# Patient Record
Sex: Male | Born: 1960 | Race: White | Smoking: Former smoker
Health system: Southern US, Community
[De-identification: ages and names within clinical notes are randomized; demographics above are authoritative.]

## PROBLEM LIST (undated history)

## (undated) DIAGNOSIS — I10 Essential (primary) hypertension: Secondary | ICD-10-CM

## (undated) DIAGNOSIS — H269 Unspecified cataract: Secondary | ICD-10-CM

## (undated) DIAGNOSIS — E785 Hyperlipidemia, unspecified: Secondary | ICD-10-CM

## (undated) DIAGNOSIS — E119 Type 2 diabetes mellitus without complications: Secondary | ICD-10-CM

## (undated) DIAGNOSIS — F329 Major depressive disorder, single episode, unspecified: Secondary | ICD-10-CM

## (undated) DIAGNOSIS — K219 Gastro-esophageal reflux disease without esophagitis: Secondary | ICD-10-CM

## (undated) DIAGNOSIS — F419 Anxiety disorder, unspecified: Secondary | ICD-10-CM

## (undated) HISTORY — DX: Anxiety disorder, unspecified: F41.9

## (undated) HISTORY — DX: Essential (primary) hypertension: I10

## (undated) HISTORY — DX: Gastro-esophageal reflux disease without esophagitis: K21.9

## (undated) HISTORY — DX: Type 2 diabetes mellitus without complications: E11.9

## (undated) HISTORY — DX: Hyperlipidemia, unspecified: E78.5

## (undated) HISTORY — DX: Major depressive disorder, single episode, unspecified: F32.9

## (undated) HISTORY — DX: Unspecified cataract: H26.9

---

## 1998-01-27 DIAGNOSIS — F32A Depression, unspecified: Secondary | ICD-10-CM

## 1998-01-27 DIAGNOSIS — F419 Anxiety disorder, unspecified: Secondary | ICD-10-CM

## 1998-01-27 HISTORY — DX: Anxiety disorder, unspecified: F41.9

## 1998-01-27 HISTORY — DX: Depression, unspecified: F32.A

## 1999-12-29 DIAGNOSIS — E119 Type 2 diabetes mellitus without complications: Secondary | ICD-10-CM

## 1999-12-29 HISTORY — DX: Type 2 diabetes mellitus without complications: E11.9

## 2004-02-28 DIAGNOSIS — H269 Unspecified cataract: Secondary | ICD-10-CM

## 2004-02-28 HISTORY — DX: Unspecified cataract: H26.9

## 2006-11-28 DIAGNOSIS — E785 Hyperlipidemia, unspecified: Secondary | ICD-10-CM

## 2006-11-28 HISTORY — DX: Hyperlipidemia, unspecified: E78.5

## 2015-04-14 ENCOUNTER — Ambulatory Visit (INDEPENDENT_AMBULATORY_CARE_PROVIDER_SITE_OTHER): Admitting: Family Medicine

## 2015-04-14 ENCOUNTER — Encounter: Payer: Self-pay | Admitting: Family Medicine

## 2015-04-14 VITALS — BP 118/83 | HR 65 | Temp 99.1°F | Ht 72.0 in | Wt 259.6 lb

## 2015-04-14 DIAGNOSIS — F329 Major depressive disorder, single episode, unspecified: Secondary | ICD-10-CM | POA: Insufficient documentation

## 2015-04-14 DIAGNOSIS — M5136 Other intervertebral disc degeneration, lumbar region: Secondary | ICD-10-CM | POA: Diagnosis not present

## 2015-04-14 DIAGNOSIS — E119 Type 2 diabetes mellitus without complications: Secondary | ICD-10-CM

## 2015-04-14 DIAGNOSIS — F419 Anxiety disorder, unspecified: Secondary | ICD-10-CM

## 2015-04-14 DIAGNOSIS — I10 Essential (primary) hypertension: Secondary | ICD-10-CM

## 2015-04-14 DIAGNOSIS — Z1211 Encounter for screening for malignant neoplasm of colon: Secondary | ICD-10-CM

## 2015-04-14 DIAGNOSIS — E1159 Type 2 diabetes mellitus with other circulatory complications: Secondary | ICD-10-CM | POA: Insufficient documentation

## 2015-04-14 DIAGNOSIS — R300 Dysuria: Secondary | ICD-10-CM | POA: Diagnosis not present

## 2015-04-14 DIAGNOSIS — K219 Gastro-esophageal reflux disease without esophagitis: Secondary | ICD-10-CM | POA: Diagnosis not present

## 2015-04-14 DIAGNOSIS — F3162 Bipolar disorder, current episode mixed, moderate: Secondary | ICD-10-CM | POA: Insufficient documentation

## 2015-04-14 DIAGNOSIS — J302 Other seasonal allergic rhinitis: Secondary | ICD-10-CM | POA: Insufficient documentation

## 2015-04-14 LAB — POCT UA - MICROALBUMIN: MICROALBUMIN (UR) POC: NEGATIVE mg/L

## 2015-04-14 LAB — POCT UA - MICROSCOPIC ONLY
BACTERIA, U MICROSCOPIC: NEGATIVE
Casts, Ur, LPF, POC: NEGATIVE
Crystals, Ur, HPF, POC: NEGATIVE
Mucus, UA: NEGATIVE
RBC, URINE, MICROSCOPIC: NEGATIVE
Yeast, UA: NEGATIVE

## 2015-04-14 LAB — POCT URINALYSIS DIPSTICK
Bilirubin, UA: NEGATIVE
Blood, UA: NEGATIVE
GLUCOSE UA: NEGATIVE
Ketones, UA: NEGATIVE
Leukocytes, UA: NEGATIVE
NITRITE UA: NEGATIVE
Protein, UA: NEGATIVE
Spec Grav, UA: 1.015
UROBILINOGEN UA: NEGATIVE
pH, UA: 5

## 2015-04-14 LAB — POCT GLYCOSYLATED HEMOGLOBIN (HGB A1C): HEMOGLOBIN A1C: 5.4

## 2015-04-14 MED ORDER — METFORMIN HCL 500 MG PO TABS: 500.0000 mg | ORAL_TABLET | Freq: Two times a day (BID) | ORAL | Status: DC

## 2015-04-14 MED ORDER — CYCLOBENZAPRINE HCL 10 MG PO TABS: 10.0000 mg | ORAL_TABLET | Freq: Three times a day (TID) | ORAL | Status: DC | PRN

## 2015-04-14 MED ORDER — LISINOPRIL 10 MG PO TABS: 10.0000 mg | ORAL_TABLET | Freq: Every day | ORAL | Status: DC

## 2015-04-14 MED ORDER — MELOXICAM 15 MG PO TABS: 15.0000 mg | ORAL_TABLET | Freq: Every day | ORAL | Status: DC

## 2015-04-14 MED ORDER — OMEPRAZOLE 20 MG PO CPDR: 20.0000 mg | DELAYED_RELEASE_CAPSULE | Freq: Every day | ORAL | Status: DC

## 2015-04-14 MED ORDER — CETIRIZINE HCL 10 MG PO TABS: 10.0000 mg | ORAL_TABLET | Freq: Every day | ORAL | Status: DC

## 2015-04-14 NOTE — Progress Notes (Signed)
BP 118/83 mmHg  Pulse 65  Temp(Src) 99.1 F (37.3 C) (Oral)  Ht 6' (1.829 m)  Wt 259 lb 9.6 oz (117.754 kg)  BMI 35.20 kg/m2   Subjective:    Patient ID: Adam Oneal. Floren, male    DOB: 1961/05/14, 54 y.o.   MRN: [id number]  HPI: [name] is a 54 y.o. male presenting on 04/14/2015 for Establish Care   HPI Hypertension Patient presents as a new patient to [place] today. Patient has been on lisinopril for his blood pressure and renal protectiveness for his diabetes. His blood pressure is controlled today. Patient denies headaches, blurred vision, chest pains, shortness of breath, or weakness. Denies any side effects from medication and is content with current medication.  Type 2 diabetes Patient has had type 2 diabetes for some time and is currently on metformin 500 mg twice daily. He feels like he has been controlled on that for some time. He denies any neuropathy or visual damage from diabetes. It has been at least a couple years since he has had his eyes checked by his ophthalmologist. He has not been checking his blood sugars regularly so does not know how they run currently. His A1c in office today was 5.4.  GERD Patient has been on omeprazole off and on over the past year to and feels like it has been working for him well when he is on it. He uses it for a couple months at a time when he does needed. He denies any history of ulcers or bleeding or blood in his stool. He also denies any lightheadedness or dizziness.  Back pain Patient has had known back pain and degenerative disc disease with no radiculopathy for quite a few years. He usually takes Mobic and Percocet for this. He also occasionally uses Flexeril for this as well. He denies any weakness or numbness in his feet and has put in a request for imaging for his previous provider to be sent to [place].  Bipolar Patient has had bipolar/mood disorders since he was very young. He has been on multiple different medications for this  and per his wife who is in the room he has had multiple manic episodes throughout his life that he has been up for 3 or 4 days of the time doing 1 million things and sometimes even get himself in trouble. He currently is taking lithium for this and has been controlled on his lithium. Patient does not like his lithium though and would like to discuss changes, he would like a referral to a psychiatrist that can be beneficial for him.  Dysuria Patient has had burning during urination and urinary frequency for the past 3 or 4 days. He denies any fevers or chills or flank pain. He denies any urinary incontinence. The pain only occurs with urination. He denies any testicular pain or any abdominal pain.  Relevant past medical, surgical, family and social history reviewed and updated as indicated. Interim medical history since our last visit reviewed. Allergies and medications reviewed and updated.  Review of Systems  Constitutional: Negative for fever and chills.  HENT: Negative for congestion, ear discharge and ear pain.   Eyes: Negative for pain, discharge and visual disturbance.  Respiratory: Negative for cough, chest tightness, shortness of breath and wheezing.   Cardiovascular: Negative for chest pain and leg swelling.  Gastrointestinal: Negative for nausea, vomiting, abdominal pain (controlled on omeprazole), diarrhea, constipation, blood in stool and abdominal distention.  Genitourinary: Negative for difficulty urinating.  Musculoskeletal: Positive for back pain. Negative for myalgias and gait problem.  Skin: Negative for rash.  Neurological: Negative for dizziness, tremors, syncope, weakness, light-headedness, numbness and headaches.  Psychiatric/Behavioral: Positive for behavioral problems and sleep disturbance. Negative for suicidal ideas, dysphoric mood and agitation.  All other systems reviewed and are negative.   Per HPI unless specifically indicated above  Social History   Social  History  . Marital Status: Married    Spouse Name: N/A  . Number of Children: N/A  . Years of Education: N/A   Occupational History  . Not on file.   Social History Main Topics  . Smoking status: Former Smoker -- 1.00 packs/day for 12 years    Types: Cigarettes    Start date: 01/05/1978    Quit date: 12/28/1989  . Smokeless tobacco: Not on file  . Alcohol Use: No  . Drug Use: No  . Sexual Activity: No   Other Topics Concern  . Not on file   Social History Narrative  . No narrative on file    History reviewed. No pertinent past surgical history.  Family History  Problem Relation Age of Onset  . Cancer Mother 44    lung  . Heart disease Father   . Hyperlipidemia Father   . Diabetes Brother   . Leukemia Brother   . Diabetes Paternal Grandmother   . Heart disease Paternal Grandmother   . Alcohol abuse Paternal Grandfather   . Diabetes Brother       Medication List       This list is accurate as of: 04/14/15  1:59 PM.  Always use your most recent med list.               cetirizine 10 MG tablet  Commonly known as:  ZYRTEC  Take 10 mg by mouth daily.     cyclobenzaprine 10 MG tablet  Commonly known as:  FLEXERIL  Take 10 mg by mouth 3 (three) times daily as needed for muscle spasms.     lisinopril 10 MG tablet  Commonly known as:  PRINIVIL,ZESTRIL  Take 10 mg by mouth daily.     lithium carbonate 450 MG CR tablet  Commonly known as:  ESKALITH  Take by mouth 2 (two) times daily.     meloxicam 15 MG tablet  Commonly known as:  MOBIC  Take 15 mg by mouth daily.     metFORMIN 500 MG tablet  Commonly known as:  GLUCOPHAGE  Take by mouth 2 (two) times daily with a meal.     mometasone 50 MCG/ACT nasal spray  Commonly known as:  NASONEX  Place 2 sprays into the nose daily.     omeprazole 20 MG capsule  Commonly known as:  PRILOSEC  Take 20 mg by mouth daily.     oxyCODONE-acetaminophen 5-325 MG per tablet  Commonly known as:  PERCOCET/ROXICET    Take by mouth every 8 (eight) hours as needed for severe pain.     simvastatin 20 MG tablet  Commonly known as:  ZOCOR  Take 20 mg by mouth at bedtime.           Objective:    BP 118/83 mmHg  Pulse 65  Temp(Src) 99.1 F (37.3 C) (Oral)  Ht 6' (1.829 m)  Wt 259 lb 9.6 oz (117.754 kg)  BMI 35.20 kg/m2  Wt Readings from Last 3 Encounters:  04/14/15 259 lb 9.6 oz (117.754 kg)    Physical Exam  Constitutional: He is oriented  to person, place, and time. He appears well-developed and well-nourished. No distress.  HENT:  Right Ear: External ear normal.  Left Ear: External ear normal.  Nose: Nose normal.  Mouth/Throat: Oropharynx is clear and moist.  Eyes: Conjunctivae and EOM are normal. Pupils are equal, round, and reactive to light. Right eye exhibits no discharge. No scleral icterus.  Neck: Neck supple. No thyromegaly present.  Cardiovascular: Normal rate, regular rhythm, normal heart sounds and intact distal pulses.   No murmur heard. Pulmonary/Chest: Effort normal and breath sounds normal. No respiratory distress. He has no wheezes.  Abdominal: Soft. Bowel sounds are normal. He exhibits no distension. There is no tenderness. There is no rebound and no guarding.  Musculoskeletal: Normal range of motion. He exhibits no edema.       Lumbar back: He exhibits tenderness (bilateral paraspinal, Negative straight leg raise). He exhibits normal range of motion, no swelling, no edema and normal pulse.  Lymphadenopathy:    He has no cervical adenopathy.  Neurological: He is alert and oriented to person, place, and time. Coordination normal.  Skin: Skin is warm and dry. No rash noted. He is not diaphoretic.  Psychiatric: He has a normal mood and affect. His behavior is normal.  Vitals reviewed.   No results found for this or any previous visit.    Assessment & Plan:       Problem List Items Addressed This Visit      Cardiovascular and Mediastinum   Essential hypertension,  benign - Primary   Relevant Medications   simvastatin (ZOCOR) 20 MG tablet   lisinopril (PRINIVIL,ZESTRIL) 10 MG tablet   Other Relevant Orders   Lipid panel     Respiratory   Seasonal allergies     Digestive   GERD (gastroesophageal reflux disease)   Relevant Medications   omeprazole (PRILOSEC) 20 MG capsule     Endocrine   Diabetes type 2, controlled   Relevant Medications   metFORMIN (GLUCOPHAGE) 500 MG tablet   simvastatin (ZOCOR) 20 MG tablet   lisinopril (PRINIVIL,ZESTRIL) 10 MG tablet   Other Relevant Orders   POCT glycosylated hemoglobin (Hb A1C)   POCT UA - Microalbumin   CMP14+EGFR   Ambulatory referral to Ophthalmology     Musculoskeletal and Integument   Degenerative disc disease, lumbar   Relevant Medications   meloxicam (MOBIC) 15 MG tablet   oxyCODONE-acetaminophen (PERCOCET/ROXICET) 5-325 MG per tablet   cyclobenzaprine (FLEXERIL) 10 MG tablet     Other   Bipolar 1 disorder, mixed, moderate   Relevant Orders   Thyroid Panel With TSH    Other Visit Diagnoses    Dysuria        Relevant Orders    POCT UA - Microscopic Only    Screening for colon cancer        Relevant Orders    Ambulatory referral to Gastroenterology        Follow up plan: Return in about 4 weeks (around 05/12/2015), or if symptoms worsen or fail to improve.  Caryl Pina, MD [facility] 04/14/2015, 1:59 PM

## 2015-04-15 LAB — LIPID PANEL
CHOL/HDL RATIO: 2.4 ratio (ref 0.0–5.0)
CHOLESTEROL TOTAL: 151 mg/dL (ref 100–199)
HDL: 62 mg/dL (ref >39)
LDL CALC: 60 mg/dL (ref 0–99)
TRIGLYCERIDES: 144 mg/dL (ref 0–149)
VLDL CHOLESTEROL CAL: 29 mg/dL (ref 5–40)

## 2015-04-15 LAB — CMP14+EGFR
A/G RATIO: 1.7 (ref 1.1–2.5)
ALT: 43 IU/L (ref 0–44)
AST: 23 IU/L (ref 0–40)
Albumin: 4.7 g/dL (ref 3.5–5.5)
Alkaline Phosphatase: 89 IU/L (ref 39–117)
BILIRUBIN TOTAL: 0.3 mg/dL (ref 0.0–1.2)
BUN/Creatinine Ratio: 18 (ref 9–20)
BUN: 15 mg/dL (ref 6–24)
CHLORIDE: 101 mmol/L (ref 97–108)
CO2: 21 mmol/L (ref 18–29)
Calcium: 10 mg/dL (ref 8.7–10.2)
Creatinine, Ser: 0.84 mg/dL (ref 0.76–1.27)
GFR calc Af Amer: 114 mL/min/{1.73_m2} (ref >59)
GFR calc non Af Amer: 99 mL/min/{1.73_m2} (ref >59)
GLOBULIN, TOTAL: 2.7 g/dL (ref 1.5–4.5)
Glucose: 100 mg/dL — ABNORMAL HIGH (ref 65–99)
POTASSIUM: 5.3 mmol/L — AB (ref 3.5–5.2)
SODIUM: 139 mmol/L (ref 134–144)
Total Protein: 7.4 g/dL (ref 6.0–8.5)

## 2015-04-15 LAB — THYROID PANEL WITH TSH
Free Thyroxine Index: 2.7 (ref 1.2–4.9)
T3 UPTAKE RATIO: 26 % (ref 24–39)
T4 TOTAL: 10.3 ug/dL (ref 4.5–12.0)
TSH: 2.36 u[IU]/mL (ref 0.450–4.500)

## 2015-04-18 NOTE — Patient Instructions (Signed)
Potassium Content of Foods  Potassium is a mineral found in many foods and drinks. It helps keep fluids and minerals balanced in your body and affects how steadily your heart beats. Potassium also helps control your blood pressure and keep your muscles and nervous system healthy.  Certain health conditions and medicines may change the balance of potassium in your body. When this happens, you can help balance your level of potassium through the foods that you do or do not eat. Your health care Adam Oneal or dietitian may recommend an amount of potassium that you should have each day. The following lists of foods provide the amount of potassium (in parentheses) per serving in each item.  HIGH IN POTASSIUM   The following foods and beverages have 200 mg or more of potassium per serving:  · Apricots, 2 raw or 5 dry (200 mg).  · Artichoke, 1 medium (345 mg).  · Avocado, raw,  ¼ each (245 mg).  · Banana, 1 medium (425 mg).  · Beans, lima, or baked beans, canned, ½ cup (280 mg).  · Beans, white, canned, ½ cup (595 mg).  · Beef roast, 3 oz (320 mg).  · Beef, ground, 3 oz (270 mg).  · Beets, raw or cooked, ½ cup (260 mg).  · Bran muffin, 2 oz (300 mg).  · Broccoli, ½ cup (230 mg).  · Brussels sprouts, ½ cup (250 mg).  · Cantaloupe, ½ cup (215 mg).  · Cereal, 100% bran, ½ cup (200-400 mg).  · Cheeseburger, single, fast food, 1 each (225-400 mg).  · Chicken, 3 oz (220 mg).  · Clams, canned, 3 oz (535 mg).  · Crab, 3 oz (225 mg).  · Dates, 5 each (270 mg).  · Dried beans and peas, ½ cup (300-475 mg).  · Figs, dried, 2 each (260 mg).  · Fish: halibut, tuna, cod, snapper, 3 oz (480 mg).  · Fish: salmon, haddock, swordfish, perch, 3 oz (300 mg).  · Fish, tuna, canned 3 oz (200 mg).  · French fries, fast food, 3 oz (470 mg).  · Granola with fruit and nuts, ½ cup (200 mg).  · Grapefruit juice, ½ cup (200 mg).  · Greens, beet, ½ cup (655 mg).  · Honeydew melon, ½ cup (200 mg).  · Kale, raw, 1 cup (300 mg).  · Kiwi, 1 medium (240  mg).  · Kohlrabi, rutabaga, parsnips, ½ cup (280 mg).  · Lentils, ½ cup (365 mg).  · Mango, 1 each (325 mg).  · Milk, chocolate, 1 cup (420 mg).  · Milk: nonfat, low-fat, whole, buttermilk, 1 cup (350-380 mg).  · Molasses, 1 Tbsp (295 mg).  · Mushrooms, ½ cup (280) mg.  · Nectarine, 1 each (275 mg).  · Nuts: almonds, peanuts, hazelnuts, [place], cashew, mixed, 1 oz (200 mg).  · Nuts, pistachios, 1 oz (295 mg).  · Orange, 1 each (240 mg).  · Orange juice, ½ cup (235 mg).  · Papaya, medium, ½ fruit (390 mg).  · Peanut butter, chunky, 2 Tbsp (240 mg).  · Peanut butter, smooth, 2 Tbsp (210 mg).  · Pear, 1 medium (200 mg).  · Pomegranate, 1 whole (400 mg).  · Pomegranate juice, ½ cup (215 mg).  · Pork, 3 oz (350 mg).  · Potato chips, salted, 1 oz (465 mg).  · Potato, baked with skin, 1 medium (925 mg).  · Potatoes, boiled, ½ cup (255 mg).  · Potatoes, mashed, ½ cup (330 mg).  · Prune juice, ½ cup (  370 mg).  · Prunes, 5 each (305 mg).  · Pudding, chocolate, ½ cup (230 mg).  · Pumpkin, canned, ½ cup (250 mg).  · Raisins, seedless, ¼ cup (270 mg).  · Seeds, sunflower or pumpkin, 1 oz (240 mg).  · Soy milk, 1 cup (300 mg).  · Spinach, ½ cup (420 mg).  · Spinach, canned, ½ cup (370 mg).  · Sweet potato, baked with skin, 1 medium (450 mg).  · Swiss chard, ½ cup (480 mg).  · Tomato or vegetable juice, ½ cup (275 mg).  · Tomato sauce or puree, ½ cup (400-550 mg).  · Tomato, raw, 1 medium (290 mg).  · Tomatoes, canned, ½ cup (200-300 mg).  · Turkey, 3 oz (250 mg).  · Wheat germ, 1 oz (250 mg).  · Winter squash, ½ cup (250 mg).  · Yogurt, plain or fruited, 6 oz (260-435 mg).  · Zucchini, ½ cup (220 mg).  MODERATE IN POTASSIUM  The following foods and beverages have 50-200 mg of potassium per serving:  · Apple, 1 each (150 mg).  · Apple juice, ½ cup (150 mg).  · Applesauce, ½ cup (90 mg).  · Apricot nectar, ½ cup (140 mg).  · Asparagus, small spears, ½ cup or 6 spears (155 mg).  · Bagel, cinnamon raisin, 1 each (130 mg).  · Bagel,  egg or plain, 4 in., 1 each (70 mg).  · Beans, green, ½ cup (90 mg).  · Beans, yellow, ½ cup (190 mg).  · Beer, regular, 12 oz (100 mg).  · Beets, canned, ½ cup (125 mg).  · Blackberries, ½ cup (115 mg).  · Blueberries, ½ cup (60 mg).  · Bread, whole wheat, 1 slice (70 mg).  · Broccoli, raw, ½ cup (145 mg).  · Cabbage, ½ cup (150 mg).  · Carrots, cooked or raw, ½ cup (180 mg).  · Cauliflower, raw, ½ cup (150 mg).  · Celery, raw, ½ cup (155 mg).  · Cereal, bran flakes, ½cup (120-150 mg).  · Cheese, cottage, ½ cup (110 mg).  · Cherries, 10 each (150 mg).  · Chocolate, 1½ oz bar (165 mg).  · Coffee, brewed 6 oz (90 mg).  · Corn, ½ cup or 1 ear (195 mg).  · Cucumbers, ½ cup (80 mg).  · Egg, large, 1 each (60 mg).  · Eggplant, ½ cup (60 mg).  · Endive, raw, ½cup (80 mg).  · English muffin, 1 each (65 mg).  · Fish, orange roughy, 3 oz (150 mg).  · Frankfurter, beef or pork, 1 each (75 mg).  · Fruit cocktail, ½ cup (115 mg).  · Grape juice, ½ cup (170 mg).  · Grapefruit, ½ fruit (175 mg).  · Grapes, ½ cup (155 mg).  · Greens: kale, turnip, collard, ½ cup (110-150 mg).  · Ice cream or frozen yogurt, chocolate, ½ cup (175 mg).  · Ice cream or frozen yogurt, vanilla, ½ cup (120-150 mg).  · Lemons, limes, 1 each (80 mg).  · Lettuce, all types, 1 cup (100 mg).  · Mixed vegetables, ½ cup (150 mg).  · Mushrooms, raw, ½ cup (110 mg).  · Nuts: walnuts, pecans, or macadamia, 1 oz (125 mg).  · Oatmeal, ½ cup (80 mg).  · Okra, ½ cup (110 mg).  · Onions, raw, ½ cup (120 mg).  · Peach, 1 each (185 mg).  · Peaches, canned, ½ cup (120 mg).  · Pears, canned, ½ cup (120 mg).  · Peas, green,   frozen, ½ cup (90 mg).  · Peppers, green, ½ cup (130 mg).  · Peppers, red, ½ cup (160 mg).  · Pineapple juice, ½ cup (165 mg).  · Pineapple, fresh or canned, ½ cup (100 mg).  · Plums, 1 each (105 mg).  · Pudding, vanilla, ½ cup (150 mg).  · Raspberries, ½ cup (90 mg).  · Rhubarb, ½ cup (115 mg).  · Rice, wild, ½ cup (80 mg).  · Shrimp, 3 oz (155  mg).  · Spinach, raw, 1 cup (170 mg).  · Strawberries, ½ cup (125 mg).  · Summer squash ½ cup (175-200 mg).  · Swiss chard, raw, 1 cup (135 mg).  · Tangerines, 1 each (140 mg).  · Tea, brewed, 6 oz (65 mg).  · Turnips, ½ cup (140 mg).  · Watermelon, ½ cup (85 mg).  · Wine, red, table, 5 oz (180 mg).  · Wine, white, table, 5 oz (100 mg).  LOW IN POTASSIUM  The following foods and beverages have less than 50 mg of potassium per serving.  · Bread, white, 1 slice (30 mg).  · Carbonated beverages, 12 oz (less than 5 mg).  · Cheese, 1 oz (20-30 mg).  · Cranberries, ½ cup (45 mg).  · Cranberry juice cocktail, ½ cup (20 mg).  · Fats and oils, 1 Tbsp (less than 5 mg).  · Hummus, 1 Tbsp (32 mg).  · Nectar: papaya, mango, or pear, ½ cup (35 mg).  · Rice, white or brown, ½ cup (50 mg).  · Spaghetti or macaroni, ½ cup cooked (30 mg).  · Tortilla, flour or corn, 1 each (50 mg).  · Waffle, 4 in., 1 each (50 mg).  · Water chestnuts, ½ cup (40 mg).  Document Released: 02/27/2005 Document Revised: 07/21/2013 Document Reviewed: 06/12/2013  [employer]® Patient Information ©2015 [employer]. This information is not intended to replace advice given to you by your health care Adam Oneal. Make sure you discuss any questions you have with your health care Adam Oneal.

## 2015-05-17 ENCOUNTER — Ambulatory Visit: Admitting: Family Medicine

## 2015-06-01 ENCOUNTER — Ambulatory Visit: Admitting: Family Medicine

## 2015-09-06 ENCOUNTER — Encounter: Payer: Self-pay | Admitting: Family Medicine

## 2015-09-06 ENCOUNTER — Ambulatory Visit (INDEPENDENT_AMBULATORY_CARE_PROVIDER_SITE_OTHER): Admitting: Family Medicine

## 2015-09-06 VITALS — BP 132/78 | HR 80 | Temp 99.9°F | Ht 72.0 in | Wt 262.2 lb

## 2015-09-06 DIAGNOSIS — E119 Type 2 diabetes mellitus without complications: Secondary | ICD-10-CM

## 2015-09-06 DIAGNOSIS — F3162 Bipolar disorder, current episode mixed, moderate: Secondary | ICD-10-CM | POA: Diagnosis not present

## 2015-09-06 DIAGNOSIS — M25551 Pain in right hip: Secondary | ICD-10-CM | POA: Diagnosis not present

## 2015-09-06 DIAGNOSIS — Z1211 Encounter for screening for malignant neoplasm of colon: Secondary | ICD-10-CM | POA: Diagnosis not present

## 2015-09-06 DIAGNOSIS — E785 Hyperlipidemia, unspecified: Secondary | ICD-10-CM | POA: Diagnosis not present

## 2015-09-06 DIAGNOSIS — I10 Essential (primary) hypertension: Secondary | ICD-10-CM

## 2015-09-06 DIAGNOSIS — G8929 Other chronic pain: Secondary | ICD-10-CM | POA: Diagnosis not present

## 2015-09-06 DIAGNOSIS — E1169 Type 2 diabetes mellitus with other specified complication: Secondary | ICD-10-CM | POA: Insufficient documentation

## 2015-09-06 LAB — POCT GLYCOSYLATED HEMOGLOBIN (HGB A1C): Hemoglobin A1C: 6.1

## 2015-09-06 MED ORDER — OXYCODONE-ACETAMINOPHEN 5-325 MG PO TABS: 1.0000 | ORAL_TABLET | Freq: Every day | ORAL | Status: DC | PRN

## 2015-09-06 MED ORDER — ATORVASTATIN CALCIUM 20 MG PO TABS: 20.0000 mg | ORAL_TABLET | Freq: Every day | ORAL | Status: DC

## 2015-09-06 MED ORDER — LISINOPRIL 10 MG PO TABS: 10.0000 mg | ORAL_TABLET | Freq: Every day | ORAL | Status: DC

## 2015-09-06 MED ORDER — METFORMIN HCL 500 MG PO TABS: 500.0000 mg | ORAL_TABLET | Freq: Two times a day (BID) | ORAL | Status: DC

## 2015-09-06 NOTE — Progress Notes (Signed)
BP 132/78 mmHg  Pulse 80  Temp(Src) 99.9 F (37.7 C) (Oral)  Ht 6' (1.829 m)  Wt 262 lb 3.2 oz (118.933 kg)  BMI 35.55 kg/m2   Subjective:    Patient ID: Adam Oneal. Ahr, male    DOB: 06-15-1961, 55 y.o.   MRN: [id number]  HPI: Adam Oneal. [name] is a 55 y.o. male presenting on 09/06/2015 for Diabetes; Hyperlipidemia; and Hypertension   HPI Type 2 diabetes Patient is coming in today for a check on type 2 diabetes. He is currently using metformin 500 twice a day which was just started 3 months ago. He denies any issues with the medication and has been making dietary and lifestyle changes to help improve it. Patient denies headaches, blurred vision, chest pains, shortness of breath, or weakness. Denies any side effects from medication and is content with current medication.   Hypertension Patient is coming in today for a hypertension recheck as well. His blood pressure today is 132/78 and he is currently on lisinopril 10 mg. He denies any side effects from medication and is doing well on it.  Hyperlipidemia Patient is on atorvastatin 20 mg and is not quite due for recheck of his cholesterol just yet. He denies any myalgias or liver issues that he knows of. The medication has been working well for him.  Chronic hip pain and right Patient is coming in today for chronic right hip pain that he has had injections and x-rays and CT scans for previously. He would just like to have something that could help him through with the pain. Is also having difficulty bearing weight on that side. He is already seen a specialist for this issue and would like something to go on top of the oxycodone that they have given him to maybe help a little bit more.  Bipolar Patient has known bipolar and needs an FMLA signed so that when he gets his flareups of manic episodes or hypomanic spells that are very frequent he can be off work during those periods where he doesn't get himself in trouble or hurt somebody else.  Again these happen maybe a once or twice a year. He has not had any manic episodes the seventh had them previously since being on this medication.  Relevant past medical, surgical, family and social history reviewed and updated as indicated. Interim medical history since our last visit reviewed. Allergies and medications reviewed and updated.  Review of Systems  Constitutional: Negative for fever and chills.  HENT: Negative for ear discharge and ear pain.   Eyes: Negative for discharge and visual disturbance.  Respiratory: Negative for shortness of breath and wheezing.   Cardiovascular: Negative for chest pain and leg swelling.  Gastrointestinal: Negative for abdominal pain, diarrhea and constipation.  Genitourinary: Negative for difficulty urinating.  Musculoskeletal: Positive for myalgias, arthralgias and gait problem. Negative for back pain and joint swelling.  Skin: Negative for rash.  Neurological: Negative for dizziness, syncope, light-headedness and headaches.  All other systems reviewed and are negative.   Per HPI unless specifically indicated above     Medication List       This list is accurate as of: 09/06/15 10:57 AM.  Always use your most recent med list.               atorvastatin 20 MG tablet  Commonly known as:  LIPITOR  Take 1 tablet (20 mg total) by mouth daily.     cetirizine 10 MG tablet  Commonly known as:  ZYRTEC  Take 1 tablet (10 mg total) by mouth daily.     cyclobenzaprine 10 MG tablet  Commonly known as:  FLEXERIL  Take 1 tablet (10 mg total) by mouth 3 (three) times daily as needed for muscle spasms.     lisinopril 10 MG tablet  Commonly known as:  PRINIVIL,ZESTRIL  Take 1 tablet (10 mg total) by mouth daily.     lithium carbonate 450 MG CR tablet  Commonly known as:  ESKALITH  Take by mouth 2 (two) times daily.     meloxicam 15 MG tablet  Commonly known as:  MOBIC  Take 1 tablet (15 mg total) by mouth daily.     metFORMIN 500 MG  tablet  Commonly known as:  GLUCOPHAGE  Take 1 tablet (500 mg total) by mouth 2 (two) times daily with a meal.     mometasone 50 MCG/ACT nasal spray  Commonly known as:  NASONEX  Place 2 sprays into the nose daily.     omeprazole 20 MG capsule  Commonly known as:  PRILOSEC  Take 1 capsule (20 mg total) by mouth daily.     oxyCODONE-acetaminophen 5-325 MG tablet  Commonly known as:  PERCOCET/ROXICET  Take 1 tablet by mouth daily as needed for severe pain.           Objective:    BP 132/78 mmHg  Pulse 80  Temp(Src) 99.9 F (37.7 C) (Oral)  Ht 6' (1.829 m)  Wt 262 lb 3.2 oz (118.933 kg)  BMI 35.55 kg/m2  Wt Readings from Last 3 Encounters:  09/06/15 262 lb 3.2 oz (118.933 kg)  04/14/15 259 lb 9.6 oz (117.754 kg)    Physical Exam  Constitutional: He is oriented to person, place, and time. He appears well-developed and well-nourished. No distress.  Eyes: Conjunctivae and EOM are normal. Pupils are equal, round, and reactive to light. Right eye exhibits no discharge. No scleral icterus.  Neck: Neck supple. No thyromegaly present.  Cardiovascular: Normal rate, regular rhythm, normal heart sounds and intact distal pulses.   No murmur heard. Pulmonary/Chest: Effort normal and breath sounds normal. No respiratory distress. He has no wheezes.  Musculoskeletal: Normal range of motion. He exhibits no edema.       Right hip: He exhibits tenderness (mild tenderness to palpation over the IT band. Patient says is not as flared up as it usually is today.). He exhibits normal range of motion, normal strength, no bony tenderness, no swelling and no crepitus.       Lumbar back: He exhibits tenderness (Mild tenderness in right lower lumbar and sacral region over the musculature. No spinal tenderness). He exhibits normal range of motion and no bony tenderness.  Lymphadenopathy:    He has no cervical adenopathy.  Neurological: He is alert and oriented to person, place, and time. Coordination  normal.  Skin: Skin is warm and dry. No rash noted. He is not diaphoretic.  Psychiatric: He has a normal mood and affect. His behavior is normal.  Vitals reviewed.  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      Results for orders placed or performed in visit on 09/06/15  POCT glycosylated hemoglobin (Hb A1C)  Result Value Ref Range   Hemoglobin A1C 6.1       Assessment & Plan:   Problem List Items Addressed This Visit      Cardiovascular and Mediastinum   Essential hypertension, benign   Relevant Medications   atorvastatin (LIPITOR) 20 MG tablet   lisinopril (PRINIVIL,ZESTRIL) 10 MG tablet     Endocrine   Type 2 diabetes mellitus without complication, without long-term current use of insulin (HCC) - Primary    A1c went up from 5.4 to 6.1, will monitor no change in medications for now      Relevant Medications   atorvastatin (LIPITOR) 20 MG tablet   lisinopril (PRINIVIL,ZESTRIL) 10 MG tablet   metFORMIN (GLUCOPHAGE) 500 MG tablet   Other Relevant Orders   POCT glycosylated hemoglobin (Hb A1C) (Completed)   Ambulatory referral to Ophthalmology     Other   Bipolar 1 disorder, mixed, moderate (HCC)    We'll sign FMLA for bipolar because patient occasionally needs days off when he has hypomanic spells, mostly controlled but will sign FMLA paperwork      Hyperlipidemia LDL goal <100   Relevant Medications   atorvastatin (LIPITOR) 20 MG tablet   lisinopril  (PRINIVIL,ZESTRIL) 10 MG tablet    Other Visit Diagnoses    Screen for colon cancer        Relevant Orders    Ambulatory referral to Gastroenterology    Chronic hip pain, right        Likely chronic bursitis versus ITB syndrome    Relevant Medications    oxyCODONE-acetaminophen (PERCOCET/ROXICET) 5-325 MG tablet        Follow up plan: Return in about 3 months (around 12/04/2015), or if symptoms worsen or fail to improve, for Recheck diabetes.  Counseling provided for all of the vaccine components Orders Placed This Encounter  Procedures  . Ambulatory referral to Gastroenterology  . Ambulatory referral to Ophthalmology  . POCT glycosylated hemoglobin (Hb A1C)    Arville Care, MD [facility] 09/06/2015, 10:57 AM

## 2015-09-06 NOTE — Assessment & Plan Note (Signed)
We'll sign FMLA for bipolar because patient occasionally needs days off when he has hypomanic spells, mostly controlled but will sign FMLA paperwork

## 2015-09-06 NOTE — Assessment & Plan Note (Signed)
A1c went up from 5.4 to 6.1, will monitor no change in medications for now

## 2015-09-26 DIAGNOSIS — Z0289 Encounter for other administrative examinations: Secondary | ICD-10-CM

## 2015-09-27 LAB — HM DIABETES EYE EXAM

## 2015-10-28 ENCOUNTER — Telehealth: Payer: Self-pay | Admitting: Family Medicine

## 2015-10-28 NOTE — Telephone Encounter (Signed)
Pt was advised of MD feedback and voiced understanding. Pt has an appt with Dr.Dettinger 4/4 and will discuss everything with him at that time.

## 2015-10-28 NOTE — Telephone Encounter (Signed)
I do not recommend the Percocet long-term, if he still having a lot of pain he come in and see me and we can discuss other options. The lithium is also not something I will prescribe long-term he is supposed to get into psychiatry. If he needs a refill for one more month until he can get into psychiatry than I am okay with that long-term I will not prescribe the lithium. Arville CareJoshua Dettinger, MD [facility] 10/28/2015, 4:33 PM

## 2015-11-01 ENCOUNTER — Ambulatory Visit: Admitting: Family Medicine

## 2015-11-02 ENCOUNTER — Encounter: Payer: Self-pay | Admitting: Family Medicine

## 2015-11-02 ENCOUNTER — Ambulatory Visit (INDEPENDENT_AMBULATORY_CARE_PROVIDER_SITE_OTHER): Admitting: Family Medicine

## 2015-11-02 VITALS — BP 136/87 | HR 69 | Temp 98.8°F | Ht 72.0 in | Wt 255.2 lb

## 2015-11-02 DIAGNOSIS — IMO0002 Reserved for concepts with insufficient information to code with codable children: Secondary | ICD-10-CM

## 2015-11-02 DIAGNOSIS — L03221 Cellulitis of neck: Secondary | ICD-10-CM | POA: Diagnosis not present

## 2015-11-02 DIAGNOSIS — L0211 Cutaneous abscess of neck: Secondary | ICD-10-CM

## 2015-11-02 MED ORDER — SULFAMETHOXAZOLE-TRIMETHOPRIM 800-160 MG PO TABS: 1.0000 | ORAL_TABLET | Freq: Two times a day (BID) | ORAL | Status: DC

## 2015-11-02 NOTE — Progress Notes (Signed)
BP 136/87 mmHg  Pulse 69  Temp(Src) 98.8 F (37.1 C) (Oral)  Ht 6' (1.829 m)  Wt 255 lb 3.2 oz (115.758 kg)  BMI 34.60 kg/m2   Subjective:    Patient ID: Adam Oneal, male    DOB: [birth year], 55 y.o.   MRN: [id number]  HPI: Adam PouRobert S. Doroteo Oneal is a 55 y.o. male presenting on 11/02/2015 for Abcess on neck   HPI Abscess and skin infection on neck Patient has had an abscess developing over the past 2 weeks on his right neck. The abscess has slowly grown in size and has become more tender and painful. He tried using some topical antibiotic on it and needs tried to squeeze it and use warm compresses but has been unable to get it to drain. He denies any fevers or chills. He has had a few small ones before but has always been able to treat them on his own and this is the first time it is gotten as big as it is currently.  Relevant past medical, surgical, family and social history reviewed and updated as indicated. Interim medical history since our last visit reviewed. Allergies and medications reviewed and updated.  Review of Systems  Constitutional: Negative for fever.  HENT: Negative for ear discharge and ear pain.   Eyes: Negative for discharge and visual disturbance.  Respiratory: Negative for shortness of breath and wheezing.   Cardiovascular: Negative for chest pain and leg swelling.  Gastrointestinal: Negative for abdominal pain, diarrhea and constipation.  Genitourinary: Negative for difficulty urinating.  Musculoskeletal: Negative for back pain and gait problem.  Skin: Positive for color change. Negative for rash.  Neurological: Negative for syncope, light-headedness and headaches.  All other systems reviewed and are negative.   Per HPI unless specifically indicated above     Medication List       This list is accurate as of: 11/02/15 10:51 AM.  Always use your most recent med list.               atorvastatin 20 MG tablet  Commonly known as:  LIPITOR  Take 1  tablet (20 mg total) by mouth daily.     cetirizine 10 MG tablet  Commonly known as:  ZYRTEC  Take 1 tablet (10 mg total) by mouth daily.     cyclobenzaprine 10 MG tablet  Commonly known as:  FLEXERIL  Take 1 tablet (10 mg total) by mouth 3 (three) times daily as needed for muscle spasms.     lisinopril 10 MG tablet  Commonly known as:  PRINIVIL,ZESTRIL  Take 1 tablet (10 mg total) by mouth daily.     lithium carbonate 450 MG CR tablet  Commonly known as:  ESKALITH  Take by mouth 2 (two) times daily.     meloxicam 15 MG tablet  Commonly known as:  MOBIC  Take 1 tablet (15 mg total) by mouth daily.     metFORMIN 500 MG tablet  Commonly known as:  GLUCOPHAGE  Take 1 tablet (500 mg total) by mouth 2 (two) times daily with a meal.     mometasone 50 MCG/ACT nasal spray  Commonly known as:  NASONEX  Place 2 sprays into the nose daily.     omeprazole 20 MG capsule  Commonly known as:  PRILOSEC  Take 1 capsule (20 mg total) by mouth daily.     sulfamethoxazole-trimethoprim 800-160 MG tablet  Commonly known as:  BACTRIM DS,SEPTRA DS  Take 1 tablet by mouth 2 (two)  times daily.           Objective:    BP 136/87 mmHg  Pulse 69  Temp(Src) 98.8 F (37.1 C) (Oral)  Ht 6' (1.829 m)  Wt 255 lb 3.2 oz (115.758 kg)  BMI 34.60 kg/m2  Wt Readings from Last 3 Encounters:  11/02/15 255 lb 3.2 oz (115.758 kg)  09/06/15 262 lb 3.2 oz (118.933 kg)  04/14/15 259 lb 9.6 oz (117.754 kg)    Physical Exam  Constitutional: He is oriented to person, place, and time. He appears well-developed and well-nourished. No distress.  Eyes: Conjunctivae and EOM are normal. Pupils are equal, round, and reactive to light. Right eye exhibits no discharge. No scleral icterus.  Neck: Neck supple. No thyromegaly present.  Cardiovascular: Normal rate, regular rhythm, normal heart sounds and intact distal pulses.   No murmur heard. Pulmonary/Chest: Effort normal and breath sounds normal. No respiratory  distress. He has no wheezes.  Musculoskeletal: Normal range of motion. He exhibits no edema.  Lymphadenopathy:    He has no cervical adenopathy.  Neurological: He is alert and oriented to person, place, and time. Coordination normal.  Skin: Skin is warm and dry. Lesion (2 cm wide and 1 cm elevated area that is erythematous with fluctuation underneath. Very tender to palpation. No drainage currently) noted. No rash noted. He is not diaphoretic.  Psychiatric: He has a normal mood and affect. His behavior is normal.  Vitals reviewed.  I&D:  Incision was made on central aspect of the wound. Significant serosanguineous and purulent drainage was exuded. Culture was taken. Forceps was used to probe the area and break apart any loculations.  Simple dressing was placed over top. Bleeding was minimal and patient tolerated procedure well.    Assessment & Plan:   Problem List Items Addressed This Visit    None    Visit Diagnoses    Abscess or cellulitis, neck    -  Primary    Did I&D of abscess, send antibiotic    Relevant Medications    sulfamethoxazole-trimethoprim (BACTRIM DS,SEPTRA DS) 800-160 MG tablet    Other Relevant Orders    Anaerobic and Aerobic Culture       Follow up plan: Return if symptoms worsen or fail to improve.  Counseling provided for all of the vaccine components No orders of the defined types were placed in this encounter.    Arville Care, MD [facility] 11/02/2015, 10:51 AM

## 2015-11-06 LAB — ANAEROBIC AND AEROBIC CULTURE

## 2015-11-10 ENCOUNTER — Telehealth: Payer: Self-pay | Admitting: Family Medicine

## 2015-11-10 ENCOUNTER — Other Ambulatory Visit: Payer: Self-pay

## 2015-11-10 MED ORDER — OMEPRAZOLE 20 MG PO CPDR: 20.0000 mg | DELAYED_RELEASE_CAPSULE | Freq: Every day | ORAL | Status: DC

## 2015-12-12 ENCOUNTER — Ambulatory Visit (INDEPENDENT_AMBULATORY_CARE_PROVIDER_SITE_OTHER): Admitting: Family Medicine

## 2015-12-12 ENCOUNTER — Encounter: Payer: Self-pay | Admitting: Family Medicine

## 2015-12-12 VITALS — BP 136/86 | HR 75 | Temp 98.7°F | Ht 72.0 in | Wt 257.0 lb

## 2015-12-12 DIAGNOSIS — E785 Hyperlipidemia, unspecified: Secondary | ICD-10-CM

## 2015-12-12 DIAGNOSIS — M1990 Unspecified osteoarthritis, unspecified site: Secondary | ICD-10-CM

## 2015-12-12 DIAGNOSIS — F418 Other specified anxiety disorders: Secondary | ICD-10-CM | POA: Diagnosis not present

## 2015-12-12 DIAGNOSIS — F329 Major depressive disorder, single episode, unspecified: Secondary | ICD-10-CM

## 2015-12-12 DIAGNOSIS — E119 Type 2 diabetes mellitus without complications: Secondary | ICD-10-CM | POA: Diagnosis not present

## 2015-12-12 DIAGNOSIS — F32A Depression, unspecified: Secondary | ICD-10-CM

## 2015-12-12 DIAGNOSIS — I10 Essential (primary) hypertension: Secondary | ICD-10-CM

## 2015-12-12 DIAGNOSIS — F419 Anxiety disorder, unspecified: Secondary | ICD-10-CM

## 2015-12-12 DIAGNOSIS — M1611 Unilateral primary osteoarthritis, right hip: Secondary | ICD-10-CM

## 2015-12-12 NOTE — Progress Notes (Signed)
BP 136/86 mmHg  Pulse 75  Temp(Src) 98.7 F (37.1 C) (Oral)  Ht 6' (1.829 m)  Wt 257 lb (116.574 kg)  BMI 34.85 kg/m2   Subjective:    Patient ID: Adam Oneal. Mabry, male    DOB: September 17, 1960, 55 y.o.   MRN: [id number]  HPI: Adam Oneal. Ham is a 55 y.o. male presenting on 12/12/2015 for Diabetes; Hypertension; and Hyperlipidemia   HPI Hypertension Patient is coming in for hypertension recheck today. His blood pressure is 136/86. He is currently taking lisinopril 10 mg. He denies any issues with medication. Patient denies headaches, blurred vision, chest pains, shortness of breath, or weakness. Denies any side effects from medication and is content with current medication.   Type 2 diabetes Patient is coming in for recheck of his type 2 diabetes today. He is currently on metformin 500 twice a day. His hemoglobin A1c and February was 6.1. We will recheck again today. He had a diabetic eye exam on 07/26/2016 which showed no retinopathy. He denies any new issues with his feet. He is currently on an ACE inhibitor. Patient is also been prescribed a statin but he says he has not taken it. He does not say why he is not taking it but just that he is not. He did not have any issues or side effects from it.  Hyperlipidemia Patient is coming in for a cholesterol recheck today. He is not taking his Lipitor for at least the last couple months. He says he didn't have any issues like myalgias or aches or pains from that he just stopped taking it because he didn't know that it was necessarily helping him. We will do a recheck of her cholesterol today and see where his affect and go from there.  Anxiety depression Patient is currently on lithium for his anxiety and depression and bipolar and feels like it is doing okay for him but not as good as it was previously. He is going to see his psychiatrist soon and will bring that up with them.  Right hip pain Patient has been having right hip pain that is worse  with prolonged ambulation and going up and down stairs. He also wakes up with a stiff in the morning but gets better in 10-15 minutes as a ghost of the day. He denies any fevers or chills or numbness or weakness from the hip.  Relevant past medical, surgical, family and social history reviewed and updated as indicated. Interim medical history since our last visit reviewed. Allergies and medications reviewed and updated.  Review of Systems  Constitutional: Negative for fever and chills.  HENT: Negative for ear discharge and ear pain.   Eyes: Negative for discharge and visual disturbance.  Respiratory: Negative for shortness of breath and wheezing.   Cardiovascular: Negative for chest pain and leg swelling.  Gastrointestinal: Negative for abdominal pain, diarrhea and constipation.  Genitourinary: Negative for difficulty urinating.  Musculoskeletal: Positive for arthralgias. Negative for back pain, joint swelling and gait problem.  Skin: Negative for color change and rash.  Neurological: Negative for syncope, light-headedness and headaches.  Psychiatric/Behavioral: Positive for sleep disturbance, dysphoric mood and decreased concentration. Negative for suicidal ideas and self-injury. The patient is nervous/anxious.   All other systems reviewed and are negative.   Per HPI unless specifically indicated above     Medication List       This list is accurate as of: 12/12/15 11:26 AM.  Always use your most recent med list.  atorvastatin 20 MG tablet  Commonly known as:  LIPITOR  Take 1 tablet (20 mg total) by mouth daily.     cetirizine 10 MG tablet  Commonly known as:  ZYRTEC  Take 1 tablet (10 mg total) by mouth daily.     cyclobenzaprine 10 MG tablet  Commonly known as:  FLEXERIL  Take 1 tablet (10 mg total) by mouth 3 (three) times daily as needed for muscle spasms.     lisinopril 10 MG tablet  Commonly known as:  PRINIVIL,ZESTRIL  Take 1 tablet (10 mg total) by  mouth daily.     lithium carbonate 450 MG CR tablet  Commonly known as:  ESKALITH  Take by mouth 2 (two) times daily.     meloxicam 15 MG tablet  Commonly known as:  MOBIC  Take 1 tablet (15 mg total) by mouth daily.     metFORMIN 500 MG tablet  Commonly known as:  GLUCOPHAGE  Take 1 tablet (500 mg total) by mouth 2 (two) times daily with a meal.     mometasone 50 MCG/ACT nasal spray  Commonly known as:  NASONEX  Place 2 sprays into the nose daily.     omeprazole 20 MG capsule  Commonly known as:  PRILOSEC  Take 1 capsule (20 mg total) by mouth daily.           Objective:    BP 136/86 mmHg  Pulse 75  Temp(Src) 98.7 F (37.1 C) (Oral)  Ht 6' (1.829 m)  Wt 257 lb (116.574 kg)  BMI 34.85 kg/m2  Wt Readings from Last 3 Encounters:  12/12/15 257 lb (116.574 kg)  11/02/15 255 lb 3.2 oz (115.758 kg)  09/06/15 262 lb 3.2 oz (118.933 kg)    Physical Exam  Constitutional: He is oriented to person, place, and time. He appears well-developed and well-nourished. No distress.  Eyes: Conjunctivae and EOM are normal. Pupils are equal, round, and reactive to light. Right eye exhibits no discharge. No scleral icterus.  Neck: Neck supple. No thyromegaly present.  Cardiovascular: Normal rate, regular rhythm, normal heart sounds and intact distal pulses.   No murmur heard. Pulmonary/Chest: Effort normal and breath sounds normal. No respiratory distress. He has no wheezes.  Musculoskeletal: Normal range of motion. He exhibits tenderness. He exhibits no edema.       Left hip: He exhibits tenderness (Tenderness with external rotation felt the anterior aspect of the hip. Negative straight leg raise, no elicited back tenderness). He exhibits normal range of motion, normal strength, no bony tenderness, no swelling, no crepitus and no deformity.  Lymphadenopathy:    He has no cervical adenopathy.  Neurological: He is alert and oriented to person, place, and time. He displays normal reflexes.  He exhibits normal muscle tone. Coordination normal.  Skin: Skin is warm and dry. No rash noted. He is not diaphoretic.  Psychiatric: His behavior is normal. Judgment and thought content normal. His mood appears anxious. He exhibits a depressed mood.  Vitals reviewed.   Results for orders placed or performed in visit on 11/02/15  Anaerobic and Aerobic Culture  Result Value Ref Range   Anaerobic Culture Final report    Result 1 Comment    Aerobic Culture Final report    Result 1 Routine flora       Assessment & Plan:   Problem List Items Addressed This Visit      Cardiovascular and Mediastinum   Essential hypertension, benign - Primary   Relevant Orders   CMP14+EGFR  Microalbumin / creatinine urine ratio     Endocrine   Type 2 diabetes mellitus without complication, without long-term current use of insulin (HCC)   Relevant Orders   CMP14+EGFR   Lipid panel   Bayer DCA Hb A1c Waived   Microalbumin / creatinine urine ratio     Other   Anxiety and depression   Relevant Orders   Ambulatory referral to Psychiatry   Hyperlipidemia LDL goal <100   Relevant Orders   Lipid panel    Other Visit Diagnoses    Arthritis of right hip        Relevant Orders    Ambulatory referral to Orthopedic Surgery        Follow up plan: Return in about 3 months (around 03/13/2016), or if symptoms worsen or fail to improve, for Recheck diabetes and cholesterol and blood pressure.  Counseling provided for all of the vaccine components Orders Placed This Encounter  Procedures  . CMP14+EGFR  . Lipid panel  . Bayer DCA Hb A1c Waived  . Microalbumin / creatinine urine ratio  . Ambulatory referral to Orthopedic Surgery  . Ambulatory referral to Psychiatry    Caryl Pina, MD [facility] 12/12/2015, 11:26 AM

## 2015-12-27 ENCOUNTER — Telehealth: Payer: Self-pay | Admitting: Family Medicine

## 2015-12-27 DIAGNOSIS — F418 Other specified anxiety disorders: Secondary | ICD-10-CM

## 2015-12-29 NOTE — Telephone Encounter (Signed)
[facility] [place]

## 2016-01-06 ENCOUNTER — Telehealth (HOSPITAL_COMMUNITY): Payer: Self-pay | Admitting: *Deleted

## 2016-01-06 NOTE — Telephone Encounter (Signed)
phone call patient's wife answered, said he is not available.

## 2016-01-11 ENCOUNTER — Other Ambulatory Visit: Payer: Self-pay | Admitting: Family Medicine

## 2016-01-16 ENCOUNTER — Other Ambulatory Visit: Payer: Self-pay | Admitting: Family Medicine

## 2016-01-16 DIAGNOSIS — E785 Hyperlipidemia, unspecified: Secondary | ICD-10-CM

## 2016-01-16 DIAGNOSIS — I10 Essential (primary) hypertension: Secondary | ICD-10-CM

## 2016-01-16 DIAGNOSIS — F3162 Bipolar disorder, current episode mixed, moderate: Secondary | ICD-10-CM

## 2016-01-16 DIAGNOSIS — E119 Type 2 diabetes mellitus without complications: Secondary | ICD-10-CM

## 2016-01-16 NOTE — Telephone Encounter (Signed)
I can do the refill if he does his labs prior to getting the refill.    CMP14+EGFR  . Lipid panel  . Bayer DCA Hb A1c Waived  . Microalbumin / creatinine urine ratio  . Ambulatory referral to Orthopedic Surgery  . Ambulatory referral to Psychiatry         Also add on a thyroid check, diagnosis bipolar/because he is on lithium  , MD [facility] 01/16/2016, 10:52 AM    

## 2016-01-16 NOTE — Telephone Encounter (Signed)
Wife wants to know if we should also draw lithium level. Patient finally has appointment but it is not until August. Please advise

## 2016-01-16 NOTE — Addendum Note (Signed)
Addended by: Tamera PuntWRAY, Tyeson Tanimoto S on: 01/16/2016 01:20 PM   Modules accepted: Orders

## 2016-01-16 NOTE — Telephone Encounter (Signed)
Yes go ahead and draw lithium level as well

## 2016-01-16 NOTE — Telephone Encounter (Signed)
Orders placed.

## 2016-01-17 ENCOUNTER — Other Ambulatory Visit

## 2016-01-18 ENCOUNTER — Other Ambulatory Visit (INDEPENDENT_AMBULATORY_CARE_PROVIDER_SITE_OTHER)

## 2016-01-18 DIAGNOSIS — F3162 Bipolar disorder, current episode mixed, moderate: Secondary | ICD-10-CM

## 2016-01-18 DIAGNOSIS — E119 Type 2 diabetes mellitus without complications: Secondary | ICD-10-CM

## 2016-01-18 DIAGNOSIS — E785 Hyperlipidemia, unspecified: Secondary | ICD-10-CM

## 2016-01-18 LAB — BAYER DCA HB A1C WAIVED: HB A1C: 6.1 % (ref <7.0)

## 2016-01-19 ENCOUNTER — Telehealth: Payer: Self-pay | Admitting: Family Medicine

## 2016-01-19 LAB — SPECIMEN STATUS

## 2016-01-19 LAB — LIPID PANEL
CHOLESTEROL TOTAL: 146 mg/dL (ref 100–199)
Chol/HDL Ratio: 2.7 ratio units (ref 0.0–5.0)
HDL: 55 mg/dL (ref >39)
LDL CALC: 72 mg/dL (ref 0–99)
TRIGLYCERIDES: 96 mg/dL (ref 0–149)
VLDL Cholesterol Cal: 19 mg/dL (ref 5–40)

## 2016-01-19 LAB — THYROID PANEL WITH TSH
FREE THYROXINE INDEX: 2.3 (ref 1.2–4.9)
T3 Uptake Ratio: 28 % (ref 24–39)
T4, Total: 8.1 ug/dL (ref 4.5–12.0)
TSH: 1.11 u[IU]/mL (ref 0.450–4.500)

## 2016-01-19 LAB — CMP14+EGFR
ALK PHOS: 86 IU/L (ref 39–117)
ALT: 23 IU/L (ref 0–44)
AST: 28 IU/L (ref 0–40)
Albumin/Globulin Ratio: 1.7 (ref 1.2–2.2)
Albumin: 4 g/dL (ref 3.5–5.5)
BUN/Creatinine Ratio: 16 (ref 9–20)
BUN: 13 mg/dL (ref 6–24)
Bilirubin Total: 0.5 mg/dL (ref 0.0–1.2)
CO2: 22 mmol/L (ref 18–29)
CREATININE: 0.81 mg/dL (ref 0.76–1.27)
Calcium: 9.5 mg/dL (ref 8.7–10.2)
Chloride: 107 mmol/L — ABNORMAL HIGH (ref 96–106)
GFR calc Af Amer: 115 mL/min/{1.73_m2} (ref >59)
GFR calc non Af Amer: 99 mL/min/{1.73_m2} (ref >59)
GLOBULIN, TOTAL: 2.4 g/dL (ref 1.5–4.5)
Glucose: 107 mg/dL — ABNORMAL HIGH (ref 65–99)
POTASSIUM: 5 mmol/L (ref 3.5–5.2)
SODIUM: 142 mmol/L (ref 134–144)
Total Protein: 6.4 g/dL (ref 6.0–8.5)

## 2016-01-19 LAB — MICROALBUMIN / CREATININE URINE RATIO
CREATININE, UR: 136 mg/dL
MICROALB/CREAT RATIO: 11.5 mg/g creat (ref 0.0–30.0)
Microalbumin, Urine: 15.6 ug/mL

## 2016-01-19 LAB — LITHIUM LEVEL: LITHIUM LVL: 0.6 mmol/L (ref 0.6–1.2)

## 2016-01-19 MED ORDER — LITHIUM CARBONATE ER 450 MG PO TBCR: 450.0000 mg | EXTENDED_RELEASE_TABLET | Freq: Two times a day (BID) | ORAL | Status: DC

## 2016-01-19 NOTE — Telephone Encounter (Signed)
Great, thanks for the extra info.   Murtis SinkSam Bradshaw, MD [facility] 01/19/2016, 10:23 AM

## 2016-01-19 NOTE — Telephone Encounter (Signed)
Recommend calling his psychiatrist first for refill. This is not a med we routinely manage.   Murtis SinkSam Bradshaw, MD [facility] 01/19/2016, 9:26 AM

## 2016-01-19 NOTE — Telephone Encounter (Signed)
Patients wife aware

## 2016-01-19 NOTE — Addendum Note (Signed)
Addended by: Tamera PuntWRAY, Baldemar Dady S on: 01/19/2016 10:44 AM   Modules accepted: Orders

## 2016-01-19 NOTE — Telephone Encounter (Addendum)
Patients appointment is not until 6/28 with psychiatry and [facility] had told him that he would fill one time if he had labs drawn. Please advise

## 2016-01-19 NOTE — Addendum Note (Signed)
Addended by: Elenora GammaBRADSHAW, Lilyth Lawyer L on: 01/19/2016 10:24 AM   Modules accepted: Orders

## 2016-02-04 ENCOUNTER — Other Ambulatory Visit: Payer: Self-pay | Admitting: Family Medicine

## 2016-02-09 ENCOUNTER — Telehealth: Payer: Self-pay | Admitting: Family Medicine

## 2016-02-09 MED ORDER — LITHIUM CARBONATE ER 450 MG PO TBCR: 450.0000 mg | EXTENDED_RELEASE_TABLET | Freq: Two times a day (BID) | ORAL | Status: DC

## 2016-02-09 NOTE — Telephone Encounter (Signed)
Go ahead and make sure he has enough to get him through August 28

## 2016-02-09 NOTE — Telephone Encounter (Signed)
Wants sent to [facility], looks like [facility] did on 01/19/16. Route to pool A

## 2016-02-09 NOTE — Telephone Encounter (Signed)
[id number] his [facility]/28 visit

## 2016-03-15 ENCOUNTER — Ambulatory Visit (HOSPITAL_COMMUNITY): Admitting: Psychiatry

## 2016-03-20 ENCOUNTER — Ambulatory Visit (INDEPENDENT_AMBULATORY_CARE_PROVIDER_SITE_OTHER): Admitting: Family Medicine

## 2016-03-20 ENCOUNTER — Encounter: Payer: Self-pay | Admitting: Family Medicine

## 2016-03-20 ENCOUNTER — Ambulatory Visit: Admitting: Family Medicine

## 2016-03-20 VITALS — BP 130/88 | HR 66 | Temp 98.0°F | Ht 72.0 in | Wt 242.8 lb

## 2016-03-20 DIAGNOSIS — E785 Hyperlipidemia, unspecified: Secondary | ICD-10-CM

## 2016-03-20 DIAGNOSIS — I10 Essential (primary) hypertension: Secondary | ICD-10-CM

## 2016-03-20 DIAGNOSIS — E119 Type 2 diabetes mellitus without complications: Secondary | ICD-10-CM | POA: Diagnosis not present

## 2016-03-20 DIAGNOSIS — F319 Bipolar disorder, unspecified: Secondary | ICD-10-CM | POA: Diagnosis not present

## 2016-03-20 LAB — BAYER DCA HB A1C WAIVED: HB A1C: 6 % (ref <7.0)

## 2016-03-20 NOTE — Progress Notes (Signed)
BP 130/88 (BP Location: Left Arm, Patient Position: Sitting, Cuff Size: Large)   Pulse 66   Temp 98 F (36.7 C) (Oral)   Ht 6' (1.829 m)   Wt 242 lb 12.8 oz (110.1 kg)   BMI 32.93 kg/m    Subjective:    Patient ID: Adam Oneal, male    DOB: 30-Oct-1960, 55 y.o.   MRN: [id number]  HPI: Adam Oneal. Adam Oneal is a 55 y.o. male presenting on 03/20/2016 for Hyperlipidemia (patient is fasting; is not taking Lipitor because of side effects it caused); Hypertension; and Diabetes   HPI Hypertension Patient is coming in today for a hypertension recheck. His blood pressure is 130/88. He is currently on lisinopril 10 mg. Patient denies headaches, blurred vision, chest pains, shortness of breath, or weakness. Denies any side effects from medication and is content with current medication.   Type 2 diabetes recheck Patient is coming in for type 2 diabetes recheck. He is currently on metformin 500 twice a day. He denies any issues with the medication. He is currently on an ACE inhibitor. Patient had a foot exam on February 2017 and had an eye exam and also February 2017.  Hyperlipidemia Patient was on Lipitor 20 mg but said that he could not tolerate it and gave him achiness and so he stopped it. He does not want to try another statin at this point.  Bipolar 1 disorder Patient is seen a psychiatrist for his bipolar disorder but the psychiatrist wanted [place] to go ahead and check the lithium levels so that they can have it before his next visit.  Relevant past medical, surgical, family and social history reviewed and updated as indicated. Interim medical history since our last visit reviewed. Allergies and medications reviewed and updated.  Review of Systems  Constitutional: Negative for chills and fever.  HENT: Negative for ear discharge and ear pain.   Eyes: Negative for discharge and visual disturbance.  Respiratory: Negative for shortness of breath and wheezing.   Cardiovascular: Negative for  chest pain and leg swelling.  Gastrointestinal: Negative for abdominal pain, constipation and diarrhea.  Genitourinary: Negative for difficulty urinating.  Musculoskeletal: Negative for back pain and gait problem.  Skin: Negative for rash.  Neurological: Negative for dizziness, syncope, light-headedness and headaches.  All other systems reviewed and are negative.   Per HPI unless specifically indicated above     Medication List       Accurate as of 03/20/16  9:18 AM. Always use your most recent med list.          atorvastatin 20 MG tablet Commonly known as:  LIPITOR Take 1 tablet (20 mg total) by mouth daily.   cetirizine 10 MG tablet Commonly known as:  ZYRTEC TAKE 1 TABLET DAILY   cyclobenzaprine 10 MG tablet Commonly known as:  FLEXERIL Take 1 tablet (10 mg total) by mouth 3 (three) times daily as needed for muscle spasms.   lisinopril 10 MG tablet Commonly known as:  PRINIVIL,ZESTRIL Take 1 tablet (10 mg total) by mouth daily.   lithium carbonate 450 MG CR tablet Commonly known as:  ESKALITH Take 1 tablet (450 mg total) by mouth 2 (two) times daily.   meloxicam 15 MG tablet Commonly known as:  MOBIC TAKE 1 TABLET DAILY   metFORMIN 500 MG tablet Commonly known as:  GLUCOPHAGE Take 1 tablet (500 mg total) by mouth 2 (two) times daily with a meal.   mometasone 50 MCG/ACT nasal spray Commonly known as:  NASONEX Place 2 sprays into the nose daily.   omeprazole 20 MG capsule Commonly known as:  PRILOSEC Take 1 capsule (20 mg total) by mouth daily.          Objective:    BP 130/88 (BP Location: Left Arm, Patient Position: Sitting, Cuff Size: Large)   Pulse 66   Temp 98 F (36.7 C) (Oral)   Ht 6' (1.829 m)   Wt 242 lb 12.8 oz (110.1 kg)   BMI 32.93 kg/m   Wt Readings from Last 3 Encounters:  03/20/16 242 lb 12.8 oz (110.1 kg)  12/12/15 257 lb (116.6 kg)  11/02/15 255 lb 3.2 oz (115.8 kg)    Physical Exam  Constitutional: He is oriented to  person, place, and time. He appears well-developed and well-nourished. No distress.  Eyes: Conjunctivae and EOM are normal. Pupils are equal, round, and reactive to light. Right eye exhibits no discharge. No scleral icterus.  Neck: Neck supple. No thyromegaly present.  Cardiovascular: Normal rate, regular rhythm, normal heart sounds and intact distal pulses.   No murmur heard. Pulmonary/Chest: Effort normal and breath sounds normal. No respiratory distress. He has no wheezes.  Musculoskeletal: Normal range of motion. He exhibits no edema.  Lymphadenopathy:    He has no cervical adenopathy.  Neurological: He is alert and oriented to person, place, and time. Coordination normal.  Skin: Skin is warm and dry. No rash noted. He is not diaphoretic.  Psychiatric: He has a normal mood and affect. His behavior is normal. Judgment and thought content normal.  Nursing note and vitals reviewed.     Assessment & Plan:   Problem List Items Addressed This Visit      Cardiovascular and Mediastinum   Essential hypertension, benign - Primary     Endocrine   Type 2 diabetes mellitus without complication, without long-term current use of insulin (HCC)   Relevant Orders   Bayer DCA Hb A1c Waived   CMP14+EGFR     Other   Hyperlipidemia LDL goal <100    Other Visit Diagnoses    Bipolar I disorder ([place])       Relevant Orders   Lithium level   CMP14+EGFR       Follow up plan: Return in about 3 months (around 06/20/2016), or if symptoms worsen or fail to improve, for Recheck diabetes and hypertension and cholesterol.  Counseling provided for all of the vaccine components Orders Placed This Encounter  Procedures  . Bayer DCA Hb A1c Waived  . Lithium level  . [place], MD [facility] 03/20/2016, 9:18 AM

## 2016-03-21 LAB — CMP14+EGFR
A/G RATIO: 1.6 (ref 1.2–2.2)
ALK PHOS: 98 IU/L (ref 39–117)
ALT: 25 IU/L (ref 0–44)
AST: 20 IU/L (ref 0–40)
Albumin: 4.5 g/dL (ref 3.5–5.5)
BUN/Creatinine Ratio: 15 (ref 9–20)
BUN: 13 mg/dL (ref 6–24)
Bilirubin Total: 0.6 mg/dL (ref 0.0–1.2)
CALCIUM: 10.1 mg/dL (ref 8.7–10.2)
CO2: 24 mmol/L (ref 18–29)
CREATININE: 0.84 mg/dL (ref 0.76–1.27)
Chloride: 100 mmol/L (ref 96–106)
GFR calc Af Amer: 113 mL/min/{1.73_m2} (ref >59)
GFR, EST NON AFRICAN AMERICAN: 98 mL/min/{1.73_m2} (ref >59)
Globulin, Total: 2.8 g/dL (ref 1.5–4.5)
Glucose: 107 mg/dL — ABNORMAL HIGH (ref 65–99)
POTASSIUM: 5.1 mmol/L (ref 3.5–5.2)
Sodium: 141 mmol/L (ref 134–144)
Total Protein: 7.3 g/dL (ref 6.0–8.5)

## 2016-03-21 LAB — LITHIUM LEVEL: LITHIUM LVL: 0.7 mmol/L (ref 0.6–1.2)

## 2016-03-26 ENCOUNTER — Ambulatory Visit (INDEPENDENT_AMBULATORY_CARE_PROVIDER_SITE_OTHER): Admitting: Psychiatry

## 2016-03-26 ENCOUNTER — Encounter (HOSPITAL_COMMUNITY): Payer: Self-pay | Admitting: Psychiatry

## 2016-03-26 VITALS — BP 119/84 | HR 125 | Ht 72.0 in | Wt 252.0 lb

## 2016-03-26 DIAGNOSIS — F3162 Bipolar disorder, current episode mixed, moderate: Secondary | ICD-10-CM

## 2016-03-26 MED ORDER — DULOXETINE HCL 60 MG PO CPEP: 60.0000 mg | ORAL_CAPSULE | Freq: Every day | ORAL | 2 refills | Status: DC

## 2016-03-26 MED ORDER — LITHIUM CARBONATE ER 450 MG PO TBCR: 900.0000 mg | EXTENDED_RELEASE_TABLET | Freq: Every day | ORAL | 2 refills | Status: DC

## 2016-03-26 NOTE — Progress Notes (Signed)
Psychiatric Initial Adult Assessment   Patient Identification: Adam Oneal MRN:  [id number] Date of Evaluation:  03/26/2016 [facility] Source[facility] Chief Complaint:   Chief Complaint    Depression; Manic Behavior; Anxiety; Establish Care     Visit Diagnosis:    ICD-9-CM ICD-10-CM   1. Bipolar 1 disorder, mixed, moderate (HCC) 296.62 F31.62     History of Present Illness:  This patient is a 55 year old married white male who lives with his wife in [place]. 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 He moved here 10 months ago from [place]. The couple have no children but recently took in a 55 year old male who needed a place to live.  The patient was referred by his primary care provider from [place] [facility] for further assessment and treatment of bipolar disorder.  The patient is somewhat of a vague historian and isn't quite sure how he got diagnosed with bipolar disorder. He states that he has had "mood swings" all his life. He went into the [employer] at 55 years of age and got out 29 years later. He states that his mood was up and down throughout the whole time but he dealt with that by drinking quite a bit of alcohol as well as caffeine during the day. 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 His moods are "up and down" he states that at times he does have symptoms of overt mania such as inability to sleep and hypersexuality.  In the fall and winter he becomes more depressed and tired with low energy and motivation. He has been on lithium for a number of years in his current level is 0.6. Since he moved here he has had to work the night shift for the first time in his life. He has difficulty sleeping during the day and also doesn't remember to take off his medications. He states that he tends to obsess and wants his house spotlessly clean. At times he  rants and raves about situations at work but would never really hurt anybody or himself. He's never had psychiatric inpatient treatment. His wife describes him as "very anxious" and unhappy with his job. He calls in sick about once a month. He has no friends and doesn't really trust other people. 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  Consequences of Substance Abuse: NA  Past Medical History:  Past Medical History:  Diagnosis Date  . Anxiety 01/1998  . Cataract 02/2004  . Depression 01/1998  . Diabetes mellitus without complication ([facility]) 62/7035  . Diabetes mellitus, type II ([place])   . Hyperlipidemia 11/2006  . Hypertension   . Hypertension    History reviewed. No pertinent surgical history.  Family Psychiatric History: He  describes his father as "very OCD" he denies any other mental illness in the family  Family History:  Family History  Problem Relation Age of Onset  . Cancer Mother 74    lung  . Heart disease Father   . Hyperlipidemia Father   . Diabetes Brother   . Leukemia Brother   . Diabetes Paternal Grandmother   . Heart disease Paternal Grandmother   . Alcohol abuse Paternal Grandfather   . Diabetes Brother   . Depression Maternal Aunt     Social History:   Social  History   Social History  . Marital status: Married    Spouse name: N/A  . Number of children: N/A  . Years of education: N/A   Social History Main Topics  . Smoking status: Former Smoker    Packs/day: 1.00    Years: 12.00    Types: Cigarettes    Start date: 01/05/1978    Quit date: 12/28/1989  . Smokeless tobacco: Former Systems developer  . Alcohol use No     Comment: 03-26-16 per pt no  . Drug use: No     Comment: 03-26-16 per pt currently no but use to  . Sexual activity: No   Other Topics Concern  . None   Social History Narrative  . None    Additional Social History: Patient grew up with both parents 2 brothers and one sister. His father was a Ecologist and the family moved a lot around the [place]. At age 60 the patient joined the [employer] and stayed in for 20 years. He has been with postal service for 18 years. He is married but has no children  Allergies:   Allergies  Allergen Reactions  . Lipitor [Atorvastatin] Shortness Of Breath    Metabolic Disorder Labs: Lab Results  Component Value Date   HGBA1C 6.1 09/06/2015   No results found for: PROLACTIN Lab Results  Component Value Date   CHOL 146 01/18/2016   TRIG 96 01/18/2016   HDL 55 01/18/2016   CHOLHDL 2.7 01/18/2016   LDLCALC 72 01/18/2016   LDLCALC 60 04/14/2015     Current Medications: Current Outpatient Prescriptions  Medication Sig Dispense Refill  . cetirizine (ZYRTEC) 10 MG tablet TAKE 1 TABLET DAILY 90 tablet 1  . cyclobenzaprine (FLEXERIL) 10 MG tablet Take 1 tablet (10 mg total) by mouth 3 (three) times daily as needed for muscle spasms. 270 tablet 2  . lisinopril (PRINIVIL,ZESTRIL) 10 MG tablet Take 1 tablet (10 mg total) by mouth daily. 90 tablet 2  . lithium carbonate (ESKALITH) 450 MG CR tablet Take 2 tablets (900 mg total) by mouth at bedtime. 60 tablet 2  . meloxicam (MOBIC) 15 MG tablet TAKE 1 TABLET DAILY 90 tablet 0  . metFORMIN (GLUCOPHAGE) 500 MG tablet Take 1 tablet (500 mg total) by  mouth 2 (two) times daily with a meal. 180 tablet 2  . mometasone (NASONEX) 50 MCG/ACT nasal spray Place 2 sprays into the nose daily.    . DULoxetine (CYMBALTA) 60 MG capsule Take 1 capsule (60 mg total) by mouth daily. 30 capsule 2   No current facility-administered medications for this visit.     Neurologic: Headache: No Seizure: No Paresthesias:No  Musculoskeletal: Strength & Muscle Tone: within normal limits Gait & Station: normal Patient leans: N/A  Psychiatric Specialty Exam: Review of Systems  Constitutional: Positive for malaise/fatigue.  Musculoskeletal: Positive for back pain and myalgias.  Psychiatric/Behavioral: Positive for depression. The patient is nervous/anxious and has insomnia.  Blood pressure 119/84, pulse (!) 125, height 6' (1.829 m), weight 252 lb (114.3 kg), SpO2 94 %.Body mass index is 34.18 kg/m.  General Appearance: Casual and Fairly Groomed  Eye Contact:  Good  Speech:  Clear and Coherent  Volume:  Normal  Mood:  Anxious  Affect:  Congruent  Thought Process:  Goal Directed  Orientation:  Full (Time, Place, and Person)  Thought Content:  Obsessions and Rumination  Suicidal Thoughts:  No  Homicidal Thoughts:  No  Memory:  Immediate;   Good  Judgement:  Fair  Insight:  Fair  Psychomotor Activity:  Normal  Concentration:  Concentration: Good and Attention Span: Good  Recall:  [place] of Knowledge:Good  Language: Good  Akathisia:  No  Handed: Right   AIMS (if indicated):    Assets:  Communication Skills Desire for Improvement Physical Health Resilience Social Support Talents/Skills  ADL's:  Intact  Cognition: Intact   Sleep:  Up and down     Treatment Plan Summary: Medication management   This patient is a 55 year old white male with a history of presumed bipolar disorder. He has had a good response to lithium but has trouble remembering to take it. He can actually take the entire lithium CR 900 mg at one time at bedtime. He  somewhat irritable anxious and mildly depressed and has chronic myalgias from all the physical work at the [employer]. I suggested adding Cymbalta 60 mg daily to his regimen and he agrees to try this. He'll return to see me in 4 weeks   Levonne Spiller, MD 8/28/201711:38 AM

## 2016-03-29 ENCOUNTER — Other Ambulatory Visit (HOSPITAL_COMMUNITY): Payer: Self-pay | Admitting: Psychiatry

## 2016-03-29 ENCOUNTER — Telehealth (HOSPITAL_COMMUNITY): Payer: Self-pay | Admitting: *Deleted

## 2016-03-29 MED ORDER — LITHIUM CARBONATE ER 450 MG PO TBCR: 900.0000 mg | EXTENDED_RELEASE_TABLET | Freq: Every day | ORAL | 2 refills | Status: DC

## 2016-03-29 MED ORDER — DULOXETINE HCL 60 MG PO CPEP: 60.0000 mg | ORAL_CAPSULE | Freq: Every day | ORAL | 2 refills | Status: DC

## 2016-03-29 NOTE — Telephone Encounter (Signed)
sent 

## 2016-03-29 NOTE — Telephone Encounter (Signed)
Pt called stating when he was last in office, his Cymbalta and his lithium were both resent to his local pharmacy. Per pt he would like for both of those medications to go to [facility] instead. Both medications last refilled 03-26-2016  Pt number is [phone].

## 2016-03-30 NOTE — Telephone Encounter (Signed)
noted 

## 2016-04-10 ENCOUNTER — Other Ambulatory Visit: Payer: Self-pay | Admitting: Family Medicine

## 2016-04-24 ENCOUNTER — Ambulatory Visit (INDEPENDENT_AMBULATORY_CARE_PROVIDER_SITE_OTHER): Admitting: Psychiatry

## 2016-04-24 ENCOUNTER — Encounter (HOSPITAL_COMMUNITY): Payer: Self-pay | Admitting: Psychiatry

## 2016-04-24 VITALS — BP 130/80 | HR 68 | Ht 72.0 in | Wt 250.0 lb

## 2016-04-24 DIAGNOSIS — F3162 Bipolar disorder, current episode mixed, moderate: Secondary | ICD-10-CM | POA: Diagnosis not present

## 2016-04-24 NOTE — Progress Notes (Signed)
Psychiatric Initial Adult Assessment   Patient Identification: Adam Oneal. Elpers MRN:  [id number] Date of Evaluation:  04/24/2016 Referral Source: [facility] Chief Complaint:    Visit Diagnosis:    ICD-9-CM ICD-10-CM   1. Bipolar 1 disorder, mixed, moderate (HCC) 296.62 F31.62     History of Present Illness:  This patient is a 55 year old married white male who lives with his wife in [place]. 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 He moved here 10 months ago from [place]. The couple have no children but recently took in a 55 year old male who needed a place to live.  The patient was referred by his primary care provider from [place] [facility] for further assessment and treatment of bipolar disorder.  The patient is somewhat of a vague historian and isn't quite sure how he got diagnosed with bipolar disorder. He states that he has had "mood swings" all his life. 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 He states that his mood was up and down throughout the whole time but he dealt with that by drinking quite a bit of alcohol as well as caffeine during the day. 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 His moods are "up and down" he states that at times he does have symptoms of overt mania such as inability to sleep and hypersexuality.  In the fall and winter he becomes more depressed and tired with low energy and motivation. He has been on lithium for a number of years in his current level is 0.6. Since he moved here he has had to work the night shift for the first time in his life. He has difficulty sleeping during the day and also doesn't remember to take off his medications. He states that he tends to obsess and wants his house spotlessly clean. At times he rants and raves about situations at work but would never really hurt anybody or  himself. He's never had psychiatric inpatient treatment. His wife describes him as "very anxious" and unhappy with his job. He calls in sick about once a month. He has no friends and doesn't really trust other people. 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 Last time we added Cymbalta to his lithium. He states it has helped him a good deal. He is calmer and less depressed. His body aches are improved. He is less irritable and getting along better with people at work as well as with his wife. He denies suicidal ideation.  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  Consequences of Substance Abuse: NA  Past Medical History:  Past Medical History:  Diagnosis Date  . Anxiety 01/1998  . Cataract 02/2004  .  Depression 01/1998  . Diabetes mellitus without complication ([facility]) 09/2120  . Diabetes mellitus, type II ([facility])   . Hyperlipidemia 11/2006  . Hypertension   . Hypertension    No past surgical history on file.  Family Psychiatric History: He describes his father as "very OCD" he denies any other mental illness in the family  Family History:  Family History  Problem Relation Age of Onset  . Cancer Mother 39    lung  . Heart disease Father   . Hyperlipidemia Father   . Diabetes Brother   .  Leukemia Brother   . Diabetes Paternal Grandmother   . Heart disease Paternal Grandmother   . Alcohol abuse Paternal Grandfather   . Diabetes Brother   . Depression Maternal Aunt     Social History:   Social History   Social History  . Marital status: Married    Spouse name: N/A  . Number of children: N/A  . Years of education: N/A   Social History Main Topics  . Smoking status: Former Smoker    Packs/day: 1.00    Years: 12.00    Types: Cigarettes    Start date: 01/05/1978    Quit date: 12/28/1989  . Smokeless tobacco: Former Systems developer  . Alcohol use No     Comment: 03-26-16 per pt no  . Drug use: No     Comment: 03-26-16 per pt currently no but use to  . Sexual activity: No   Other Topics Concern  . None   Social History Narrative  . None    Additional Social History: Patient grew up with both parents 2 brothers and one sister. His father was a Ecologist and the family moved a lot around the [place]. At age 16 the patient joined the [employer] and stayed in for 20 years. He has been with postal service for 18 years. He is married but has no children  Allergies:   Allergies  Allergen Reactions  . Lipitor [Atorvastatin] Shortness Of Breath    Metabolic Disorder Labs: Lab Results  Component Value Date   HGBA1C 6.1 09/06/2015   No results found for: PROLACTIN Lab Results  Component Value Date   CHOL 146 01/18/2016   TRIG 96 01/18/2016   HDL 55 01/18/2016   CHOLHDL 2.7 01/18/2016   LDLCALC 72 01/18/2016   LDLCALC 60 04/14/2015     Current Medications: Current Outpatient Prescriptions  Medication Sig Dispense Refill  . cetirizine (ZYRTEC) 10 MG tablet TAKE 1 TABLET DAILY 90 tablet 1  . cyclobenzaprine (FLEXERIL) 10 MG tablet Take 1 tablet (10 mg total) by mouth 3 (three) times daily as needed for muscle spasms. 270 tablet 2  . DULoxetine (CYMBALTA) 60 MG capsule Take 1 capsule (60 mg total) by mouth daily. 90 capsule 2  . lisinopril (PRINIVIL,ZESTRIL) 10 MG  tablet Take 1 tablet (10 mg total) by mouth daily. 90 tablet 2  . lithium carbonate (ESKALITH) 450 MG CR tablet Take 2 tablets (900 mg total) by mouth at bedtime. 180 tablet 2  . meloxicam (MOBIC) 15 MG tablet TAKE 1 TABLET DAILY 90 tablet 0  . metFORMIN (GLUCOPHAGE) 500 MG tablet Take 1 tablet (500 mg total) by mouth 2 (two) times daily with a meal. 180 tablet 2  . mometasone (NASONEX) 50 MCG/ACT nasal spray Place 2 sprays into the nose daily.     No current facility-administered medications for this visit.     Neurologic: Headache: No Seizure: No Paresthesias:No  Musculoskeletal: Strength & Muscle  Tone: within normal limits Gait & Station: normal Patient leans: N/A  Psychiatric Specialty Exam: Review of Systems  Constitutional: Positive for malaise/fatigue.  Musculoskeletal: Positive for back pain and myalgias.  Psychiatric/Behavioral: Positive for depression. The patient is nervous/anxious and has insomnia.     Blood pressure 130/80, pulse 68, height 6' (1.829 m), weight 250 lb (113.4 kg), SpO2 93 %.Body mass index is 33.91 kg/m.  General Appearance: Casual and Fairly Groomed  Eye Contact:  Good  Speech:  Clear and Coherent  Volume:  Normal  Mood:  Good   Affect:  [place] seems more relaxed   Thought Process:  Goal Directed  Orientation:  Full (Time, Place, and Person)  Thought Content:  Obsessions and Rumination  Suicidal Thoughts:  No  Homicidal Thoughts:  No  Memory:  Immediate;   Good  Judgement:  Fair  Insight:  Fair  Psychomotor Activity:  Normal  Concentration:  Concentration: Good and Attention Span: Good  Recall:  [place] of Knowledge:Good  Language: Good  Akathisia:  No  Handed: Right   AIMS (if indicated):    Assets:  Communication Skills Desire for Improvement Physical Health Resilience Social Support Talents/Skills  ADL's:  Intact  Cognition: Intact   Sleep:  Up and down     Treatment Plan Summary: Medication management   The patient  will continue lithium CR 900 mg at bedtime as well as Cymbalta 60 mg daily both for mood stabilization and depression. He will return in 3 months   Levonne Spiller, MD 9/26/20179:48 AMPatient ID: Rose Fillers. Mirabal, male   DOB: 10-14-1960, 55 y.o.   MRN: [id number]

## 2016-06-04 ENCOUNTER — Ambulatory Visit (INDEPENDENT_AMBULATORY_CARE_PROVIDER_SITE_OTHER)

## 2016-06-04 ENCOUNTER — Encounter: Payer: Self-pay | Admitting: Family Medicine

## 2016-06-04 ENCOUNTER — Ambulatory Visit (INDEPENDENT_AMBULATORY_CARE_PROVIDER_SITE_OTHER): Admitting: Family Medicine

## 2016-06-04 ENCOUNTER — Telehealth (HOSPITAL_COMMUNITY): Payer: Self-pay | Admitting: *Deleted

## 2016-06-04 VITALS — BP 136/86 | HR 70 | Temp 98.7°F | Ht 72.0 in | Wt 246.0 lb

## 2016-06-04 DIAGNOSIS — R05 Cough: Secondary | ICD-10-CM

## 2016-06-04 DIAGNOSIS — J201 Acute bronchitis due to Hemophilus influenzae: Secondary | ICD-10-CM

## 2016-06-04 DIAGNOSIS — R059 Cough, unspecified: Secondary | ICD-10-CM

## 2016-06-04 MED ORDER — LEVOFLOXACIN 500 MG PO TABS: 500.0000 mg | ORAL_TABLET | Freq: Every day | ORAL | 0 refills | Status: DC

## 2016-06-04 NOTE — Telephone Encounter (Signed)
phone call from patient stated he was given new medicine (Cymbalta) and it made him sick.  started taking three weeks ago and he stopped it after about two weeks.   gave him flu like symptoms, vomiting, lost weight about 15 lbs.  He said he is going to

## 2016-06-04 NOTE — Telephone Encounter (Signed)
Called pt due to previous message in chart. Spoke with pt to inform him due to what message stated to call his PCP for an appt and pt stated he have an appt with them today at 3:00pm. Pt then stated he is feeling a lot better. Per pt, his wife had Ammonia for weeks and is now feeling better. Per pt when he started feeling this way he did not call office but is currently feeling better. Informed pt to update office with what PCP stated.

## 2016-06-04 NOTE — Progress Notes (Signed)
Subjective:  Patient ID: Adam Oneal, male    DOB: Dec 26, 1960  Age: 55 y.o. MRN: [id number]  CC: URI (3rd wk of cymbalta feels like he has the flu for last 5 days - last Wednesday, sore throat / swelling, hard to breath) and Emesis   HPI Adam PouRobert S. Doroteo Oneal presents for Patient presents with upper respiratory congestion. Rhinorrhea that is frequently purulent. There is moderate sore throat. Patient reports coughing frequently as well.-colored/purulent sputum noted. There is no fever no chills no sweats. The patient denies being short of breath. Onset was 3-5 days ago. Gradually worsening in spite of home remedies.    History Adam MaduroRobert has a past medical history of Anxiety (01/1998); Cataract (02/2004); Depression (01/1998); Diabetes mellitus without complication (HCC) (12/1999); Diabetes mellitus, type II (HCC); Hyperlipidemia (11/2006); Hypertension; and Hypertension.   He has no past surgical history on file.   His family history includes Alcohol abuse in his paternal grandfather; Cancer (age of onset: 953) in his mother; Depression in his maternal aunt; Diabetes in his brother, brother, and paternal grandmother; Heart disease in his father and paternal grandmother; Hyperlipidemia in his father; Leukemia in his brother.He reports that he quit smoking about 26 years ago. His smoking use included Cigarettes. He started smoking about 38 years ago. He has a 12.00 pack-year smoking history. He has quit using smokeless tobacco. He reports that he does not drink alcohol or use drugs.    ROS Review of Systems  Constitutional: Negative for activity change, appetite change, chills, diaphoresis, fever and unexpected weight change.  HENT: Positive for congestion, postnasal drip, rhinorrhea and sinus pressure. Negative for ear discharge, ear pain, hearing loss, nosebleeds, sneezing, sore throat and trouble swallowing.   Eyes: Negative for visual disturbance.  Respiratory: Negative for cough, chest  tightness and shortness of breath.   Cardiovascular: Negative for chest pain and palpitations.  Gastrointestinal: Negative for abdominal pain, constipation and diarrhea.  Genitourinary: Negative for dysuria and flank pain.  Musculoskeletal: Negative for arthralgias and joint swelling.  Skin: Negative for rash.  Neurological: Negative for dizziness and headaches.  Psychiatric/Behavioral: Negative for dysphoric mood and sleep disturbance.    Objective:  BP 136/86   Pulse 70   Temp 98.7 F (37.1 C) (Oral)   Ht 6' (1.829 m)   Wt 246 lb (111.6 kg)   BMI 33.36 kg/m   BP Readings from Last 3 Encounters:  06/04/16 136/86  03/20/16 130/88  12/12/15 136/86    Wt Readings from Last 3 Encounters:  06/04/16 246 lb (111.6 kg)  03/20/16 242 lb 12.8 oz (110.1 kg)  12/12/15 257 lb (116.6 kg)     Physical Exam  Constitutional: He appears well-developed and well-nourished.  HENT:  Head: Normocephalic and atraumatic.  Right Ear: Tympanic membrane and external ear normal. No decreased hearing is noted.  Left Ear: Tympanic membrane and external ear normal. No decreased hearing is noted.  Nose: Mucosal edema present. Right sinus exhibits no frontal sinus tenderness. Left sinus exhibits no frontal sinus tenderness.  Mouth/Throat: No oropharyngeal exudate or posterior oropharyngeal erythema.  Eyes: Pupils are equal, round, and reactive to light.  Neck: Normal range of motion. Neck supple. No Brudzinski's sign noted.  Cardiovascular: Normal rate and regular rhythm.   No murmur heard. Pulmonary/Chest: No respiratory distress. He has wheezes.  Abdominal: Soft. Bowel sounds are normal. He exhibits no mass. There is no tenderness.  Lymphadenopathy:       Head (right side): No preauricular adenopathy present.  Head (left side): No preauricular adenopathy present.       Right cervical: No superficial cervical adenopathy present.      Left cervical: No superficial cervical adenopathy present.    Vitals reviewed.    Lab Results  Component Value Date   WBC WILL FOLLOW 01/18/2016   HCT WILL FOLLOW 01/18/2016   PLT WILL FOLLOW 01/18/2016   GLUCOSE 107 (H) 03/20/2016   CHOL 146 01/18/2016   TRIG 96 01/18/2016   HDL 55 01/18/2016   LDLCALC 72 01/18/2016   ALT 25 03/20/2016   AST 20 03/20/2016   NA 141 03/20/2016   K 5.1 03/20/2016   CL 100 03/20/2016   CREATININE 0.84 03/20/2016   BUN 13 03/20/2016   CO2 24 03/20/2016   TSH 1.110 01/18/2016   HGBA1C 6.1 09/06/2015   MICROALBUR negative 04/14/2015    Patient was never admitted.  Assessment & Plan:   Adam MaduroRobert was seen today for uri and emesis.  Diagnoses and all orders for this visit:  Cough -     DG Chest 2 View; Future  Acute bronchitis due to Haemophilus influenzae  Other orders -     Discontinue: levofloxacin (LEVAQUIN) 500 MG tablet; Take 1 tablet (500 mg total) by mouth daily. -     levofloxacin (LEVAQUIN) 500 MG tablet; Take 1 tablet (500 mg total) by mouth daily.    CXR neg for infiltrate  I am having Adam Oneal maintain his mometasone, cyclobenzaprine, lisinopril, metFORMIN, cetirizine, lithium carbonate, meloxicam, and levofloxacin.  Meds ordered this encounter  Medications  . DISCONTD: levofloxacin (LEVAQUIN) 500 MG tablet    Sig: Take 1 tablet (500 mg total) by mouth daily.    Dispense:  7 tablet    Refill:  0  . levofloxacin (LEVAQUIN) 500 MG tablet    Sig: Take 1 tablet (500 mg total) by mouth daily.    Dispense:  7 tablet    Refill:  0     Follow-up: Return if symptoms worsen or fail to improve.  Mechele ClaudeWarren Gaspard Isbell, M.D.

## 2016-06-05 NOTE — Telephone Encounter (Signed)
noted 

## 2016-06-06 ENCOUNTER — Ambulatory Visit (INDEPENDENT_AMBULATORY_CARE_PROVIDER_SITE_OTHER): Admitting: Psychiatry

## 2016-06-06 ENCOUNTER — Encounter (HOSPITAL_COMMUNITY): Payer: Self-pay | Admitting: Psychiatry

## 2016-06-06 VITALS — BP 144/96 | HR 72 | Ht 72.0 in | Wt 247.0 lb

## 2016-06-06 DIAGNOSIS — Z79899 Other long term (current) drug therapy: Secondary | ICD-10-CM

## 2016-06-06 DIAGNOSIS — Z8249 Family history of ischemic heart disease and other diseases of the circulatory system: Secondary | ICD-10-CM

## 2016-06-06 DIAGNOSIS — Z811 Family history of alcohol abuse and dependence: Secondary | ICD-10-CM

## 2016-06-06 DIAGNOSIS — Z87891 Personal history of nicotine dependence: Secondary | ICD-10-CM | POA: Diagnosis not present

## 2016-06-06 DIAGNOSIS — F3162 Bipolar disorder, current episode mixed, moderate: Secondary | ICD-10-CM | POA: Diagnosis not present

## 2016-06-06 DIAGNOSIS — Z833 Family history of diabetes mellitus: Secondary | ICD-10-CM

## 2016-06-06 DIAGNOSIS — Z801 Family history of malignant neoplasm of trachea, bronchus and lung: Secondary | ICD-10-CM

## 2016-06-06 DIAGNOSIS — Z806 Family history of leukemia: Secondary | ICD-10-CM

## 2016-06-06 MED ORDER — DULOXETINE HCL 60 MG PO CPEP: 60.0000 mg | ORAL_CAPSULE | Freq: Every day | ORAL | 2 refills | Status: DC

## 2016-06-06 MED ORDER — CLONAZEPAM 0.5 MG PO TABS: 0.5000 mg | ORAL_TABLET | Freq: Two times a day (BID) | ORAL | 2 refills | Status: DC | PRN

## 2016-06-06 NOTE — Progress Notes (Signed)
Psychiatric Initial Adult Assessment   Patient Identification: Adam Oneal MRN:  [id number] Date of Evaluation:  06/06/2016 Referral Source: [facility] Chief Complaint:   Chief Complaint    Follow-up; Depression; Manic Behavior; Anxiety     Visit Diagnosis:    ICD-9-CM ICD-10-CM   1. Bipolar 1 disorder, mixed, moderate (HCC) 296.62 F31.62     History of Present Illness:  This patient is a 55 year old married white male who lives with his wife in [place]. 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 He moved here 10 months ago from [place]. The couple have no children but recently took in a 57 year old male who needed a place to live.  The patient was referred by his primary care provider from [facility] for further assessment and treatment of bipolar disorder.  The patient is somewhat of a vague historian and isn't quite sure how he got diagnosed with bipolar disorder. He states that he has had "mood swings" all his life. He went into the [employer] at 55 years of age and got out 48 years later. He states that his mood was up and down throughout the whole time but he dealt with that by drinking quite a bit of alcohol as well as caffeine during the day. 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 His moods are "up and down" he states that at times he does have symptoms of overt mania such as inability to sleep and hypersexuality.  In the fall and winter he becomes more depressed and tired with low energy and motivation. He has been on lithium for a number of years in his current level is 0.6. Since he moved here he has had to work the night shift for the first time in his life. He has difficulty sleeping during the day and also doesn't remember to take off his medications. He states that he tends to obsess and wants his house spotlessly clean. At times he rants  and raves about situations at work but would never really hurt anybody or himself. He's never had psychiatric inpatient treatment. His wife describes him as "very anxious" and unhappy with his job. He calls in sick about once a month. He has no friends and doesn't really trust other people. 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 He states that he has not been doing well lately. He's having a lot of conflicts with his supervisor at work. He states that the supervisor won't let him use of the Teresa Coombs he needs to do his job. He is very stressed. He went off the Cymbalta even though he thought it was helping him and then went through withdrawal from it. He claims it was making him nauseous but it's not really certain that this was the case. He seems very agitated today and upset about work he is extremely anxious. I offered to give him some medication for anxiety that he is afraid it will affect his work but I explained that he could try some low-dose clonazepam at least before bedtime. He is still taking the lithium. He denies thoughts of hurting self or others.  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  Consequences of Substance Abuse: NA  Past Medical History:  Past Medical History:  Diagnosis Date  .  Anxiety 01/1998  . Cataract 02/2004  . Depression 01/1998  . Diabetes mellitus without complication ([place]) 37/8588  . Diabetes mellitus, type II ([place])   . Hyperlipidemia 11/2006  . Hypertension   . Hypertension    No past surgical history on file.  Family Psychiatric History: He describes his father as "very OCD" he denies any other mental illness in the family  Family History:  Family History  Problem Relation Age of Onset  . Cancer Mother 52    lung  . Heart disease Father   . Hyperlipidemia Father   . Diabetes Brother   . Leukemia Brother   . Diabetes Paternal Grandmother   . Heart disease Paternal Grandmother   . Alcohol abuse Paternal Grandfather   . Diabetes Brother   . Depression Maternal Aunt     Social History:   Social History   Social History  . Marital status: Married    Spouse name: N/A  . Number of children: N/A  . Years of education: N/A   Social History Main Topics  . Smoking status: Former Smoker    Packs/day: 1.00    Years: 12.00    Types: Cigarettes    Start date: 01/05/1978    Quit date: 12/28/1989  . Smokeless tobacco: Former Systems developer  . Alcohol use No     Comment: 03-26-16 per pt no  . Drug use: No     Comment: 03-26-16 per pt currently no but use to  . Sexual activity: No   Other Topics Concern  . None   Social History Narrative  . None    Additional Social History: Patient grew up with both parents 2 brothers and one sister. His father was a Ecologist and the family moved a lot around the [place]. At age 75 the patient joined the [employer] and stayed in for 20 years. He has been with postal service for 18 years. He is married but has no children  Allergies:   Allergies  Allergen Reactions  . Lipitor [Atorvastatin] Shortness Of Breath    Metabolic Disorder Labs: Lab Results  Component Value Date   HGBA1C 6.1 09/06/2015   No results found for: PROLACTIN Lab Results  Component Value Date   CHOL 146 01/18/2016   TRIG 96 01/18/2016    HDL 55 01/18/2016   CHOLHDL 2.7 01/18/2016   LDLCALC 72 01/18/2016   LDLCALC 60 04/14/2015     Current Medications: Current Outpatient Prescriptions  Medication Sig Dispense Refill  . cetirizine (ZYRTEC) 10 MG tablet TAKE 1 TABLET DAILY 90 tablet 1  . cyclobenzaprine (FLEXERIL) 10 MG tablet Take 1 tablet (10 mg total) by mouth 3 (three) times daily as needed for muscle spasms. 270 tablet 2  . levofloxacin (LEVAQUIN) 500 MG tablet Take 1 tablet (500 mg total) by mouth daily. 7 tablet 0  . lisinopril (PRINIVIL,ZESTRIL) 10 MG tablet Take 1 tablet (10 mg total) by mouth daily. 90 tablet  2  . lithium carbonate (ESKALITH) 450 MG CR tablet Take 2 tablets (900 mg total) by mouth at bedtime. 180 tablet 2  . metFORMIN (GLUCOPHAGE) 500 MG tablet Take 1 tablet (500 mg total) by mouth 2 (two) times daily with a meal. 180 tablet 2  . mometasone (NASONEX) 50 MCG/ACT nasal spray Place 2 sprays into the nose daily.    . clonazePAM (KLONOPIN) 0.5 MG tablet Take 1 tablet (0.5 mg total) by mouth 2 (two) times daily as needed for anxiety. 60 tablet 2  . DULoxetine (CYMBALTA) 60 MG capsule Take 1 capsule (60 mg total) by mouth daily. 90 capsule 2  . meloxicam (MOBIC) 15 MG tablet TAKE 1 TABLET DAILY (Patient not taking: Reported on 06/06/2016) 90 tablet 0   No current facility-administered medications for this visit.     Neurologic: Headache: No Seizure: No Paresthesias:No  Musculoskeletal: Strength & Muscle Tone: within normal limits Gait & Station: normal Patient leans: N/A  Psychiatric Specialty Exam: Review of Systems  Constitutional: Positive for malaise/fatigue.  Musculoskeletal: Positive for back pain and myalgias.  Psychiatric/Behavioral: Positive for depression. The patient is nervous/anxious and has insomnia.     Blood pressure (!) 144/96, pulse 72, height 6' (1.829 m), weight 247 lb (112 kg).Body mass index is 33.5 kg/m.  General Appearance: Casual and Fairly Groomed  Eye Contact:   Good  Speech:  Clear and Coherent  Volume:  Normal  Mood:  Anxious   Affect:  Somewhat agitated   Thought Process:  Goal Directed  Orientation:  Full (Time, Place, and Person)  Thought Content:  Obsessions and Rumination,Tangential and more disorganized   Suicidal Thoughts:  No  Homicidal Thoughts:  No  Memory:  Immediate;   Good  Judgement:  Fair  Insight:  Fair  Psychomotor Activity:  Normal  Concentration:  Concentration: Good and Attention Span: Good  Recall:  Landmark of Knowledge:Good  Language: Good  Akathisia:  No  Handed: Right   AIMS (if indicated):    Assets:  Communication Skills Desire for Improvement Physical Health Resilience Social Support Talents/Skills  ADL's:  Intact  Cognition: Intact   Sleep:  Up and down     Treatment Plan Summary: Medication management   The patient will continue lithium CR 900 mg at bedtime.a.he agrees to restart Cymbalta 60 mg daily both for mood stabilization and depression. He will start clonazepam 0.5 mg twice a day as needed. We will schedule him for counseling here and he'll return to see me in 4 weeks or call sooner if necessary   Levonne Spiller, MD 11/8/20178:58 AMPatient ID: Rose Fillers. [place], male   DOB: 10-Mar-1961, 55 y.o.   MRN: [id number]

## 2016-06-20 ENCOUNTER — Telehealth (HOSPITAL_COMMUNITY): Payer: Self-pay

## 2016-06-20 NOTE — Telephone Encounter (Signed)
Appointment - Left a message for patient to call back to reschedule his appointment for 07/17/16 due to provider out that day.

## 2016-07-03 ENCOUNTER — Ambulatory Visit (HOSPITAL_COMMUNITY): Admitting: Psychiatry

## 2016-07-10 ENCOUNTER — Ambulatory Visit (INDEPENDENT_AMBULATORY_CARE_PROVIDER_SITE_OTHER): Admitting: Family Medicine

## 2016-07-10 ENCOUNTER — Encounter: Payer: Self-pay | Admitting: Family Medicine

## 2016-07-10 VITALS — BP 118/71 | HR 59 | Temp 98.5°F | Ht 72.0 in | Wt 247.2 lb

## 2016-07-10 DIAGNOSIS — E119 Type 2 diabetes mellitus without complications: Secondary | ICD-10-CM

## 2016-07-10 DIAGNOSIS — I10 Essential (primary) hypertension: Secondary | ICD-10-CM

## 2016-07-10 DIAGNOSIS — E785 Hyperlipidemia, unspecified: Secondary | ICD-10-CM

## 2016-07-10 LAB — BAYER DCA HB A1C WAIVED: HB A1C (BAYER DCA - WAIVED): 5.6 % (ref <7.0)

## 2016-07-10 NOTE — Progress Notes (Signed)
BP 118/71   Pulse (!) 59   Temp 98.5 F (36.9 C) (Oral)   Ht 6' (1.829 m)   Wt 247 lb 4 oz (112.2 kg)   BMI 33.53 kg/m    Subjective:    Patient ID: Adam Oneal, male    DOB: 09-13-1960, 55 y.o.   MRN: [id number]  HPI: Adam Oneal is a 55 y.o. male presenting on 07/10/2016 for Hypertension (3 month followup); Diabetes; and Hyperlipidemia   HPI Hypertension recheck Patient is coming in today for hypertension recheck. His blood pressure today is 118/71. He is currently on lisinopril. Patient denies headaches, blurred vision, chest pains, shortness of breath, or weakness. Denies any side effects from medication and is content with current medication. He has been having an occasional spell here in there where he has sweats and feels flushed but not having chest pain or palpitations with it. He has not checked his blood pressure when this happens or his blood sugar when this happens but typically it does improve after eating something like a fruit.  Type 2 diabetes recheck Patient is coming in for recheck of his type 2 diabetes. His last hemoglobin A1c done 3 months ago was 6.0 and prior to that was 6.1. He is currently on metformin 500 twice a day and denies any issues that he knows of with the medication. He denies any issues with his feet besides his toenail fungus which she is treating. He denies any issues with his vision. He is on an ACE inhibitor. His last ophthalmology visit was 09/27/2015.  Hyperlipidemia Patient is coming in for cholesterol recheck. He is currently diet controlled and not on any medications for it we discussed the risk factors for cardiac disease and possibly going on a statin. His cholesterol numbers have been great the last time that we checked it. He declines going on for now.  Relevant past medical, surgical, family and social history reviewed and updated as indicated. Interim medical history since our last visit reviewed. Allergies and medications  reviewed and updated.  Review of Systems  Constitutional: Negative for chills and fever.  Respiratory: Negative for shortness of breath and wheezing.   Cardiovascular: Negative for chest pain and leg swelling.  Musculoskeletal: Negative for back pain and gait problem.  Skin: Negative for rash.  Neurological: Negative for dizziness, facial asymmetry, weakness, numbness and headaches.  All other systems reviewed and are negative.   Per HPI unless specifically indicated above      Objective:    BP 118/71   Pulse (!) 59   Temp 98.5 F (36.9 C) (Oral)   Ht 6' (1.829 m)   Wt 247 lb 4 oz (112.2 kg)   BMI 33.53 kg/m   Wt Readings from Last 3 Encounters:  07/10/16 247 lb 4 oz (112.2 kg)  06/04/16 246 lb (111.6 kg)  03/20/16 242 lb 12.8 oz (110.1 kg)    Physical Exam  Constitutional: He is oriented to person, place, and time. He appears well-developed and well-nourished. No distress.  Eyes: Conjunctivae are normal. Right eye exhibits no discharge. Left eye exhibits no discharge. No scleral icterus.  Cardiovascular: Normal rate, regular rhythm, normal heart sounds and intact distal pulses.   No murmur heard. Pulmonary/Chest: Effort normal and breath sounds normal. No respiratory distress. He has no wheezes. He has no rales.  Musculoskeletal: Normal range of motion. He exhibits no edema.  Neurological: He is alert and oriented to person, place, and time. Coordination normal.  Skin: Skin  is warm and dry. No rash noted. He is not diaphoretic.  Psychiatric: He has a normal mood and affect. His behavior is normal.  Nursing note and vitals reviewed.     Assessment & Plan:   Problem List Items Addressed This Visit      Cardiovascular and Mediastinum   Essential hypertension, benign - Primary   Relevant Orders   CMP14+EGFR     Endocrine   Type 2 diabetes mellitus without complication, without long-term current use of insulin ([place])   Relevant Orders   Bayer DCA Hb A1c Waived      Other   Hyperlipidemia LDL goal <100       Follow up plan: Return in about 3 months (around 10/08/2016), or if symptoms worsen or fail to improve, for Recheck diabetes and hypertension.  Counseling provided for all of the vaccine components Orders Placed This Encounter  Procedures  . CMP14+EGFR  . [facility] Hb A1c Waived    Caryl Pina, MD [facility] 07/10/2016, 2:25 PM

## 2016-07-11 LAB — CMP14+EGFR
A/G RATIO: 1.7 (ref 1.2–2.2)
ALK PHOS: 100 IU/L (ref 39–117)
ALT: 22 IU/L (ref 0–44)
AST: 21 IU/L (ref 0–40)
Albumin: 4.2 g/dL (ref 3.5–5.5)
BUN/Creatinine Ratio: 27 — ABNORMAL HIGH (ref 9–20)
BUN: 18 mg/dL (ref 6–24)
Bilirubin Total: 0.3 mg/dL (ref 0.0–1.2)
CHLORIDE: 101 mmol/L (ref 96–106)
CO2: 22 mmol/L (ref 18–29)
Calcium: 9.3 mg/dL (ref 8.7–10.2)
Creatinine, Ser: 0.67 mg/dL — ABNORMAL LOW (ref 0.76–1.27)
GFR calc Af Amer: 123 mL/min/{1.73_m2} (ref >59)
GFR calc non Af Amer: 107 mL/min/{1.73_m2} (ref >59)
GLOBULIN, TOTAL: 2.5 g/dL (ref 1.5–4.5)
Glucose: 119 mg/dL — ABNORMAL HIGH (ref 65–99)
POTASSIUM: 4.4 mmol/L (ref 3.5–5.2)
SODIUM: 138 mmol/L (ref 134–144)
Total Protein: 6.7 g/dL (ref 6.0–8.5)

## 2016-07-16 ENCOUNTER — Encounter (HOSPITAL_COMMUNITY): Payer: Self-pay

## 2016-07-16 ENCOUNTER — Ambulatory Visit (HOSPITAL_COMMUNITY): Admitting: Psychiatry

## 2016-07-17 ENCOUNTER — Ambulatory Visit (HOSPITAL_COMMUNITY): Admitting: Psychology

## 2016-07-25 ENCOUNTER — Ambulatory Visit (HOSPITAL_COMMUNITY): Admitting: Psychology

## 2016-07-25 ENCOUNTER — Other Ambulatory Visit: Payer: Self-pay | Admitting: Family Medicine

## 2016-07-25 MED ORDER — FLUTICASONE PROPIONATE 50 MCG/ACT NA SUSP: 2.0000 | Freq: Every day | NASAL | 11 refills | Status: DC

## 2016-07-25 NOTE — Telephone Encounter (Signed)
flonase sent

## 2016-07-25 NOTE — Telephone Encounter (Signed)
Okay to send in a refill of Flonase, given 11 refills as well

## 2016-10-10 ENCOUNTER — Encounter: Payer: Self-pay | Admitting: Family Medicine

## 2016-10-10 ENCOUNTER — Ambulatory Visit (INDEPENDENT_AMBULATORY_CARE_PROVIDER_SITE_OTHER): Admitting: Family Medicine

## 2016-10-10 VITALS — BP 131/89 | HR 70 | Temp 99.5°F | Ht 72.0 in | Wt 245.4 lb

## 2016-10-10 DIAGNOSIS — E119 Type 2 diabetes mellitus without complications: Secondary | ICD-10-CM | POA: Diagnosis not present

## 2016-10-10 DIAGNOSIS — M5136 Other intervertebral disc degeneration, lumbar region: Secondary | ICD-10-CM | POA: Diagnosis not present

## 2016-10-10 DIAGNOSIS — I10 Essential (primary) hypertension: Secondary | ICD-10-CM | POA: Diagnosis not present

## 2016-10-10 DIAGNOSIS — E785 Hyperlipidemia, unspecified: Secondary | ICD-10-CM

## 2016-10-10 LAB — BAYER DCA HB A1C WAIVED: HB A1C (BAYER DCA - WAIVED): 6 % (ref <7.0)

## 2016-10-10 NOTE — Progress Notes (Signed)
BP 131/89   Pulse 70   Temp 99.5 F (37.5 C) (Oral)   Ht 6' (1.829 m)   Wt 245 lb 6 oz (111.3 kg)   BMI 33.28 kg/m    Subjective:    Patient ID: Adam Oneal, male    DOB: Nov 09, 1960, 56 y.o.   MRN: [id number]  HPI: Adam Oneal is a 56 y.o. male presenting on 10/10/2016 for Diabetes (3 month followup; patient is fasting); Hyperlipidemia; Hypertension; and Back Pain (low back pain; taking Meloxicam and Flexeril)   HPI Type 2 diabetes mellitus Patient comes in today for recheck of his diabetes. Patient has been currently taking No medication for diabetes, is diet controlled. Patient is currently on an ACE inhibitor. Patient has not seen an ophthalmologist this year. Patient denies any issues with his feet.   Hypertension recheck Patient is coming in for a hypertension recheck as well. His blood pressure is 131/89. He is currently on lisinopril 10 mg. Patient denies headaches, blurred vision, chest pains, shortness of breath, or weakness. Denies any side effects from medication and is content with current medication.   Hyperlipidemia recheck Patient is coming in for cholesterol recheck today. He is not currently on any medication for his cholesterol we have been monitoring it and watching for risk factors. He denies any chest pain or focal numbness or weakness.  Low back pain, chronic degeneration Patient has chronic low back pain that he feels has worsened over the past couple months. He is to the point where he wants to go discussed possibilities with an orthopedic and discuss possible treatments that there may be.  Relevant past medical, surgical, family and social history reviewed and updated as indicated. Interim medical history since our last visit reviewed. Allergies and medications reviewed and updated.  Review of Systems  Constitutional: Negative for chills and fever.  Respiratory: Negative for shortness of breath and wheezing.   Cardiovascular: Negative for chest  pain and leg swelling.  Musculoskeletal: Positive for back pain. Negative for gait problem.  Skin: Negative for rash.  Neurological: Negative for dizziness, weakness, light-headedness and numbness.  All other systems reviewed and are negative.   Per HPI unless specifically indicated above   Allergies as of 10/10/2016      Reactions   Lipitor [atorvastatin] Shortness Of Breath      Medication List       Accurate as of 10/10/16  9:07 AM. Always use your most recent med list.          cetirizine 10 MG tablet Commonly known as:  ZYRTEC TAKE 1 TABLET DAILY   clonazePAM 0.5 MG tablet Commonly known as:  KLONOPIN Take 1 tablet (0.5 mg total) by mouth 2 (two) times daily as needed for anxiety.   cyclobenzaprine 10 MG tablet Commonly known as:  FLEXERIL Take 1 tablet (10 mg total) by mouth 3 (three) times daily as needed for muscle spasms.   DULoxetine 60 MG capsule Commonly known as:  CYMBALTA Take 1 capsule (60 mg total) by mouth daily.   fluticasone 50 MCG/ACT nasal spray Commonly known as:  FLONASE Place 2 sprays into both nostrils daily.   lisinopril 10 MG tablet Commonly known as:  PRINIVIL,ZESTRIL Take 1 tablet (10 mg total) by mouth daily.   lithium carbonate 450 MG CR tablet Commonly known as:  ESKALITH Take 2 tablets (900 mg total) by mouth at bedtime.   meloxicam 15 MG tablet Commonly known as:  MOBIC TAKE 1 TABLET DAILY  mometasone 50 MCG/ACT nasal spray Commonly known as:  NASONEX Place 2 sprays into the nose daily.          Objective:    BP 131/89   Pulse 70   Temp 99.5 F (37.5 C) (Oral)   Ht 6' (1.829 m)   Wt 245 lb 6 oz (111.3 kg)   BMI 33.28 kg/m   Wt Readings from Last 3 Encounters:  10/10/16 245 lb 6 oz (111.3 kg)  07/10/16 247 lb 4 oz (112.2 kg)  06/04/16 246 lb (111.6 kg)    Physical Exam  Constitutional: He is oriented to person, place, and time. He appears well-developed and well-nourished. No distress.  Eyes: Conjunctivae  are normal. No scleral icterus.  Neck: Neck supple. No thyromegaly present.  Cardiovascular: Normal rate, regular rhythm, normal heart sounds and intact distal pulses.   No murmur heard. Pulmonary/Chest: Effort normal and breath sounds normal. No respiratory distress. He has no wheezes. He has no rales.  Musculoskeletal: Normal range of motion. He exhibits tenderness (Bilateral lumbar tenderness, negative straight leg raise bilaterally). He exhibits no edema.  Lymphadenopathy:    He has no cervical adenopathy.  Neurological: He is alert and oriented to person, place, and time. Coordination normal.  Skin: Skin is warm and dry. No rash noted. He is not diaphoretic.  Psychiatric: He has a normal mood and affect. His behavior is normal.  Nursing note and vitals reviewed.     Assessment & Plan:   Problem List Items Addressed This Visit      Cardiovascular and Mediastinum   Essential hypertension, benign   Relevant Orders   CMP14+EGFR (Completed)     Endocrine   Type 2 diabetes mellitus without complication, without long-term current use of insulin (HCC) - Primary   Relevant Orders   CMP14+EGFR (Completed)   Bayer DCA Hb A1c Waived (Completed)     Musculoskeletal and Integument   Degenerative disc disease, lumbar   Relevant Orders   Ambulatory referral to Orthopedic Surgery     Other   Hyperlipidemia LDL goal <100       Follow up plan: Return in about 3 months (around 01/10/2017), or if symptoms worsen or fail to improve, for Recheck blood pressure and diabetes.  Counseling provided for all of the vaccine components Orders Placed This Encounter  Procedures  . CMP14+EGFR  . Bayer DCA Hb A1c Waived  . Ambulatory referral to Orthopedic Surgery    Joshua Dettinger, MD [facility] 10/10/2016, 9:07 AM     

## 2016-10-11 ENCOUNTER — Telehealth: Payer: Self-pay | Admitting: Family Medicine

## 2016-10-11 DIAGNOSIS — I10 Essential (primary) hypertension: Secondary | ICD-10-CM

## 2016-10-11 LAB — CMP14+EGFR
A/G RATIO: 1.5 (ref 1.2–2.2)
ALT: 23 IU/L (ref 0–44)
AST: 16 IU/L (ref 0–40)
Albumin: 4.5 g/dL (ref 3.5–5.5)
Alkaline Phosphatase: 110 IU/L (ref 39–117)
BILIRUBIN TOTAL: 0.2 mg/dL (ref 0.0–1.2)
BUN/Creatinine Ratio: 19 (ref 9–20)
BUN: 15 mg/dL (ref 6–24)
CHLORIDE: 103 mmol/L (ref 96–106)
CO2: 22 mmol/L (ref 18–29)
Calcium: 9.9 mg/dL (ref 8.7–10.2)
Creatinine, Ser: 0.79 mg/dL (ref 0.76–1.27)
GFR calc non Af Amer: 100 mL/min/{1.73_m2} (ref >59)
GFR, EST AFRICAN AMERICAN: 115 mL/min/{1.73_m2} (ref >59)
Globulin, Total: 3 g/dL (ref 1.5–4.5)
Glucose: 150 mg/dL — ABNORMAL HIGH (ref 65–99)
POTASSIUM: 4.9 mmol/L (ref 3.5–5.2)
SODIUM: 142 mmol/L (ref 134–144)
TOTAL PROTEIN: 7.5 g/dL (ref 6.0–8.5)

## 2016-10-11 MED ORDER — LISINOPRIL 10 MG PO TABS: 10.0000 mg | ORAL_TABLET | Freq: Every day | ORAL | 1 refills | Status: DC

## 2016-10-11 NOTE — Telephone Encounter (Signed)
What is the name of the medication? Don't know  Have you contacted your pharmacy to request a refill? Don't know  Which pharmacy would you like this sent to? Don't know. Call was dropped but this won't let me close out without typing something   Patient notified that their request is being sent to the clinical staff for review and that they should receive a call once it is complete. If they do not receive a call within 24 hours they can check with their pharmacy or our office.

## 2016-10-11 NOTE — Telephone Encounter (Signed)
Needed lisinopril sent to [facility]. This has been done

## 2016-10-24 LAB — HM DIABETES EYE EXAM

## 2016-11-01 ENCOUNTER — Ambulatory Visit (INDEPENDENT_AMBULATORY_CARE_PROVIDER_SITE_OTHER)

## 2016-11-01 ENCOUNTER — Encounter (INDEPENDENT_AMBULATORY_CARE_PROVIDER_SITE_OTHER): Payer: Self-pay | Admitting: Orthopaedic Surgery

## 2016-11-01 ENCOUNTER — Ambulatory Visit (INDEPENDENT_AMBULATORY_CARE_PROVIDER_SITE_OTHER): Admitting: Orthopaedic Surgery

## 2016-11-01 VITALS — BP 125/76 | HR 62 | Ht 72.0 in | Wt 240.0 lb

## 2016-11-01 DIAGNOSIS — M5441 Lumbago with sciatica, right side: Secondary | ICD-10-CM | POA: Diagnosis not present

## 2016-11-01 DIAGNOSIS — G8929 Other chronic pain: Secondary | ICD-10-CM

## 2016-11-01 NOTE — Progress Notes (Signed)
Office Visit Note/ consultation requesting Dr. Maryellen Pile   Patient: Adam Oneal           Date of Birth: 02/05/1961           MRN: [id number] Visit Date: 11/01/2016              Requested by: Nils Pyle, MD [address] [place], [place] [address] PCP: Elige Radon Dettinger, MD   Assessment & Plan: Visit Diagnoses:  1. Chronic right-sided low back pain with right-sided sciatica           (above diagnosis placed to obtain x-rays) 2.   History of low back pain, chronic without radiculopathy. Plan: History of back symptoms. Plain radiographs show well maintained disc space height and minimal facet degenerative changes. He has no evidence of radiculopathy or myelopathy on exam. He can continue to lose an additional 10-20 pounds as he's been doing. Continue with core strengthening exercises. Office follow-up of her develops radicular symptoms. At the current time no indications for further imaging. I congratulated him on his weight loss encouraged him to continue on wellness plans with his diabetic control etc. Thank the opportunity to see him in consultation  Follow-Up Instructions: No Follow-up on file.   Orders:  Orders Placed This Encounter  Procedures  . XR Lumbar Spine 2-3 Views   No orders of the defined types were placed in this encounter.     Procedures: No procedures performed   Clinical Data: No additional findings.   Subjective: Chief Complaint  Patient presents with  . Lower Back - Pain    HPI patient's and chronic back pain for years he states sometimes it has pain radiates from lumbosacral junction down to his knees occasionally some tingling. He is use the meloxicam and Flexeril. He is 10% service-connected for his back. He's had x-rays in the past whenever asked to get to see them. He denies associated bowel or bladder symptoms. He is a diabetic but is  hemoglobin A1c is 5.6 patient reports.  Review of Systems 40 impervious systems the reviewed. 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 He is on some furniture moving from his wife and has some increased back pain. Positive for anxiety depression GERD, hypertension, type 2 diabetes and hyperlipidemia. Patient used to smoke but quit years ago. He does take lithium 2 times a day for depression.   Objective: Vital Signs: BP 125/76   Pulse 62   Ht 6' (1.829 m)   Wt 240 lb (108.9 kg)   BMI 32.55 kg/m   Physical Exam  Constitutional: He is oriented to person, place, and time. He appears well-developed and well-nourished.  HENT:  Head: Normocephalic and atraumatic.  Eyes: EOM are normal. Pupils are equal, round, and reactive to light.  Neck: No tracheal deviation present. No thyromegaly present.  Cardiovascular: Normal rate.   Pulmonary/Chest: Effort normal. He has no wheezes.  Abdominal: Soft. Bowel sounds are normal.  Musculoskeletal:  No pitting edema normal hip range of motion straight leg raising negative to 90 he can heel and toe walk anterior tib EHL is strong the quad weakness. Weight is 240 (he states she's lost 20 pounds in the last year) and ankle jerk are rest symmetrical right and left. No hip flexion weakness. Slight tenderness to palpation lumbosacral junction and is tender over his SI joints. Negative Faber test. No quad atrophy.  Neurological: He is alert and oriented to person, place, and time.  Skin:  Skin is warm and dry. Capillary refill takes less than 2 seconds.  Psychiatric: He has a normal mood and affect. His behavior is normal. Judgment and thought content normal.    Ortho Exam  Specialty Comments:  No specialty comments available.  Imaging: No results found.   PMFS History: Patient Active Problem List   Diagnosis Date Noted  . Type 2 diabetes mellitus without complication, without long-term current use of insulin (HCC) 09/06/2015  . Hyperlipidemia LDL goal <100 09/06/2015  . Essential hypertension, benign 04/14/2015  . GERD (gastroesophageal  reflux disease) 04/14/2015  . Seasonal allergies 04/14/2015  . Anxiety and depression 04/14/2015  . Degenerative disc disease, lumbar 04/14/2015  . Bipolar 1 disorder, mixed, moderate (HCC) 04/14/2015   Past Medical History:  Diagnosis Date  . Acid reflux   . Anxiety 01/1998  . Cataract 02/2004  . Depression 01/1998  . Diabetes mellitus without complication (HCC) 12/1999  . Diabetes mellitus, type II (HCC)   . Hyperlipidemia 11/2006  . Hypertension   . Hypertension     Family History  Problem Relation Age of Onset  . Cancer Mother 42    lung  . Heart disease Father   . Hyperlipidemia Father   . Diabetes Brother   . Leukemia Brother   . Diabetes Paternal Grandmother   . Heart disease Paternal Grandmother   . Alcohol abuse Paternal Grandfather   . Diabetes Brother   . Depression Maternal Aunt     History reviewed. No pertinent surgical history. Social History   Occupational History  . Not on file.   Social History Main Topics  . Smoking status: Former Smoker    Packs/day: 1.00    Years: 12.00    Types: Cigarettes    Start date: 01/05/1978    Quit date: 12/28/1989  . Smokeless tobacco: Former Neurosurgeon  . Alcohol use No     Comment: 03-26-16 per pt no  . Drug use: No     Comment: 03-26-16 per pt currently no but use to  . Sexual activity: No

## 2016-11-06 ENCOUNTER — Encounter (HOSPITAL_COMMUNITY): Payer: Self-pay | Admitting: Psychiatry

## 2016-11-06 ENCOUNTER — Ambulatory Visit (INDEPENDENT_AMBULATORY_CARE_PROVIDER_SITE_OTHER): Admitting: Psychiatry

## 2016-11-06 VITALS — BP 112/75 | HR 69 | Ht 72.0 in | Wt 246.0 lb

## 2016-11-06 DIAGNOSIS — Z79899 Other long term (current) drug therapy: Secondary | ICD-10-CM

## 2016-11-06 DIAGNOSIS — Z818 Family history of other mental and behavioral disorders: Secondary | ICD-10-CM

## 2016-11-06 DIAGNOSIS — F3162 Bipolar disorder, current episode mixed, moderate: Secondary | ICD-10-CM

## 2016-11-06 DIAGNOSIS — Z87891 Personal history of nicotine dependence: Secondary | ICD-10-CM

## 2016-11-06 DIAGNOSIS — Z811 Family history of alcohol abuse and dependence: Secondary | ICD-10-CM | POA: Diagnosis not present

## 2016-11-06 MED ORDER — DULOXETINE HCL 60 MG PO CPEP: 60.0000 mg | ORAL_CAPSULE | Freq: Every day | ORAL | 2 refills | Status: DC

## 2016-11-06 MED ORDER — LITHIUM CARBONATE ER 450 MG PO TBCR: 900.0000 mg | EXTENDED_RELEASE_TABLET | Freq: Every day | ORAL | 2 refills | Status: DC

## 2016-11-06 MED ORDER — CLONAZEPAM 0.5 MG PO TABS: 0.5000 mg | ORAL_TABLET | Freq: Two times a day (BID) | ORAL | 2 refills | Status: DC | PRN

## 2016-11-06 NOTE — Progress Notes (Signed)
Psychiatric Initial Adult Assessment   Patient Identification: Adam Oneal. Mosqueda MRN:  [id number] Date of Evaluation:  11/06/2016 [facility]erral [facility] Chief Complaint:   Chief Complaint    Manic Behavior; Depression; Anxiety; Follow-up     Visit Diagnosis:    ICD-9-CM ICD-10-CM   1. Bipolar 1 disorder, mixed, moderate (HCC) 296.62 F31.62 Lithium level    History of Present Illness:  This patient is a 56 year old married white male who lives with his wife in [place]. 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 He moved here 10 months ago from [place]. The couple have no children but recently took in a 58 year old male who needed a place to live.  The patient was referred by his primary care provider from [place] him family medicine for further assessment and treatment of bipolar disorder.  The patient is somewhat of a vague historian and isn't quite sure how he got diagnosed with bipolar disorder. He states that he has had "mood swings" all his life. He went into the [employer] at 56 years of age and got out 62 years later. He states that his mood was up and down throughout the whole time but he dealt with that by drinking quite a bit of alcohol as well as caffeine during the day. 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 His moods are "up and down" he states that at times he does have symptoms of overt mania such as inability to sleep and hypersexuality.  In the fall and winter he becomes more depressed and tired with low energy and motivation. He has been on lithium for a number of years in his current level is 0.6. Since he moved here he has had to work the night shift for the first time in his life. He has difficulty sleeping during the day and also doesn't remember to take off his medications. He states that he tends to obsess and wants his house spotlessly clean. At  times he rants and raves about situations at work but would never really hurt anybody or himself. He's never had psychiatric inpatient treatment. His wife describes him as "very anxious" and unhappy with his job. He calls in sick about once a month. He has no friends and doesn't really trust other people. 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 He missed an appointment because his wife was very ill in the hospital. He admits that he went off his lithium for while but got very angry irritable and hypertalkative so he went back on it. He's very anxious at times when he has to go to work. I suggested he take his clonazepam right before work and he agrees. He now has a better schedule and feels more comfortable. He denies being depressed or suicidal. He had recent lab work and his renal function everything also good but I warned him about taking meloxicam with lithium as it can increase the lithium level.  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  Consequences of Substance Abuse: NA  Past Medical History:  Past Medical History:  Diagnosis Date  . Acid reflux   . Anxiety 01/1998  . Cataract 02/2004  . Depression 01/1998  . Diabetes mellitus without complication ([facility]) 34/7425  . Diabetes  mellitus, type II ([facility])   . Hyperlipidemia 11/2006  . Hypertension   . Hypertension    No past surgical history on file.  Family Psychiatric History: He describes his father as "very OCD" he denies any other mental illness in the family  Family History:  Family History  Problem Relation Age of Onset  . Cancer Mother 58    lung  . Heart disease Father   . Hyperlipidemia Father   . Diabetes Brother   . Leukemia Brother   . Diabetes Paternal Grandmother   . Heart disease Paternal Grandmother   . Alcohol abuse Paternal Grandfather   . Diabetes Brother   . Depression Maternal Aunt     Social History:   Social History   Social History  . Marital status: Married    Spouse name: N/A  . Number of children: N/A  . Years of education: N/A   Social History Main Topics  . Smoking status: Former Smoker    Packs/day: 1.00    Years: 12.00    Types: Cigarettes    Start date: 01/05/1978    Quit date: 12/28/1989  . Smokeless tobacco: Former Systems developer  . Alcohol use No     Comment: 03-26-16 per pt no  . Drug use: No     Comment: 03-26-16 per pt currently no but use to  . Sexual activity: No   Other Topics Concern  . Not on file   Social History Narrative  . No narrative on file    Additional Social History: Patient grew up with both parents 2 brothers and one sister. His father was a Ecologist and the family moved a lot around the [place]. At age 33 the patient joined the [employer] and stayed in for 20 years. He has been with postal service for 18 years. He is married but has no children  Allergies:   Allergies  Allergen Reactions  . Lipitor [Atorvastatin] Shortness Of Breath    Metabolic Disorder Labs: Lab Results  Component Value Date   HGBA1C 6.1 09/06/2015   No results found for: PROLACTIN Lab Results  Component Value Date   CHOL 146 01/18/2016   TRIG 96 01/18/2016   HDL 55 01/18/2016   CHOLHDL 2.7 01/18/2016   LDLCALC 72 01/18/2016   LDLCALC 60 04/14/2015      Current Medications: Current Outpatient Prescriptions  Medication Sig Dispense Refill  . cetirizine (ZYRTEC) 10 MG tablet TAKE 1 TABLET DAILY 90 tablet 1  . clonazePAM (KLONOPIN) 0.5 MG tablet Take 1 tablet (0.5 mg total) by mouth 2 (two) times daily as needed for anxiety. 60 tablet 2  . cyclobenzaprine (FLEXERIL) 10 MG tablet Take 1 tablet (10 mg total) by mouth 3 (three) times daily as needed for muscle spasms. 270 tablet 2  . DULoxetine (CYMBALTA) 60 MG capsule Take 1 capsule (60 mg total) by mouth daily. 90 capsule 2  . fluticasone (FLONASE) 50 MCG/ACT nasal spray Place 2 sprays into both nostrils  daily. 16 g 11  . lisinopril (PRINIVIL,ZESTRIL) 10 MG tablet Take 1 tablet (10 mg total) by mouth daily. 90 tablet 1  . lithium carbonate (ESKALITH) 450 MG CR tablet Take 2 tablets (900 mg total) by mouth at bedtime. 180 tablet 2  . meloxicam (MOBIC) 15 MG tablet TAKE 1 TABLET DAILY 90 tablet 0  . mometasone (NASONEX) 50 MCG/ACT nasal spray Place 2 sprays into the nose daily.    . NON FORMULARY CBD oil     No current facility-administered medications for this visit.     Neurologic: Headache: No Seizure: No Paresthesias:No  Musculoskeletal: Strength & Muscle Tone: within normal limits Gait & Station: normal Patient leans: N/A  Psychiatric Specialty Exam: Review of Systems  Constitutional: Positive for malaise/fatigue.  Musculoskeletal: Positive for back pain and myalgias.  Psychiatric/Behavioral: Positive for depression. The patient is nervous/anxious and has insomnia.     Blood pressure 112/75, pulse 69, height 6' (1.829 m), weight 246 lb (111.6 kg), SpO2 97 %.Body mass index is 33.36 kg/m.  General Appearance: Casual and Fairly Groomed  Eye Contact:  Good  Speech:  Clear and Coherent  Volume:  Normal  Mood: Fairly good   Affect:  Bright   Thought Process:  Goal Directed  Orientation:  Full (Time, Place, and Person)  Thought Content:  Obsessions and Rumination   Suicidal Thoughts:  No  Homicidal Thoughts:  No  Memory:  Immediate;   Good  Judgement:  Fair  Insight:  Fair  Psychomotor Activity:  Normal  Concentration:  Concentration: Good and Attention Span: Good  Recall:  Trent of Knowledge:Good  Language: Good  Akathisia:  No  Handed: Right   AIMS (if indicated):    Assets:  Communication Skills Desire for Improvement Physical Health Resilience Social Support Talents/Skills  ADL's:  Intact  Cognition: Intact   Sleep:  Up and down     Treatment Plan Summary: Medication management   The patient will continue lithium CR 900 mg at bedtime.And Cymbalta 60 mg daily both for mood stabilization and depression. He will start clonazepam 0.5 mg twice a day as needed. He'll return in 3 months and we will check a lithium level today   Levonne Spiller, MD 4/10/201811:23 AMPatient ID: Rose Fillers. Zaino, male   DOB: 1961/05/09, 56 y.o.   MRN: [id number]

## 2016-11-07 LAB — LITHIUM LEVEL: Lithium Lvl: 0.5 mmol/L — ABNORMAL LOW (ref 0.80–1.40)

## 2016-11-30 ENCOUNTER — Encounter: Payer: Self-pay | Admitting: Family

## 2016-11-30 ENCOUNTER — Ambulatory Visit (INDEPENDENT_AMBULATORY_CARE_PROVIDER_SITE_OTHER): Admitting: Family

## 2016-11-30 VITALS — BP 130/86 | HR 67 | Temp 98.7°F | Ht 72.0 in | Wt 243.4 lb

## 2016-11-30 DIAGNOSIS — R197 Diarrhea, unspecified: Secondary | ICD-10-CM

## 2016-11-30 DIAGNOSIS — R110 Nausea: Secondary | ICD-10-CM

## 2016-11-30 DIAGNOSIS — R1032 Left lower quadrant pain: Secondary | ICD-10-CM | POA: Diagnosis not present

## 2016-11-30 MED ORDER — ONDANSETRON HCL 4 MG PO TABS: 4.0000 mg | ORAL_TABLET | Freq: Three times a day (TID) | ORAL | 0 refills | Status: DC | PRN

## 2016-11-30 NOTE — Progress Notes (Signed)
   Subjective:    Patient ID: Adam Oneal, male    DOB: [birth year], 56 y.o.   MRN: [id number]  Diarrhea   This is a new problem. The current episode started in the past 7 days. The problem occurs 5 to 10 times per day. The problem has been rapidly improving. The stool consistency is described as mucous and watery. The patient states that diarrhea awakens him from sleep. Associated symptoms include abdominal pain, bloating, increased flatus, myalgias and vomiting. Pertinent negatives include no chills or fever. There are no known risk factors. He has tried increased fluids for the symptoms. The treatment provided mild relief.  Abdominal Pain  Associated symptoms include diarrhea, flatus, myalgias and vomiting. Pertinent negatives include no fever.      Review of Systems  Constitutional: Negative for chills and fever.  Gastrointestinal: Positive for abdominal pain, bloating, diarrhea, flatus and vomiting.  Musculoskeletal: Positive for myalgias.  All other systems reviewed and are negative.      Objective:   Physical Exam  Constitutional: He is oriented to person, place, and time. He appears well-developed and well-nourished. No distress.  HENT:  Head: Normocephalic.  Cardiovascular: Normal rate, regular rhythm, normal heart sounds and intact distal pulses.   No murmur heard. Pulmonary/Chest: Effort normal and breath sounds normal. No respiratory distress. He has no wheezes.  Abdominal: Soft. Bowel sounds are normal. He exhibits no distension. There is tenderness (LLQ tenderness).  Musculoskeletal: Normal range of motion. He exhibits no edema or tenderness.  Neurological: He is alert and oriented to person, place, and time.  Skin: Skin is warm and dry. No rash noted. No erythema.  Psychiatric: He has a normal mood and affect. His behavior is normal. Judgment and thought content normal.  Vitals reviewed.     BP 130/86   Pulse 67   Temp 98.7 F (37.1 C) (Oral)   Ht 6' (1.829  m)   Wt 243 lb 6.4 oz (110.4 kg)   BMI 33.01 kg/m      Assessment & Plan:  1. LLQ abdominal pain - CBC with Differential/Platelet  2. Diarrhea, unspecified type - CBC with Differential/Platelet  3. Nausea - CBC with Differential/Platelet - ondansetron (ZOFRAN) 4 MG tablet; Take 1 tablet (4 mg total) by mouth every 8 (eight) hours as needed for nausea or vomiting.  Dispense: 20 tablet; Refill: 0  Force fluids Bland diet Imodium as needed  Zofran Prescription sent to pharmacy  Viral vs diverticulitis? RTO as needed and if abdominal pain worsens or does not improve    Jannifer Rodneyhristy Greenley Martone, FNP

## 2016-11-30 NOTE — Patient Instructions (Signed)

## 2016-12-03 ENCOUNTER — Encounter: Payer: Self-pay | Admitting: Physician Assistant

## 2016-12-03 ENCOUNTER — Ambulatory Visit (INDEPENDENT_AMBULATORY_CARE_PROVIDER_SITE_OTHER): Admitting: Physician Assistant

## 2016-12-03 VITALS — BP 131/88 | HR 83 | Temp 99.7°F | Ht 72.0 in | Wt 239.2 lb

## 2016-12-03 DIAGNOSIS — R5081 Fever presenting with conditions classified elsewhere: Secondary | ICD-10-CM | POA: Diagnosis not present

## 2016-12-03 DIAGNOSIS — F3162 Bipolar disorder, current episode mixed, moderate: Secondary | ICD-10-CM | POA: Diagnosis not present

## 2016-12-03 DIAGNOSIS — R3911 Hesitancy of micturition: Secondary | ICD-10-CM

## 2016-12-03 DIAGNOSIS — N410 Acute prostatitis: Secondary | ICD-10-CM | POA: Diagnosis not present

## 2016-12-03 DIAGNOSIS — R339 Retention of urine, unspecified: Secondary | ICD-10-CM | POA: Diagnosis not present

## 2016-12-03 DIAGNOSIS — N401 Enlarged prostate with lower urinary tract symptoms: Secondary | ICD-10-CM | POA: Diagnosis not present

## 2016-12-03 LAB — URINALYSIS, COMPLETE
Bilirubin, UA: NEGATIVE
Glucose, UA: NEGATIVE
Ketones, UA: NEGATIVE
LEUKOCYTES UA: NEGATIVE
NITRITE UA: NEGATIVE
Specific Gravity, UA: 1.015 (ref 1.005–1.030)
Urobilinogen, Ur: 0.2 mg/dL (ref 0.2–1.0)
pH, UA: 7 (ref 5.0–7.5)

## 2016-12-03 LAB — MICROSCOPIC EXAMINATION
BACTERIA UA: NONE SEEN
EPITHELIAL CELLS (NON RENAL): NONE SEEN /HPF (ref 0–10)
Renal Epithel, UA: NONE SEEN /hpf
WBC, UA: NONE SEEN /hpf (ref 0–?)

## 2016-12-03 MED ORDER — CIPROFLOXACIN HCL 500 MG PO TABS: 500.0000 mg | ORAL_TABLET | Freq: Two times a day (BID) | ORAL | 0 refills | Status: DC

## 2016-12-04 LAB — CBC WITH DIFFERENTIAL/PLATELET
Basophils Absolute: 0 10*3/uL (ref 0.0–0.2)
Basos: 0 %
EOS (ABSOLUTE): 0.2 10*3/uL (ref 0.0–0.4)
EOS: 2 %
HEMATOCRIT: 47.4 % (ref 37.5–51.0)
HEMOGLOBIN: 16.2 g/dL (ref 13.0–17.7)
Immature Grans (Abs): 0 10*3/uL (ref 0.0–0.1)
Immature Granulocytes: 0 %
LYMPHS ABS: 3.5 10*3/uL — AB (ref 0.7–3.1)
Lymphs: 32 %
MCH: 30.1 pg (ref 26.6–33.0)
MCHC: 34.2 g/dL (ref 31.5–35.7)
MCV: 88 fL (ref 79–97)
MONOCYTES: 14 %
MONOS ABS: 1.5 10*3/uL — AB (ref 0.1–0.9)
Neutrophils Absolute: 5.6 10*3/uL (ref 1.4–7.0)
Neutrophils: 52 %
PLATELETS: 351 10*3/uL (ref 150–379)
RBC: 5.38 x10E6/uL (ref 4.14–5.80)
RDW: 13.4 % (ref 12.3–15.4)
WBC: 10.9 10*3/uL — ABNORMAL HIGH (ref 3.4–10.8)

## 2016-12-04 LAB — CMP14+EGFR
A/G RATIO: 1.7 (ref 1.2–2.2)
ALK PHOS: 105 IU/L (ref 39–117)
ALT: 24 IU/L (ref 0–44)
AST: 22 IU/L (ref 0–40)
Albumin: 4.7 g/dL (ref 3.5–5.5)
BILIRUBIN TOTAL: 0.5 mg/dL (ref 0.0–1.2)
BUN/Creatinine Ratio: 16 (ref 9–20)
BUN: 15 mg/dL (ref 6–24)
CALCIUM: 9.9 mg/dL (ref 8.7–10.2)
CHLORIDE: 104 mmol/L (ref 96–106)
CO2: 16 mmol/L — ABNORMAL LOW (ref 18–29)
Creatinine, Ser: 0.91 mg/dL (ref 0.76–1.27)
GFR calc Af Amer: 108 mL/min/{1.73_m2} (ref >59)
GFR calc non Af Amer: 93 mL/min/{1.73_m2} (ref >59)
Globulin, Total: 2.8 g/dL (ref 1.5–4.5)
Glucose: 110 mg/dL — ABNORMAL HIGH (ref 65–99)
POTASSIUM: 5 mmol/L (ref 3.5–5.2)
Sodium: 143 mmol/L (ref 134–144)
Total Protein: 7.5 g/dL (ref 6.0–8.5)

## 2016-12-04 LAB — TSH: TSH: 2.27 u[IU]/mL (ref 0.450–4.500)

## 2016-12-04 LAB — PSA: PROSTATE SPECIFIC AG, SERUM: 0.6 ng/mL (ref 0.0–4.0)

## 2016-12-05 LAB — URINE CULTURE: Organism ID, Bacteria: NO GROWTH

## 2016-12-05 NOTE — Progress Notes (Signed)
BP 131/88   Pulse 83   Temp 99.7 F (37.6 C) (Oral)   Ht 6' (1.829 m)   Wt 239 lb 3.2 oz (108.5 kg)   BMI 32.44 kg/m    Subjective:    Patient ID: Adam Oneal. Schetter, male    DOB: June 01, 1961, 56 y.o.   MRN: [id number]  HPI: Adam Oneal. Adam Oneal is a 56 y.o. male presenting on 12/03/2016 for Back Pain; trouble urinating; and Excessive Sweating  For the past 5-7 days patient had had a couple illnesses. At the beginning he was having severe nausea and vomiting. He was seen in this office. They felt he had a gastroenteritis. However since then he has continued to have more fever. He has had decreased urine output. He is not dehydrated at this time. He feels that over the past 5-6 months he has had increasing prostate symptoms of urinary blockage and retention. He does have nocturia 2 or 3 times per night. He is having currently back pain and trouble urinating. He is profusely sweating at times. He does have a mild fever today at 99 7. All of his medications are reviewed today. He is also missed a lot of his medications from being sick. Many of them do include his bipolar medications and Klonopin. I let him know sometimes withdrawing from these medications can add to the body aches and sweating. He is going to try to get these started back as soon as possible.  Relevant past medical, surgical, family and social history reviewed and updated as indicated. Allergies and medications reviewed and updated.  Past Medical History:  Diagnosis Date  . Acid reflux   . Anxiety 01/1998  . Cataract 02/2004  . Depression 01/1998  . Diabetes mellitus without complication ([place]) 22/4497  . Diabetes mellitus, type II ([place])   . Hyperlipidemia 11/2006  . Hypertension   . Hypertension     History reviewed. No pertinent surgical history.  Review of Systems  Constitutional: Positive for activity change, fatigue and fever. Negative for appetite change.  HENT: Positive for congestion and sore throat. Negative for  sinus pressure.   Eyes: Negative.  Negative for pain and visual disturbance.  Respiratory: Negative for cough, chest tightness, shortness of breath and wheezing.   Cardiovascular: Negative.  Negative for chest pain, palpitations and leg swelling.  Gastrointestinal: Positive for nausea. Negative for abdominal pain, diarrhea and vomiting.  Endocrine: Negative.   Genitourinary: Positive for decreased urine volume, difficulty urinating, frequency and urgency. Negative for discharge, dysuria, enuresis, hematuria, penile pain and testicular pain.  Musculoskeletal: Positive for back pain and myalgias. Negative for arthralgias.  Skin: Negative.  Negative for color change and rash.  Neurological: Positive for headaches. Negative for weakness and numbness.  Psychiatric/Behavioral: Negative.     Allergies as of 12/03/2016      Reactions   Lipitor [atorvastatin] Shortness Of Breath      Medication List       Accurate as of 12/03/16 11:59 PM. Always use your most recent med list.          cetirizine 10 MG tablet Commonly known as:  ZYRTEC TAKE 1 TABLET DAILY   ciprofloxacin 500 MG tablet Commonly known as:  CIPRO Take 1 tablet (500 mg total) by mouth 2 (two) times daily.   clonazePAM 0.5 MG tablet Commonly known as:  KLONOPIN Take 1 tablet (0.5 mg total) by mouth 2 (two) times daily as needed for anxiety.   cyclobenzaprine 10 MG tablet Commonly known as:  FLEXERIL Take 1 tablet (10 mg total) by mouth 3 (three) times daily as needed for muscle spasms.   DULoxetine 60 MG capsule Commonly known as:  CYMBALTA Take 1 capsule (60 mg total) by mouth daily.   fluticasone 50 MCG/ACT nasal spray Commonly known as:  FLONASE Place 2 sprays into both nostrils daily.   lisinopril 10 MG tablet Commonly known as:  PRINIVIL,ZESTRIL Take 1 tablet (10 mg total) by mouth daily.   lithium carbonate 450 MG CR tablet Commonly known as:  ESKALITH Take 2 tablets (900 mg total) by mouth at bedtime.     meloxicam 15 MG tablet Commonly known as:  MOBIC TAKE 1 TABLET DAILY   mometasone 50 MCG/ACT nasal spray Commonly known as:  NASONEX Place 2 sprays into the nose daily.   ondansetron 4 MG tablet Commonly known as:  ZOFRAN Take 1 tablet (4 mg total) by mouth every 8 (eight) hours as needed for nausea or vomiting.          Objective:    BP 131/88   Pulse 83   Temp 99.7 F (37.6 C) (Oral)   Ht 6' (1.829 m)   Wt 239 lb 3.2 oz (108.5 kg)   BMI 32.44 kg/m   Allergies  Allergen Reactions  . Lipitor [Atorvastatin] Shortness Of Breath    Physical Exam  Constitutional: He is oriented to person, place, and time. He appears well-developed and well-nourished. He appears distressed.  HENT:  Head: Normocephalic and atraumatic.  Right Ear: Tympanic membrane normal. No drainage. No middle ear effusion.  Left Ear: Tympanic membrane normal. No drainage.  No middle ear effusion.  Nose: Mucosal edema and rhinorrhea present. Right sinus exhibits no maxillary sinus tenderness. Left sinus exhibits no maxillary sinus tenderness.  Mouth/Throat: Uvula is midline. Posterior oropharyngeal erythema present. No oropharyngeal exudate.  Eyes: Conjunctivae and EOM are normal. Pupils are equal, round, and reactive to light. Right eye exhibits no discharge. Left eye exhibits no discharge.  Neck: Normal range of motion.  Cardiovascular: Normal rate, regular rhythm and normal heart sounds.   Pulmonary/Chest: Effort normal and breath sounds normal. No respiratory distress. He has no wheezes.  Abdominal: Soft. He exhibits no mass. There is tenderness. There is no rebound and no guarding.  Lymphadenopathy:    He has no cervical adenopathy.  Neurological: He is alert and oriented to person, place, and time.  Skin: Skin is warm. He is diaphoretic.  Psychiatric: He has a normal mood and affect. His behavior is normal.  Nursing note and vitals reviewed.   Results for orders placed or performed in visit on  12/03/16  Urine culture  Result Value Ref Range   Urine Culture, Routine Final report    Urine Culture result 1 No growth   Microscopic Examination  Result Value Ref Range   WBC, UA None seen 0 - 5 /hpf   RBC, UA 0-2 0 - 2 /hpf   Epithelial Cells (non renal) None seen 0 - 10 /hpf   Renal Epithel, UA None seen None seen /hpf   Mucus, UA Present Not Estab.   Bacteria, UA None seen None seen/Few  CBC with Differential/Platelet  Result Value Ref Range   WBC 10.9 (H) 3.4 - 10.8 x10E3/uL   RBC 5.38 4.14 - 5.80 x10E6/uL   Hemoglobin 16.2 13.0 - 17.7 g/dL   Hematocrit 47.4 37.5 - 51.0 %   MCV 88 79 - 97 fL   MCH 30.1 26.6 - 33.0 pg   MCHC 34.2  31.5 - 35.7 g/dL   RDW 13.4 12.3 - 15.4 %   Platelets 351 150 - 379 x10E3/uL   Neutrophils 52 Not Estab. %   Lymphs 32 Not Estab. %   Monocytes 14 Not Estab. %   Eos 2 Not Estab. %   Basos 0 Not Estab. %   Neutrophils Absolute 5.6 1.4 - 7.0 x10E3/uL   Lymphocytes Absolute 3.5 (H) 0.7 - 3.1 x10E3/uL   Monocytes Absolute 1.5 (H) 0.1 - 0.9 x10E3/uL   EOS (ABSOLUTE) 0.2 0.0 - 0.4 x10E3/uL   Basophils Absolute 0.0 0.0 - 0.2 x10E3/uL   Immature Granulocytes 0 Not Estab. %   Immature Grans (Abs) 0.0 0.0 - 0.1 x10E3/uL  CMP14+EGFR  Result Value Ref Range   Glucose 110 (H) 65 - 99 mg/dL   BUN 15 6 - 24 mg/dL   Creatinine, Ser 0.91 0.76 - 1.27 mg/dL   GFR calc non Af Amer 93 >59 mL/min/1.73   GFR calc Af Amer 108 >59 mL/min/1.73   BUN/Creatinine Ratio 16 9 - 20   Sodium 143 134 - 144 mmol/L   Potassium 5.0 3.5 - 5.2 mmol/L   Chloride 104 96 - 106 mmol/L   CO2 16 (L) 18 - 29 mmol/L   Calcium 9.9 8.7 - 10.2 mg/dL   Total Protein 7.5 6.0 - 8.5 g/dL   Albumin 4.7 3.5 - 5.5 g/dL   Globulin, Total 2.8 1.5 - 4.5 g/dL   Albumin/Globulin Ratio 1.7 1.2 - 2.2   Bilirubin Total 0.5 0.0 - 1.2 mg/dL   Alkaline Phosphatase 105 39 - 117 IU/L   AST 22 0 - 40 IU/L   ALT 24 0 - 44 IU/L  TSH  Result Value Ref Range   TSH 2.270 0.450 - 4.500 uIU/mL    Urinalysis, Complete  Result Value Ref Range   Specific Gravity, UA 1.015 1.005 - 1.030   pH, UA 7.0 5.0 - 7.5   Color, UA Yellow Yellow   Appearance Ur Clear Clear   Leukocytes, UA Negative Negative   Protein, UA 1+ (A) Negative/Trace   Glucose, UA Negative Negative   Ketones, UA Negative Negative   RBC, UA Trace (A) Negative   Bilirubin, UA Negative Negative   Urobilinogen, Ur 0.2 0.2 - 1.0 mg/dL   Nitrite, UA Negative Negative   Microscopic Examination See below:   PSA  Result Value Ref Range   Prostate Specific Ag, Serum 0.6 0.0 - 4.0 ng/mL      Assessment & Plan:   1. Fever in other diseases - CBC with Differential/Platelet - CMP14+EGFR - TSH - Urinalysis, Complete - Urine culture  2. Benign prostatic hyperplasia with urinary hesitancy - PSA  3. Urinary retention - ciprofloxacin (CIPRO) 500 MG tablet; Take 1 tablet (500 mg total) by mouth 2 (two) times daily.  Dispense: 28 tablet; Refill: 0 - Urinalysis, Complete - Urine culture - Microscopic Examination  4. Acute prostatitis - CBC with Differential/Platelet - Urinalysis, Complete - Urine culture - PSA  5. Bipolar 1 disorder, mixed, moderate ([facility]) Restart meds. Could be having some withdrawal since missing them while vomiting so much.   Current Outpatient Prescriptions:  .  cetirizine (ZYRTEC) 10 MG tablet, TAKE 1 TABLET DAILY, Disp: 90 tablet, Rfl: 1 .  clonazePAM (KLONOPIN) 0.5 MG tablet, Take 1 tablet (0.5 mg total) by mouth 2 (two) times daily as needed for anxiety., Disp: 60 tablet, Rfl: 2 .  cyclobenzaprine (FLEXERIL) 10 MG tablet, Take 1 tablet (10 mg total) by mouth  3 (three) times daily as needed for muscle spasms., Disp: 270 tablet, Rfl: 2 .  DULoxetine (CYMBALTA) 60 MG capsule, Take 1 capsule (60 mg total) by mouth daily., Disp: 90 capsule, Rfl: 2 .  fluticasone (FLONASE) 50 MCG/ACT nasal spray, Place 2 sprays into both nostrils daily., Disp: 16 g, Rfl: 11 .  lisinopril (PRINIVIL,ZESTRIL) 10 MG  tablet, Take 1 tablet (10 mg total) by mouth daily., Disp: 90 tablet, Rfl: 1 .  lithium carbonate (ESKALITH) 450 MG CR tablet, Take 2 tablets (900 mg total) by mouth at bedtime., Disp: 180 tablet, Rfl: 2 .  meloxicam (MOBIC) 15 MG tablet, TAKE 1 TABLET DAILY, Disp: 90 tablet, Rfl: 0 .  mometasone (NASONEX) 50 MCG/ACT nasal spray, Place 2 sprays into the nose daily., Disp: , Rfl:  .  ondansetron (ZOFRAN) 4 MG tablet, Take 1 tablet (4 mg total) by mouth every 8 (eight) hours as needed for nausea or vomiting., Disp: 20 tablet, Rfl: 0 .  ciprofloxacin (CIPRO) 500 MG tablet, Take 1 tablet (500 mg total) by mouth 2 (two) times daily., Disp: 28 tablet, Rfl: 0  Continue all other maintenance medications as listed above.  Follow up plan: Keep follow up with PCP  Educational handout given for prostatitis  Terald Sleeper PA-C [facility] [address]  [place], [place] [address] [phone]   12/05/2016, 10:45 AM

## 2016-12-06 ENCOUNTER — Telehealth: Payer: Self-pay | Admitting: Family Medicine

## 2016-12-06 NOTE — Telephone Encounter (Signed)
refer to lab result note.

## 2016-12-08 ENCOUNTER — Other Ambulatory Visit: Payer: Self-pay | Admitting: *Deleted

## 2016-12-08 DIAGNOSIS — R39198 Other difficulties with micturition: Secondary | ICD-10-CM

## 2016-12-10 ENCOUNTER — Other Ambulatory Visit: Payer: Self-pay

## 2016-12-10 MED ORDER — MELOXICAM 15 MG PO TABS: 15.0000 mg | ORAL_TABLET | Freq: Every day | ORAL | 0 refills | Status: DC

## 2016-12-13 ENCOUNTER — Other Ambulatory Visit (HOSPITAL_COMMUNITY): Payer: Self-pay | Admitting: Psychiatry

## 2017-01-14 ENCOUNTER — Ambulatory Visit (INDEPENDENT_AMBULATORY_CARE_PROVIDER_SITE_OTHER): Admitting: Family Medicine

## 2017-01-14 ENCOUNTER — Encounter: Payer: Self-pay | Admitting: Family Medicine

## 2017-01-14 VITALS — BP 126/77 | HR 72 | Temp 97.6°F | Ht 72.0 in | Wt 235.0 lb

## 2017-01-14 DIAGNOSIS — M545 Low back pain, unspecified: Secondary | ICD-10-CM

## 2017-01-14 LAB — URINALYSIS, COMPLETE
BILIRUBIN UA: NEGATIVE
GLUCOSE, UA: NEGATIVE
KETONES UA: NEGATIVE
Leukocytes, UA: NEGATIVE
NITRITE UA: NEGATIVE
Protein, UA: NEGATIVE
RBC, UA: NEGATIVE
SPEC GRAV UA: 1.02 (ref 1.005–1.030)
Urobilinogen, Ur: 0.2 mg/dL (ref 0.2–1.0)
pH, UA: 7 (ref 5.0–7.5)

## 2017-01-14 LAB — MICROSCOPIC EXAMINATION
Bacteria, UA: NONE SEEN
EPITHELIAL CELLS (NON RENAL): NONE SEEN /HPF (ref 0–10)
RBC MICROSCOPIC, UA: NONE SEEN /HPF (ref 0–?)
RENAL EPITHEL UA: NONE SEEN /HPF
WBC UA: NONE SEEN /HPF (ref 0–?)

## 2017-01-14 NOTE — Progress Notes (Signed)
BP 126/77   Pulse 72   Temp 97.6 F (36.4 C) (Oral)   Ht 6' (1.829 m)   Wt 235 lb (106.6 kg)   BMI 31.87 kg/m    Subjective:    Patient ID: Adam Oneal, male    DOB: Dec 14, 1960, 56 y.o.   MRN: [id number]  HPI: Adam Oneal is a 56 y.o. male presenting on 01/14/2017 for Back Pain (lumbar pain, doing very strenous work)   HPI Low back pain Patient is coming in with low back pain that has been going on for the past few days. He has been doing very strenuous work but he was also concerned about his kidneys because he has had some renal stones previously and infections previously. He denies any dysuria but does have a little bit of frequency but has been drinking more water recently as well. He denies any burning with urination or blood in his urine. He denies any fevers or chills. It does hurt more than the right lower back/flank than the left. He rates the pain as 5-6 out of 10. He has muscle relaxer and some anti-inflammatories but has not used them yet because he wanted to make sure his kidneys are good.  Relevant past medical, surgical, family and social history reviewed and updated as indicated. Interim medical history since our last visit reviewed. Allergies and medications reviewed and updated.  Review of Systems  Constitutional: Negative for chills and fever.  Respiratory: Negative for shortness of breath and wheezing.   Cardiovascular: Negative for chest pain and leg swelling.  Gastrointestinal: Negative for abdominal pain, blood in stool, constipation, diarrhea, nausea and vomiting.  Genitourinary: Positive for flank pain and frequency. Negative for decreased urine volume, difficulty urinating, dysuria and hematuria.  Musculoskeletal: Positive for back pain. Negative for gait problem.  Skin: Negative for rash.  Neurological: Negative for dizziness, weakness, light-headedness and numbness.  All other systems reviewed and are negative.   Per HPI unless specifically  indicated above        Objective:    BP 126/77   Pulse 72   Temp 97.6 F (36.4 C) (Oral)   Ht 6' (1.829 m)   Wt 235 lb (106.6 kg)   BMI 31.87 kg/m   Wt Readings from Last 3 Encounters:  01/14/17 235 lb (106.6 kg)  12/03/16 239 lb 3.2 oz (108.5 kg)  11/30/16 243 lb 6.4 oz (110.4 kg)    Physical Exam  Constitutional: He is oriented to person, place, and time. He appears well-developed and well-nourished. No distress.  Eyes: Conjunctivae are normal. No scleral icterus.  Cardiovascular: Normal rate, regular rhythm, normal heart sounds and intact distal pulses.   No murmur heard. Pulmonary/Chest: Effort normal and breath sounds normal. No respiratory distress. He has no wheezes. He has no rales.  Abdominal: Soft. Bowel sounds are normal. He exhibits no distension. There is no tenderness. There is no rebound and no guarding.  Musculoskeletal: Normal range of motion. He exhibits no edema.       Lumbar back: He exhibits tenderness (Bilateral lower back tenderness, worse on right lower and left, no CVA tenderness on exam). He exhibits normal range of motion, no swelling, no edema, no deformity and normal pulse.  Neurological: He is alert and oriented to person, place, and time. Coordination normal.  Skin: Skin is warm and dry. No rash noted. He is not diaphoretic.  Psychiatric: He has a normal mood and affect. His behavior is normal.  Nursing note and vitals  reviewed.       Assessment & Plan:   Problem List Items Addressed This Visit    None    Visit Diagnoses    Acute bilateral low back pain without sciatica    -  Primary   Relevant Orders   Urinalysis, Complete       Follow up plan: Return if symptoms worsen or fail to improve.  Counseling provided for all of the vaccine components Orders Placed This Encounter  Procedures  . Urinalysis, Complete    Arville Care, MD [facility] 01/14/2017, 3:17 PM

## 2017-01-22 ENCOUNTER — Ambulatory Visit: Admitting: Family Medicine

## 2017-01-23 ENCOUNTER — Telehealth: Payer: Self-pay | Admitting: Family Medicine

## 2017-01-23 ENCOUNTER — Encounter: Payer: Self-pay | Admitting: Family Medicine

## 2017-01-23 NOTE — Telephone Encounter (Signed)
Left message for patient to call if he needed to reschedule.

## 2017-01-31 ENCOUNTER — Encounter: Payer: Self-pay | Admitting: Physician Assistant

## 2017-01-31 ENCOUNTER — Ambulatory Visit (INDEPENDENT_AMBULATORY_CARE_PROVIDER_SITE_OTHER): Admitting: Physician Assistant

## 2017-01-31 VITALS — BP 129/71 | HR 62 | Temp 98.7°F | Ht 72.0 in

## 2017-01-31 DIAGNOSIS — M545 Low back pain, unspecified: Secondary | ICD-10-CM

## 2017-01-31 DIAGNOSIS — R1032 Left lower quadrant pain: Secondary | ICD-10-CM

## 2017-01-31 NOTE — Patient Instructions (Signed)
Constipation, Adult °Constipation is when a person: °· Poops (has a bowel movement) fewer times in a week than normal. °· Has a hard time pooping. °· Has poop that is dry, hard, or bigger than normal. ° °Follow these instructions at home: °Eating and drinking ° °· Eat foods that have a lot of fiber, such as: °? Fresh fruits and vegetables. °? Whole grains. °? Beans. °· Eat less of foods that are high in fat, low in fiber, or overly processed, such as: °? French fries. °? Hamburgers. °? Cookies. °? Candy. °? Soda. °· Drink enough fluid to keep your pee (urine) clear or pale yellow. °General instructions °· Exercise regularly or as told by your doctor. °· Go to the restroom when you feel like you need to poop. Do not hold it in. °· Take over-the-counter and prescription medicines only as told by your doctor. These include any fiber supplements. °· Do pelvic floor retraining exercises, such as: °? Doing deep breathing while relaxing your lower belly (abdomen). °? Relaxing your pelvic floor while pooping. °· Watch your condition for any changes. °· Keep all follow-up visits as told by your doctor. This is important. °Contact a doctor if: °· You have pain that gets worse. °· You have a fever. °· You have not pooped for 4 days. °· You throw up (vomit). °· You are not hungry. °· You lose weight. °· You are bleeding from the anus. °· You have thin, pencil-like poop (stool). °Get help right away if: °· You have a fever, and your symptoms suddenly get worse. °· You leak poop or have blood in your poop. °· Your belly feels hard or bigger than normal (is bloated). °· You have very bad belly pain. °· You feel dizzy or you faint. °This information is not intended to replace advice given to you by your health care provider. Make sure you discuss any questions you have with your health care provider. °Document Released: 01/02/2008 Document Revised: 02/03/2016 Document Reviewed: 01/04/2016 °Elsevier Interactive Patient Education ©  2017 [employer] ° °

## 2017-01-31 NOTE — Progress Notes (Signed)
Subjective:     Patient ID: Adam Oneal, male   DOB: [birth year], 56 y.o.   MRN: [id number]  HPI Pt here with continued back pain Pt with hx of same and seen 2 weeks ago by Dr Dettinger Pain is to the L low back pain Some radiation anterior Sx worse with lying He has noted some issues with constipation over the last month Took OTC med and constipation sx improved but still having back pain  Review of Systems  Constitutional: Negative.   Gastrointestinal: Positive for abdominal distention, abdominal pain, blood in stool and constipation.  Genitourinary: Negative.   Musculoskeletal: Positive for back pain. Negative for gait problem.       Objective:   Physical Exam  Constitutional: He appears well-developed and well-nourished.  Abdominal: Soft. He exhibits no distension and no mass. There is no tenderness. There is no rebound and no guarding.  No CVAT  Nursing note and vitals reviewed. FROM of the L-spine- increase in sx with rotation to the R Sl TTP of the L L-spine No palp spasm SLR neg Muscle strength distal equal bilat DTR 1+/= lower ext Sensory good      Assessment:     1. Low back pain at multiple sites   2. LLQ abdominal pain        Plan:     Discussed concern regarding recent change in bowel habits, weight loss, and smoking hx Discussed colonoscopy which pt agreed to referral Also discussed possible abd work up which can be managed by the GI specialist Good diet reviewed OTC meds for sx F/U prn

## 2017-02-05 ENCOUNTER — Ambulatory Visit (HOSPITAL_COMMUNITY): Admitting: Psychiatry

## 2017-02-06 ENCOUNTER — Encounter: Payer: Self-pay | Admitting: Gastroenterology

## 2017-02-07 ENCOUNTER — Ambulatory Visit (INDEPENDENT_AMBULATORY_CARE_PROVIDER_SITE_OTHER): Admitting: Psychiatry

## 2017-02-07 ENCOUNTER — Encounter (HOSPITAL_COMMUNITY): Payer: Self-pay | Admitting: Psychiatry

## 2017-02-07 VITALS — BP 126/80 | HR 65 | Ht 72.0 in | Wt 240.4 lb

## 2017-02-07 DIAGNOSIS — Z87891 Personal history of nicotine dependence: Secondary | ICD-10-CM | POA: Diagnosis not present

## 2017-02-07 DIAGNOSIS — G4700 Insomnia, unspecified: Secondary | ICD-10-CM | POA: Diagnosis not present

## 2017-02-07 DIAGNOSIS — Z811 Family history of alcohol abuse and dependence: Secondary | ICD-10-CM

## 2017-02-07 DIAGNOSIS — F3162 Bipolar disorder, current episode mixed, moderate: Secondary | ICD-10-CM

## 2017-02-07 DIAGNOSIS — Z818 Family history of other mental and behavioral disorders: Secondary | ICD-10-CM | POA: Diagnosis not present

## 2017-02-07 MED ORDER — DULOXETINE HCL 60 MG PO CPEP: 60.0000 mg | ORAL_CAPSULE | Freq: Every day | ORAL | 2 refills | Status: DC

## 2017-02-07 MED ORDER — CLONAZEPAM 0.5 MG PO TABS: 0.5000 mg | ORAL_TABLET | Freq: Two times a day (BID) | ORAL | 2 refills | Status: DC | PRN

## 2017-02-07 MED ORDER — LITHIUM CARBONATE ER 450 MG PO TBCR: 900.0000 mg | EXTENDED_RELEASE_TABLET | Freq: Every day | ORAL | 2 refills | Status: DC
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
## 2017-03-06 ENCOUNTER — Encounter (HOSPITAL_COMMUNITY): Payer: Self-pay | Admitting: Psychiatry

## 2017-03-06 ENCOUNTER — Ambulatory Visit (INDEPENDENT_AMBULATORY_CARE_PROVIDER_SITE_OTHER): Admitting: Psychiatry

## 2017-03-06 VITALS — BP 113/67 | HR 60 | Ht 72.0 in | Wt 225.0 lb

## 2017-03-06 DIAGNOSIS — F3162 Bipolar disorder, current episode mixed, moderate: Secondary | ICD-10-CM | POA: Diagnosis not present

## 2017-03-06 DIAGNOSIS — G4700 Insomnia, unspecified: Secondary | ICD-10-CM

## 2017-03-06 DIAGNOSIS — Z87891 Personal history of nicotine dependence: Secondary | ICD-10-CM | POA: Diagnosis not present

## 2017-03-06 DIAGNOSIS — Z811 Family history of alcohol abuse and dependence: Secondary | ICD-10-CM | POA: Diagnosis not present

## 2017-03-06 DIAGNOSIS — Z818 Family history of other mental and behavioral disorders: Secondary | ICD-10-CM

## 2017-03-06 MED ORDER — HYDROXYZINE PAMOATE 25 MG PO CAPS: 25.0000 mg | ORAL_CAPSULE | Freq: Every day | ORAL | 2 refills | Status: DC

## 2017-03-06 MED ORDER — HYDROXYZINE PAMOATE 25 MG PO CAPS
25.0000 mg | ORAL_CAPSULE | Freq: Every day | ORAL | 2 refills | Status: DC
Start: 2017-03-06 — End: 2017-04-15
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
## 2017-03-08 ENCOUNTER — Telehealth (HOSPITAL_COMMUNITY): Payer: Self-pay | Admitting: *Deleted

## 2017-03-08 NOTE — Telephone Encounter (Signed)
Left message to get more clarification

## 2017-03-08 NOTE — Telephone Encounter (Signed)
Pt wife called stating pt employment is needing a letter for his work due to pt being suspended from work. Per pt wife, employment is stating pt was acting drunk and out of like he was on drugs. Per pt pt do not remember anything. Per pt wife someone said he had guns and he was going to bring it to work and they don't have firearms at home. Per pt wife when he got home he was acting the same way. Per pt wife Dr. Tenny Crawoss is aware and now pt went to work yesterday and not they are needing a letter. Pt wife number is [phone].

## 2017-03-12 ENCOUNTER — Encounter (HOSPITAL_COMMUNITY): Payer: Self-pay | Admitting: Psychiatry

## 2017-03-12 NOTE — Telephone Encounter (Signed)
Pt wife called back stating she did not received Dr. Tenny Crawoss message but she would like to speak with Dr. Tenny Crawoss. Per pt wife they need a letter that states in the letter that when taking this medication it may have an adverse reaction like acting irrational or drunk like. Per pt wife she would like to speak with Dr. Tenny Crawoss more about it. Pt wife number is [phone].

## 2017-03-12 NOTE — Telephone Encounter (Signed)
Letter completed.

## 2017-03-13 NOTE — Telephone Encounter (Signed)
Called number on file and lmtcb. Per provider pt need to fill out a release of information form for the facility that is going to have the letter before staff can give the letter to pt or his wife. Staff was unable to reach pt or his wife and lmtcb and office number was provided.

## 2017-03-14 ENCOUNTER — Telehealth (HOSPITAL_COMMUNITY): Payer: Self-pay | Admitting: *Deleted

## 2017-03-14 NOTE — Telephone Encounter (Signed)
Pt came into office to pick up letter he requested. Pt requested that staff mailed letter which will be mailed certified mail. Pt also requested a copy of the letter for his records which was given to him. Pt signed release of information and it was scanned into his chart.

## 2017-03-19 NOTE — Telephone Encounter (Signed)
Pt wife called back and she stated the place that needed the first letter did receive the letter. Per pt wife, she is trying to see if Dr. Tenny Craw could now write a letter stating when and if pt should return to work. Per pt wife pt have an appt for tomorrow 03-20-2017. Informed pt wife to discuss with provider during appt and pt wife verbalized understanding.

## 2017-03-19 NOTE — Telephone Encounter (Signed)
Pt and his wife came by office and filled out a release of information form and letter was mailed to the person that was listed on release of information. Pt and his wife are both aware. Letter was mailed certified.

## 2017-03-19 NOTE — Telephone Encounter (Signed)
Called pt number on file at [phone] and lm due to f.u on letter.

## 2017-03-20 ENCOUNTER — Encounter (HOSPITAL_COMMUNITY): Payer: Self-pay | Admitting: Psychiatry

## 2017-03-20 ENCOUNTER — Telehealth (HOSPITAL_COMMUNITY): Payer: Self-pay | Admitting: *Deleted

## 2017-03-20 ENCOUNTER — Ambulatory Visit (INDEPENDENT_AMBULATORY_CARE_PROVIDER_SITE_OTHER): Admitting: Psychiatry

## 2017-03-20 VITALS — BP 140/93 | HR 75 | Ht 72.0 in | Wt 219.0 lb

## 2017-03-20 DIAGNOSIS — R5383 Other fatigue: Secondary | ICD-10-CM

## 2017-03-20 DIAGNOSIS — M791 Myalgia: Secondary | ICD-10-CM

## 2017-03-20 DIAGNOSIS — Z811 Family history of alcohol abuse and dependence: Secondary | ICD-10-CM

## 2017-03-20 DIAGNOSIS — R5381 Other malaise: Secondary | ICD-10-CM | POA: Diagnosis not present

## 2017-03-20 DIAGNOSIS — Z87891 Personal history of nicotine dependence: Secondary | ICD-10-CM

## 2017-03-20 DIAGNOSIS — M549 Dorsalgia, unspecified: Secondary | ICD-10-CM | POA: Diagnosis not present

## 2017-03-20 DIAGNOSIS — G4700 Insomnia, unspecified: Secondary | ICD-10-CM

## 2017-03-20 DIAGNOSIS — F419 Anxiety disorder, unspecified: Secondary | ICD-10-CM | POA: Diagnosis not present

## 2017-03-20 DIAGNOSIS — F3162 Bipolar disorder, current episode mixed, moderate: Secondary | ICD-10-CM

## 2017-03-20 DIAGNOSIS — F4310 Post-traumatic stress disorder, unspecified: Secondary | ICD-10-CM | POA: Diagnosis not present

## 2017-03-20 NOTE — Telephone Encounter (Signed)
Fax Forms received from [facility] with [employer] address [address] [place] [address]. Phone number is [phone] and Fax number is [phone]. There was a total of 11 pages and they wanted provider to complete forms and fax back to them for pt to return to work. Provider completed faxed forms and handed it to RMA to fax to name on Fax. RMA called pt and informed him that form was received and will be faxed to the person on form. Informed pt that a release for Mrs. Mertie Moores has to be completed and pt stated he will come by office on 03-21-2017 to complete. Pt then request a copy of the completed form that provider filled out for his Records. Informed pt that forms will be faxed to where it need to go to when they come in tomorrow and pt agreed. RMA will fax form to [facility] on 03-21-2017 and copy of form will be given to pt and original form will be went to scan center to be scanned to patient chart. Provider signed forms for pt to receive a copy of the forms for his record and for office to release to [facility] with the [employer].

## 2017-03-20 NOTE — Telephone Encounter (Signed)
Pt came into office to see provider. During visit, pt requested that provider wrote him a letter for his employer which has to go to [facility]. Pt filled out release of information for [facility] and request to take the letter with them. A copy of the forms and letter was made and sent to scan center.
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
## 2017-03-21 ENCOUNTER — Telehealth (HOSPITAL_COMMUNITY): Payer: Self-pay | Admitting: *Deleted

## 2017-03-21 NOTE — Telephone Encounter (Signed)
Per previous message on file, pt wanted to get a copy of the form that provider filled out. Pt came into office today and picked up a copy for completed form. RMA sent completed form to scan center to be scanned in pts chart.

## 2017-03-26 ENCOUNTER — Encounter (HOSPITAL_COMMUNITY): Payer: Self-pay | Admitting: Licensed Clinical Social Worker

## 2017-03-26 ENCOUNTER — Ambulatory Visit (INDEPENDENT_AMBULATORY_CARE_PROVIDER_SITE_OTHER): Admitting: Licensed Clinical Social Worker

## 2017-03-26 DIAGNOSIS — F3162 Bipolar disorder, current episode mixed, moderate: Secondary | ICD-10-CM
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
## 2017-03-27 ENCOUNTER — Encounter (HOSPITAL_COMMUNITY): Payer: Self-pay | Admitting: Psychiatry

## 2017-04-03 ENCOUNTER — Ambulatory Visit: Admitting: Gastroenterology

## 2017-04-15 ENCOUNTER — Ambulatory Visit (INDEPENDENT_AMBULATORY_CARE_PROVIDER_SITE_OTHER): Admitting: Psychiatry

## 2017-04-15 ENCOUNTER — Encounter (HOSPITAL_COMMUNITY): Payer: Self-pay | Admitting: Psychiatry

## 2017-04-15 ENCOUNTER — Other Ambulatory Visit: Payer: Self-pay | Admitting: Family Medicine

## 2017-04-15 VITALS — BP 130/70 | HR 65 | Ht 72.0 in | Wt 229.0 lb

## 2017-04-15 DIAGNOSIS — Z87891 Personal history of nicotine dependence: Secondary | ICD-10-CM

## 2017-04-15 DIAGNOSIS — F3162 Bipolar disorder, current episode mixed, moderate: Secondary | ICD-10-CM

## 2017-04-15 DIAGNOSIS — G4700 Insomnia, unspecified: Secondary | ICD-10-CM

## 2017-04-15 DIAGNOSIS — Z79899 Other long term (current) drug therapy: Secondary | ICD-10-CM

## 2017-04-15 MED ORDER — HYDROXYZINE PAMOATE 25 MG PO CAPS: 25.0000 mg | ORAL_CAPSULE | Freq: Every day | ORAL | 2 refills | Status: DC

## 2017-04-15 MED ORDER — MELOXICAM 15 MG PO TABS: 15.0000 mg | ORAL_TABLET | Freq: Every day | ORAL | 0 refills | Status: DC

## 2017-04-15 NOTE — Progress Notes (Signed)
Psychiatric Initial Adult Assessment   Patient Identification: Adam Oneal. Medlen MRN:  [id number] Date of Evaluation:  04/15/2017 Referral Source: [facility] Chief Complaint:   Chief Complaint    Depression; Anxiety; Follow-up     Visit Diagnosis:    ICD-10-CM   1. Bipolar 1 disorder, mixed, moderate (HCC) F31.62     History of Present Illness:  This patient is a 56 year old married white male who lives with his wife in [place]. 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 He moved here 10 months ago from [place]. The couple have no children   The patient was referred by his primary care provider from [place] [facility] family medicine for further assessment and treatment of bipolar disorder.  The patient is somewhat of a vague historian and isn't quite sure how he got diagnosed with bipolar disorder. He states that he has had "mood swings" all his life. He went into the [employer] at 56 years of age and got out 71 years later. He states that his mood was up and down throughout the whole time but he dealt with that by drinking quite a bit of alcohol as well as caffeine during the day. 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 His moods are "up and down" he states that at times he does have symptoms of overt mania such as inability to sleep and hypersexuality.  In the fall and winter he becomes more depressed and tired with low energy and motivation. He has been on lithium for a number of years in his current level is 0.6. Since he moved here he has had to work the night shift for the first time in his life. He has difficulty sleeping during the day and also doesn't remember to take off his medications. He states that he tends to obsess and wants his house spotlessly clean. At times he rants and raves about situations at work but would never really hurt anybody or himself. He's never had  psychiatric inpatient treatment. His wife describes him as "very anxious" and unhappy with his job. He calls in sick about once a month. He has no friends and doesn't really trust other people. 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 He has been back at work for about a week and is handling it well. He's not had any more symptoms of being out of touch with reality or confused. He attributes all of this to taking clonazepam. He still somewhat anxious at work and doesn't like being outside his home very much. 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 He still having flashbacks and bad memories about seeing a man get killed in a training exercise. He is just recently starting therapy with [facility] here but never had therapy in the [employer]. His mood is fairly good and he still is getting used to the hydroxyzine. He sometimes doesn't sleep very well but he wants to stay on it and "try to get used to it." He denies any manic or depressive symptoms  Associated Signs/Symptoms: Depression Symptoms:  depressed mood, anhedonia, insomnia, fatigue, (Hypo) Manic Symptoms:  Irritable Mood, Labiality of Mood, Anxiety Symptoms:  Excessive Worry,  Obsessive Compulsive Symptoms:   Wants everything in the house in it's place, Psychotic Symptoms:  PTSD Symptoms: Had a traumatic exposure:  Was unable ship and was worried about exposure to chemical warfare and mines near [place] Re-experiencing:  Intrusive Thoughts  Past Psychiatric History: Has seen psychiatrists in the past and had 2 years of therapy  Previous Psychotropic Medications: Yes   Substance Abuse History in the last 12 months:  No.  Consequences of Substance Abuse: NA  Past Medical History:  Past Medical History:  Diagnosis Date  . Acid reflux   . Anxiety  01/1998  . Cataract 02/2004  . Depression 01/1998  . Diabetes mellitus without complication ([place]) [id number]  . Diabetes mellitus, type II ([place])   . Hyperlipidemia 11/2006  . Hypertension   . Hypertension    No past surgical history on file.  Family Psychiatric History: He describes his father as "very OCD" he denies any other mental illness in the family  Family History:  Family History  Problem Relation Age of Onset  . Cancer Mother 77       lung  . Heart disease Father   . Hyperlipidemia Father   . Diabetes Brother   . Leukemia Brother   . Diabetes Paternal Grandmother   . Heart disease Paternal Grandmother   . Alcohol abuse Paternal Grandfather   . Diabetes Brother   . Depression Maternal Aunt     Social History:   Social History   Social History  . Marital status: Married    Spouse name: N/A  . Number of children: N/A  . Years of education: N/A   Social History Main Topics  . Smoking status: Former Smoker    Packs/day: 1.00    Years: 12.00    Types: Cigarettes    Start date: 01/05/1978    Quit date: 12/28/1989  . Smokeless tobacco: Former Systems developer  . Alcohol use No     Comment: 03-26-16 per pt no  . Drug use: No     Comment: 03-26-16 per pt currently no but use to  . Sexual activity: No   Other Topics Concern  . None   Social History Narrative  . None    Additional Social History: Patient grew up with both parents 2 brothers and one sister. His father was a Ecologist and the family moved a lot around the [place]. At age 24 the patient joined the [employer] and stayed in for 20 years. He has been with postal service for 18 years. He is married but has no children  Allergies:   Allergies  Allergen Reactions  . Lipitor [Atorvastatin] Shortness Of Breath    Metabolic Disorder Labs: Lab Results  Component Value Date   HGBA1C 6.1 09/06/2015   No results found for: PROLACTIN Lab Results  Component Value Date   CHOL 146 01/18/2016   TRIG 96 01/18/2016    HDL 55 01/18/2016   CHOLHDL 2.7 01/18/2016   LDLCALC 72 01/18/2016   LDLCALC 60 04/14/2015     Current Medications: Current Outpatient Prescriptions  Medication Sig Dispense Refill  . cetirizine (ZYRTEC) 10 MG tablet TAKE 1 TABLET DAILY 90 tablet 1  . cyclobenzaprine (FLEXERIL) 10 MG tablet Take 1 tablet (10 mg total) by mouth 3 (three) times daily as needed for muscle spasms. 270 tablet 2  . DULoxetine (CYMBALTA) 60 MG capsule Take 1 capsule (60 mg total) by mouth daily. 90 capsule 2  . fluticasone (FLONASE) 50 MCG/ACT nasal spray Place 2 sprays  into both nostrils daily. (Patient taking differently: Place 2 sprays into both nostrils as needed. ) 16 g 11  . hydrOXYzine (VISTARIL) 25 MG capsule Take 1 capsule (25 mg total) by mouth at bedtime. 90 capsule 2  . lisinopril (PRINIVIL,ZESTRIL) 10 MG tablet Take 1 tablet (10 mg total) by mouth daily. 90 tablet 1  . lithium carbonate (ESKALITH) 450 MG CR tablet Take 2 tablets (900 mg total) by mouth at bedtime. 180 tablet 2  . meloxicam (MOBIC) 15 MG tablet Take 1 tablet (15 mg total) by mouth daily. 90 tablet 0  . mometasone (NASONEX) 50 MCG/ACT nasal spray Place 2 sprays into the nose daily.    . ondansetron (ZOFRAN) 4 MG tablet Take 1 tablet (4 mg total) by mouth every 8 (eight) hours as needed for nausea or vomiting. 20 tablet 0   No current facility-administered medications for this visit.     Neurologic: Headache: No Seizure: No Paresthesias:No  Musculoskeletal: Strength & Muscle Tone: within normal limits Gait & Station: normal Patient leans: N/A  Psychiatric Specialty Exam: Review of Systems  Constitutional: Positive for malaise/fatigue.  Musculoskeletal: Positive for back pain and myalgias.  Psychiatric/Behavioral: Positive for depression. The patient is nervous/anxious and has insomnia.     Blood pressure 130/70, pulse 65, height 6' (1.829 m), weight 229 lb (103.9 kg).Body mass index is 31.06 kg/m.  General Appearance:  Casual and Fairly Groomed  Eye Contact:  Good  Speech:  Clear and Coherent  Volume:  Normal  Mood: Good,  Affect:  Bright and appropriate   Thought Process:  Goal Directed  Orientation:  Full (Time, Place, and Person)  Thought Content:  Obsessions and Rumination  Suicidal Thoughts:  No    Homicidal Thoughts:  No  Memory:  Immediate;   Good  Judgement:  Fair  Insight:  Fair  Psychomotor Activity:  Normal  Concentration:  Concentration: Good and Attention Span: Good  Recall:  [place] of Knowledge:Good  Language: Good  Akathisia:  No  Handed: Right   AIMS (if indicated):    Assets:  Communication Skills Desire for Improvement Physical Health Resilience Social Support Talents/Skills  ADL's:  Intact  Cognition: Intact   Sleep:  Up and down     Treatment Plan Summary: Medication management   The patient will continue lithium CR 900 mg at bedtime.And Cymbalta 60 mg daily both for mood stabilization and depressionLast lithium level was okay at 0.6. He will Continue hydroxyzine 25 mg at bedtime. He will Continue counseling here and he will also return to see me in 4 weeks.   Levonne Spiller, MD 9/17/20189:15 AMPatient ID: Rose Fillers. Arnott, male   DOB: 11-04-1960, 56 y.o.   MRN: [id number]

## 2017-04-15 NOTE — Telephone Encounter (Signed)
What is the name of the medication? meloxicam (MOBIC) 15 MG tablet  Have you contacted your pharmacy to request a refill[facility]  [facility]   Patient notified that their request is being sent to the clinical staff for review and that they should receive a call once it is complete. If they do not receive a call within 24 hours they can check with their pharmacy or our office.

## 2017-04-17 ENCOUNTER — Ambulatory Visit (INDEPENDENT_AMBULATORY_CARE_PROVIDER_SITE_OTHER): Admitting: Licensed Clinical Social Worker

## 2017-04-17 DIAGNOSIS — F3162 Bipolar disorder, current episode mixed, moderate: Secondary | ICD-10-CM

## 2017-04-17 NOTE — Progress Notes (Signed)
   THERAPIST PROGRESS NOTE  Session Time: 11:00 am -11:45 am  Participation Level: Active  Behavioral Response: CasualAlertAnxious  Type of Therapy: Individual Therapy  Treatment Goals addressed: Coping  Interventions: CBT and Solution Focused  Summary: Adam Oneal. Osborn is a 56 y.o. male who  presents oriented x5 (person, place, situation, time, and object), alert, average weight, average height, casually dressed, euthymic and cooperative to address mood and anxiety. Patient has a history of medical treatment including hypertension, degenerative disc disease and type 2 diabetes. Patient has a history of mental health treatment including medication management. Patient denies suicidal and homicidal ideations. Patient denies psychosis including auditory and visual hallucinations. Patient denies substance abuse. He is at low risk for lethality at this time.  Patient had an average score of 6.25 out of 10 on the Outcome Rating Scale. Patient reports that he is back at work and doing well but still experiences some anxiety. Patient noted that he is experiencing anxiety about being outside of the home due to society being "mean" and his training in the [employer] taught him to avoid places with lots of people. Patient jumped from subject to subject several times but each subject was related to a source of anxiety (being outside of the home, his wife gambling, stress at work, relationship with daughter, etc). Patient noted that watching tv and taking his medication helps him manage his anxiety. Patient committed to take medication as prescribed and manage his anxiety appropriately. Patient rated the session 10 out of 10 on the Session Rating Scale.  Patient engaged in session. He responded well to interventions. Patient continues to meet criteria for Bipolar 1 disorder, mixed, moderate. Patient will continue in outpatient therapy due to being the least restrictive service to meet his needs at this time.  Patient made minimal progress on his goals.   Suicidal/Homicidal: Negativewithout intent/plan  Therapist Response: Therapist reviewed patient's recent thoughts and behaviors. Therapist utilized CBT to address mood and anxiety. Therapist processed patient's feelings to identify triggers for anxiety. Therapist discussed with patient how he manages his anxiety. Therapist committed patient to take medication and manage his anxiety appropriately. Therapist administered the Outcome Rating Scale and the Session Rating Scale.   Plan: Return again in 2-3 weeks. Therapist will review patient goals on or before 11.28.2018  Diagnosis: Axis I: Bipolar, mixed    Axis II: No diagnosis    Bynum Bellows, LCSW 04/17/2017

## 2017-05-08 ENCOUNTER — Ambulatory Visit (INDEPENDENT_AMBULATORY_CARE_PROVIDER_SITE_OTHER): Admitting: Licensed Clinical Social Worker

## 2017-05-08 ENCOUNTER — Ambulatory Visit (HOSPITAL_COMMUNITY): Admitting: Psychiatry

## 2017-05-08 DIAGNOSIS — F3162 Bipolar disorder, current episode mixed, moderate: Secondary | ICD-10-CM

## 2017-05-08 NOTE — Progress Notes (Signed)
   THERAPIST PROGRESS NOTE  Session Time: 10:00 am -10:45 am  Participation Level: Active  Behavioral Response: CasualAlertAnxious  Type of Therapy: Individual Therapy  Treatment Goals addressed: Coping  Interventions: CBT and Solution Focused  Summary: Adam Oneal is a 56 y.o. male who  presents oriented x5 (person, place, situation, time, and object), alert, average weight, average height, casually dressed, euthymic and cooperative to address mood and anxiety. Patient has a history of medical treatment including hypertension, degenerative disc disease and type 2 diabetes. Patient has a history of mental health treatment including medication management. Patient denies suicidal and homicidal ideations. Patient denies psychosis including auditory and visual hallucinations. Patient denies substance abuse. He is at low risk for lethality at this time.  Patient had an average score of 6.75 out of 10 on the Outcome Rating Scale which is an increase of .50. Patient reports that he is managing anxiety and denied manic symptoms. He is doing well at work and things are going well at home. Patient reports that his medication is helpful but he has experienced an increase in dreams related to his service in the [employer] that are stressful but he is able to go back to sleep. Patient committed to continue to manage his mood and anxiety appropriately. Patient rated the session 10 out of 10 on the Session Rating Scale.   Patient engaged in session. He responded well to interventions. Patient continues to meet criteria for Bipolar 1 disorder, mixed, moderate. Patient will continue in outpatient therapy due to being the least restrictive service to meet his needs at this time. Patient made moderate progress on his goals.   Suicidal/Homicidal: Negativewithout intent/plan  Therapist Response: Therapist reviewed patient's recent thoughts and behaviors. Therapist utilized CBT to address mood and anxiety. Therapist  processed patient's feelings to identify triggers for mood. Therapist discussed how patient has continued to manage his mood. Therapist committed patient to manage his mood and anxiety appropriately with medication and coping skills.  Therapist administered the Outcome Rating Scale and the Session Rating Scale.   Plan: Return again in 2-3 weeks. Therapist will review patient goals on or before 11.28.2018  Diagnosis: Axis I: Bipolar, mixed    Axis II: No diagnosis    Bynum Bellows, LCSW 05/08/2017

## 2017-05-15 ENCOUNTER — Ambulatory Visit (HOSPITAL_COMMUNITY): Admitting: Psychiatry

## 2017-06-05 ENCOUNTER — Ambulatory Visit (INDEPENDENT_AMBULATORY_CARE_PROVIDER_SITE_OTHER): Admitting: Licensed Clinical Social Worker

## 2017-06-05 ENCOUNTER — Encounter (HOSPITAL_COMMUNITY): Payer: Self-pay | Admitting: Licensed Clinical Social Worker

## 2017-06-05 DIAGNOSIS — F3162 Bipolar disorder, current episode mixed, moderate: Secondary | ICD-10-CM | POA: Diagnosis not present

## 2017-06-05 NOTE — Progress Notes (Signed)
   THERAPIST PROGRESS NOTE  Session Time: 10:00 am -10:45 am  Participation Level: Active  Behavioral Response: CasualAlertAnxious  Type of Therapy: Individual Therapy  Treatment Goals addressed: Coping  Interventions: CBT and Solution Focused  Summary: Adam PouRobert S. Doroteo Glassmanhelps is a 56 y.o. male who  presents oriented x5 (person, place, situation, time, and object), alert, average weight, average height, casually dressed, euthymic and cooperative to address mood and anxiety. Patient has a history of medical treatment including hypertension, degenerative disc disease and type 2 diabetes. Patient has a history of mental health treatment including medication management. Patient denies suicidal and homicidal ideations. Patient denies psychosis including auditory and visual hallucinations. Patient denies substance abuse. He is at low risk for lethality at this time.  Patient had an average score of 7.75 out of 10 on the Outcome Rating Scale which is an increase of .1.  Patient reports that things are going well at work. He is satisfied with his job. He notes that his wife continues to struggle with gambling which impacts their finances. He is considering taking over the bills. Patient share that he occasionally struggles with eating when he shouldn't or when he isn't hungry. Patient said that food has been an issue for his family. Patient also reported improved sleep. He is sleeping harder and longer than before due to working hard at work. Patient committed to continue to manage his mood and look for triggers for food. Patient rated the session 8 out of 10 on the Session Rating Scale.   Patient engaged in session. He responded well to interventions. Patient continues to meet criteria for Bipolar 1 disorder, mixed, moderate. Patient will continue in outpatient therapy due to being the least restrictive service to meet his needs at this time. Patient made moderate progress on his goals.   Suicidal/Homicidal:  Negativewithout intent/plan  Therapist Response: Therapist reviewed patient's recent thoughts and behaviors. Therapist utilized CBT to address mood and anxiety. Therapist processed patient's feelings to identify triggers for mood. Therapist assisted patient in identifying ways to manage his mood and desire to eat. Therapist committed patient to manage his mood and look for triggers for food. Therapist administered the Outcome Rating Scale and the Session Rating Scale.   Plan: Return again in 4 weeks. Therapist will review patient goals on or before 11.28.2018  Diagnosis: Axis I: Bipolar, mixed    Axis II: No diagnosis    Adam BellowsJoshua Alonnah Lampkins, LCSW 06/05/2017

## 2017-06-10 ENCOUNTER — Telehealth: Payer: Self-pay | Admitting: Family Medicine

## 2017-06-10 DIAGNOSIS — I10 Essential (primary) hypertension: Secondary | ICD-10-CM

## 2017-06-10 MED ORDER — LISINOPRIL 10 MG PO TABS: 10.0000 mg | ORAL_TABLET | Freq: Every day | ORAL | 0 refills | Status: DC

## 2017-06-10 NOTE — Telephone Encounter (Signed)
Pt aware refill sent to [facility] appt made for 07/02/17

## 2017-06-12 ENCOUNTER — Ambulatory Visit (INDEPENDENT_AMBULATORY_CARE_PROVIDER_SITE_OTHER): Admitting: Psychiatry

## 2017-06-12 ENCOUNTER — Encounter (HOSPITAL_COMMUNITY): Payer: Self-pay | Admitting: Psychiatry

## 2017-06-12 VITALS — BP 140/88 | HR 54 | Ht 72.0 in | Wt 235.0 lb

## 2017-06-12 DIAGNOSIS — F3110 Bipolar disorder, current episode manic without psychotic features, unspecified: Secondary | ICD-10-CM | POA: Diagnosis not present

## 2017-06-12 DIAGNOSIS — Z811 Family history of alcohol abuse and dependence: Secondary | ICD-10-CM

## 2017-06-12 DIAGNOSIS — Z87891 Personal history of nicotine dependence: Secondary | ICD-10-CM

## 2017-06-12 DIAGNOSIS — Z818 Family history of other mental and behavioral disorders: Secondary | ICD-10-CM

## 2017-06-12 DIAGNOSIS — R5383 Other fatigue: Secondary | ICD-10-CM | POA: Diagnosis not present

## 2017-06-12 DIAGNOSIS — F3162 Bipolar disorder, current episode mixed, moderate: Secondary | ICD-10-CM

## 2017-06-12 DIAGNOSIS — R5381 Other malaise: Secondary | ICD-10-CM | POA: Diagnosis not present

## 2017-06-12 MED ORDER — LITHIUM CARBONATE ER 450 MG PO TBCR
900.0000 mg | EXTENDED_RELEASE_TABLET | Freq: Every day | ORAL | 2 refills | Status: DC
Start: 2017-06-12 — End: 2017-10-15

## 2017-06-12 MED ORDER — DULOXETINE HCL 60 MG PO CPEP: 60.0000 mg | ORAL_CAPSULE | Freq: Every day | ORAL | 2 refills | Status: DC

## 2017-06-12 MED ORDER — HYDROXYZINE PAMOATE 25 MG PO CAPS: 25.0000 mg | ORAL_CAPSULE | Freq: Every day | ORAL | 2 refills | Status: DC

## 2017-06-12 NOTE — Progress Notes (Signed)
BH MD/PA/NP OP Progress Note  06/12/2017 9:40 AM Rose Fillers. Kelnhofer  MRN:  [id number]  Chief Complaint:  Chief Complaint    Depression; Manic Behavior; Anxiety; Follow-up     ZDG:LOVF patient is a 56 year old married white male who lives with his wife in [place]. 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 He moved here 10 months ago from [place]. The couple have no children   The patient was referred by his primary care provider from [place] [facility] family [facility] for further assessment and treatment of bipolar disorder.  The patient is somewhat of a vague historian and isn't quite sure how he got diagnosed with bipolar disorder. He states that he has had "mood swings" all his life. He went into the [employer] at 56 years of age and got out 22 years later. He states that his mood was up and down throughout the whole time but he dealt with that by drinking quite a bit of alcohol as well as caffeine during the day. 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 His moods are "up and down" he states that at times he does have symptoms of overt mania such as inability to sleep and hypersexuality.  In the fall and winter he becomes more depressed and tired with low energy and motivation. He has been on lithium for a number of years in his current level is 0.6. Since he moved here he has had to work the night shift for the first time in his life. He has difficulty sleeping during the day and also doesn't remember to take off his medications. He states that he tends to obsess and wants his house spotlessly clean. At times he rants and raves about situations at work but would never really hurt anybody or himself. He's never had psychiatric inpatient treatment. His wife describes him as "very anxious" and unhappy with his job. He calls in sick about once a month. He has no friends and doesn't really trust other people. 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  He states for the most part he is doing okay.  He is no longer getting agitated at work.  He realizes he has to keep the job because he cannot afford to retire right now.  He states that this time of year he tends to get more subdued and quiet but denies being depressed or suicidal.  He is taking his lithium as prescribed and we will check a level today.  Denies any manic symptoms racing thoughts or difficulty sleeping.  In fact right now he claims he is sleeping too much.  He is very concerned about his wife's gambling.  Apparently she has lost a lot of money over the years and right now they are behind on some payments.  She insists on managing her money.  He is not sure what to do about this but he is seeing a counselor here to get help. Visit Diagnosis:    ICD-10-CM   1. Bipolar I disorder, most recent episode (or current) manic ([place]) F31.10 Lithium level  2. Bipolar 1 disorder, mixed, moderate ([facility]) F31.62     Past Psychiatric History: Long-term outpatient treatment for bipolar disorder  Past Medical History:  Past Medical History:  Diagnosis Date  . Acid reflux   .  Anxiety 01/1998  . Cataract 02/2004  . Depression 01/1998  . Diabetes mellitus without complication ([facility]) 78/5885  . Diabetes mellitus, type II ([place])   . Hyperlipidemia 11/2006  . Hypertension   . Hypertension    History reviewed. No pertinent surgical history.  Family Psychiatric History: One aunt with depression  Family History:  Family History  Problem Relation Age of Onset  . Cancer Mother 17       lung  . Heart disease Father   . Hyperlipidemia Father   . Diabetes Brother   . Leukemia Brother   . Diabetes Paternal Grandmother   . Heart disease Paternal Grandmother   . Alcohol abuse Paternal Grandfather   . Diabetes Brother   . Depression Maternal Aunt     Social History:   Social History   Socioeconomic History  . Marital status: Married    Spouse name: None  . Number of children: None  . Years of education: None  . Highest education level: None  Social Needs  . Financial resource strain: None  . Food insecurity - worry: None  . Food insecurity - inability: None  . Transportation needs - medical: None  . Transportation needs - non-medical: None  Occupational History  . None  Tobacco Use  . Smoking status: Former Smoker    Packs/day: 1.00    Years: 12.00    Pack years: 12.00    Types: Cigarettes    Start date: 01/05/1978    Last attempt to quit: 12/28/1989    Years since quitting: 27.4  . Smokeless tobacco: Former Network engineer and Sexual Activity  . Alcohol use: No    Alcohol/week: 0.0 oz    Comment: 03-26-16 per pt no  . Drug use: No    Comment: 03-26-16 per pt currently no but use to  . Sexual activity: No  Other Topics Concern  . None  Social History Narrative  . None    Allergies:  Allergies  Allergen Reactions  . Lipitor [Atorvastatin] Shortness Of Breath    Metabolic Disorder Labs: Lab Results  Component Value Date   HGBA1C 6.1 09/06/2015   No results found for: PROLACTIN Lab Results  Component Value Date   CHOL 146 01/18/2016   TRIG 96 01/18/2016   HDL 55 01/18/2016   CHOLHDL 2.7 01/18/2016   LDLCALC 72 01/18/2016   LDLCALC 60 04/14/2015   Lab Results  Component Value Date   TSH 2.270 12/03/2016   TSH 1.110 01/18/2016    Therapeutic Level Labs: Lab Results  Component Value Date   LITHIUM 0.5 (L) 11/06/2016   LITHIUM 0.7 03/20/2016   No results found for: VALPROATE No components found for:  CBMZ  Current Medications: Current Outpatient Medications  Medication Sig Dispense Refill  . cetirizine (ZYRTEC) 10 MG tablet TAKE 1 TABLET DAILY 90 tablet 1  . cyclobenzaprine (FLEXERIL) 10 MG tablet Take 1 tablet (10 mg total) by mouth 3 (three) times daily as needed for muscle spasms. 270 tablet 2  . DULoxetine  (CYMBALTA) 60 MG capsule Take 1 capsule (60 mg total) daily by mouth. 90 capsule 2  . fluticasone (FLONASE) 50 MCG/ACT nasal spray Place 2 sprays into both nostrils daily. (Patient taking differently: Place 2 sprays into both nostrils as needed. ) 16 g 11  . hydrOXYzine (VISTARIL) 25 MG capsule Take 1 capsule (25 mg total) at bedtime by mouth. 90 capsule 2  . lisinopril (PRINIVIL,ZESTRIL) 10 MG tablet Take 1 tablet (10 mg total) daily by mouth.  30 tablet 0  . lithium carbonate (ESKALITH) 450 MG CR tablet Take 2 tablets (900 mg total) at bedtime by mouth. 180 tablet 2  . meloxicam (MOBIC) 15 MG tablet Take 1 tablet (15 mg total) by mouth daily. 90 tablet 0  . mometasone (NASONEX) 50 MCG/ACT nasal spray Place 2 sprays into the nose daily.    . ondansetron (ZOFRAN) 4 MG tablet Take 1 tablet (4 mg total) by mouth every 8 (eight) hours as needed for nausea or vomiting. 20 tablet 0   No current facility-administered medications for this visit.      Musculoskeletal: Strength & Muscle Tone: within normal limits Gait & Station: normal Patient leans: N/A  Psychiatric Specialty Exam: Review of Systems  Constitutional: Positive for malaise/fatigue.  All other systems reviewed and are negative.   Blood pressure 140/88, pulse (!) 54, height 6' (1.829 m), weight 235 lb (106.6 kg), SpO2 96 %.Body mass index is 31.87 kg/m.  General Appearance: Casual  Eye Contact:  Good  Speech:  Clear and Coherent  Volume:  Normal  Mood:  Anxious  Affect:  Congruent  Thought Process:  Goal Directed  Orientation:  Full (Time, Place, and Person)  Thought Content: Rumination   Suicidal Thoughts:  No  Homicidal Thoughts:  No  Memory:  Immediate;   Good Recent;   Good Remote;   Good  Judgement:  Fair  Insight:  Fair  Psychomotor Activity:  Normal  Concentration:  Concentration: Good and Attention Span: Good  Recall:  Good  Fund of Knowledge: Good  Language: Good  Akathisia:  No  Handed:  Right  AIMS (if  indicated): not done  Assets:  Communication Skills Desire for Improvement Physical Health Resilience Social Support Talents/Skills  ADL's:  Intact  Cognition: WNL  Sleep:  Good   Screenings: PHQ2-9     Office Visit from 01/31/2017 in [facility] Office Visit from 01/14/2017 in [facility] Office Visit from 12/03/2016 in [facility] Office Visit from 11/30/2016 in [facility] Office Visit from 10/10/2016 in [facility]  PHQ-2 Total Score  0  0  0  0  0       Assessment and Plan: She is a 56 year old male with a history of bipolar disorder.  He was most recently manic last summer.  His last lithium level was 0.5.  He will continue to carbonate 900 mg at bedtime and he will check a level this morning.  He will continue Cymbalta 60 mg daily for depression and hydroxyzine 25 mg daily for sleep.  He will continue his counseling and return to see me in 2 months.   Levonne Spiller, MD 06/12/2017, 9:40 AM

## 2017-06-13 LAB — LITHIUM LEVEL: Lithium Lvl: 0.6 mmol/L (ref 0.6–1.2)

## 2017-07-02 ENCOUNTER — Ambulatory Visit (INDEPENDENT_AMBULATORY_CARE_PROVIDER_SITE_OTHER): Admitting: Family Medicine

## 2017-07-02 ENCOUNTER — Encounter: Payer: Self-pay | Admitting: Family Medicine

## 2017-07-02 VITALS — BP 120/78 | HR 57 | Temp 97.7°F | Ht 72.0 in | Wt 227.0 lb

## 2017-07-02 DIAGNOSIS — I152 Hypertension secondary to endocrine disorders: Secondary | ICD-10-CM

## 2017-07-02 DIAGNOSIS — E1159 Type 2 diabetes mellitus with other circulatory complications: Secondary | ICD-10-CM | POA: Diagnosis not present

## 2017-07-02 DIAGNOSIS — E785 Hyperlipidemia, unspecified: Secondary | ICD-10-CM

## 2017-07-02 DIAGNOSIS — E119 Type 2 diabetes mellitus without complications: Secondary | ICD-10-CM | POA: Diagnosis not present

## 2017-07-02 DIAGNOSIS — I10 Essential (primary) hypertension: Secondary | ICD-10-CM | POA: Diagnosis not present

## 2017-07-02 DIAGNOSIS — Z114 Encounter for screening for human immunodeficiency virus [HIV]: Secondary | ICD-10-CM

## 2017-07-02 DIAGNOSIS — Z1159 Encounter for screening for other viral diseases: Secondary | ICD-10-CM | POA: Diagnosis not present

## 2017-07-02 DIAGNOSIS — E1169 Type 2 diabetes mellitus with other specified complication: Secondary | ICD-10-CM

## 2017-07-02 LAB — BAYER DCA HB A1C WAIVED: HB A1C: 6 % (ref <7.0)

## 2017-07-02 NOTE — Progress Notes (Signed)
BP 120/78   Pulse (!) 57   Temp 97.7 F (36.5 C) (Oral)   Ht 6' (1.829 m)   Wt 227 lb (103 kg)   BMI 30.79 kg/m    Subjective:    Patient ID: Adam Oneal, male    DOB: [birth year], 58 y.o.   MRN: [id number]  HPI: Adam Oneal is a 56 y.o. male presenting on 07/02/2017 for Diabetes (3 mo); Hyperlipidemia; and Hypertension   HPI Hyperlipidemia Patient is coming in for recheck of his hyperlipidemia. The patient is currently taking no medication and diet controlled. They deny any issues with myalgias or history of liver damage from it. They deny any focal numbness or weakness or chest pain.   Hypertension Patient is currently on lisinopril, and their blood pressure today is 120/70. Patient denies any lightheadedness or dizziness. Patient denies headaches, blurred vision, chest pains, shortness of breath, or weakness. Denies any side effects from medication and is content with current medication.   Type 2 diabetes mellitus Patient comes in today for recheck of his diabetes. Patient has been currently taking diet control. Patient is currently on an ACE inhibitor/ARB. Patient has not seen an ophthalmologist this year. Patient denies any issues with their feet.   Relevant past medical, surgical, family and social history reviewed and updated as indicated. Interim medical history since our last visit reviewed. Allergies and medications reviewed and updated.  Review of Systems  Constitutional: Negative for chills and fever.  HENT: Negative for congestion.   Eyes: Negative for discharge.  Respiratory: Negative for shortness of breath and wheezing.   Cardiovascular: Negative for chest pain and leg swelling.  Musculoskeletal: Negative for back pain and gait problem.  Skin: Negative for rash.  Neurological: Negative for dizziness, weakness, light-headedness and numbness.  All other systems reviewed and are negative.   Per HPI unless specifically indicated above     Objective:      BP 120/78   Pulse (!) 57   Temp 97.7 F (36.5 C) (Oral)   Ht 6' (1.829 m)   Wt 227 lb (103 kg)   BMI 30.79 kg/m   Wt Readings from Last 3 Encounters:  07/02/17 227 lb (103 kg)  01/14/17 235 lb (106.6 kg)  12/03/16 239 lb 3.2 oz (108.5 kg)    Physical Exam  Constitutional: He is oriented to person, place, and time. He appears well-developed and well-nourished. No distress.  Eyes: Conjunctivae are normal. No scleral icterus.  Neck: Neck supple. No thyromegaly present.  Cardiovascular: Normal rate, regular rhythm, normal heart sounds and intact distal pulses.  No murmur heard. Pulmonary/Chest: Effort normal and breath sounds normal. No respiratory distress. He has no wheezes. He has no rales.  Musculoskeletal: Normal range of motion.  Lymphadenopathy:    He has no cervical adenopathy.  Neurological: He is alert and oriented to person, place, and time. Coordination normal.  Skin: Skin is warm and dry. No rash noted. He is not diaphoretic.  Psychiatric: He has a normal mood and affect. His behavior is normal.  Nursing note and vitals reviewed.     Assessment & Plan:   Problem List Items Addressed This Visit      Cardiovascular and Mediastinum   Hypertension associated with diabetes ([place])   Relevant Orders   CMP14+EGFR     Endocrine   Type 2 diabetes mellitus without complication, without long-term current use of insulin ([facility]) - Primary   Relevant Orders   Bayer DCA Hb A1c Waived  CMP14+EGFR   Hyperlipidemia associated with type 2 diabetes mellitus ([place])   Relevant Orders   Lipid panel    Other Visit Diagnoses    Need for hepatitis C screening test       Relevant Orders   Hepatitis C antibody   Encounter for screening for HIV       Relevant Orders   HIV antibody      Follow up plan: Return in about 6 months (around 12/31/2017), or if symptoms worsen or fail to improve, for Diabetes and hypertension and cholesterol.  Counseling provided for all of the vaccine  components Orders Placed This Encounter  Procedures  . Bayer DCA Hb A1c Waived  . CMP14+EGFR  . Lipid panel  . Hepatitis C antibody  . HIV antibody    Caryl Pina, MD [facility] 07/02/2017, 10:54 AM

## 2017-07-03 LAB — CMP14+EGFR
ALBUMIN: 4.3 g/dL (ref 3.5–5.5)
ALT: 17 IU/L (ref 0–44)
AST: 16 IU/L (ref 0–40)
Albumin/Globulin Ratio: 1.9 (ref 1.2–2.2)
Alkaline Phosphatase: 108 IU/L (ref 39–117)
BUN/Creatinine Ratio: 25 — ABNORMAL HIGH (ref 9–20)
BUN: 20 mg/dL (ref 6–24)
Bilirubin Total: 0.3 mg/dL (ref 0.0–1.2)
CO2: 23 mmol/L (ref 20–29)
CREATININE: 0.8 mg/dL (ref 0.76–1.27)
Calcium: 9.4 mg/dL (ref 8.7–10.2)
Chloride: 108 mmol/L — ABNORMAL HIGH (ref 96–106)
GFR, EST AFRICAN AMERICAN: 114 mL/min/{1.73_m2} (ref >59)
GFR, EST NON AFRICAN AMERICAN: 98 mL/min/{1.73_m2} (ref >59)
GLUCOSE: 154 mg/dL — AB (ref 65–99)
Globulin, Total: 2.3 g/dL (ref 1.5–4.5)
Potassium: 4.8 mmol/L (ref 3.5–5.2)
Sodium: 143 mmol/L (ref 134–144)
Total Protein: 6.6 g/dL (ref 6.0–8.5)

## 2017-07-03 LAB — LIPID PANEL
CHOL/HDL RATIO: 2.7 ratio (ref 0.0–5.0)
Cholesterol, Total: 144 mg/dL (ref 100–199)
HDL: 54 mg/dL (ref >39)
LDL CALC: 70 mg/dL (ref 0–99)
TRIGLYCERIDES: 99 mg/dL (ref 0–149)
VLDL CHOLESTEROL CAL: 20 mg/dL (ref 5–40)

## 2017-07-03 LAB — HIV ANTIBODY (ROUTINE TESTING W REFLEX): HIV Screen 4th Generation wRfx: NONREACTIVE

## 2017-07-03 LAB — HEPATITIS C ANTIBODY

## 2017-07-09 ENCOUNTER — Ambulatory Visit (HOSPITAL_COMMUNITY): Admitting: Licensed Clinical Social Worker

## 2017-07-19 ENCOUNTER — Other Ambulatory Visit: Payer: Self-pay | Admitting: Family Medicine

## 2017-07-19 DIAGNOSIS — I10 Essential (primary) hypertension: Secondary | ICD-10-CM

## 2017-07-24 ENCOUNTER — Other Ambulatory Visit: Payer: Self-pay | Admitting: Family Medicine

## 2017-07-24 MED ORDER — LISINOPRIL 10 MG PO TABS: 10.0000 mg | ORAL_TABLET | Freq: Every day | ORAL | 0 refills | Status: DC

## 2017-07-24 NOTE — Telephone Encounter (Signed)
Wife aware and verbalizes understanding per dpr. Sending to PCP- please advise and send to pools- aware you will not return until Friday.

## 2017-07-24 NOTE — Telephone Encounter (Signed)
Recommend that patient follow up with Dr Dettinger re: continued Mobic use.  Mobic and Lithium interact.  Mobic should likely not be used long term.

## 2017-07-24 NOTE — Progress Notes (Signed)
Recommend that patient follow up with Dr Dettinger re: continued Mobic use.  Mobic and Lithium interact, causing increased concentrations of Lithium.  Mobic should likely not be used long term.  Adam Oneal M. [name], DO [facility]

## 2017-07-24 NOTE — Telephone Encounter (Signed)
Lisinopril sent - Please advise on Meloxicam- covering PCP

## 2017-07-24 NOTE — Telephone Encounter (Signed)
Pt is out of meds wants this done today. Please advise.

## 2017-07-26 MED ORDER — MELOXICAM 15 MG PO TABS: 15.0000 mg | ORAL_TABLET | Freq: Every day | ORAL | 0 refills | Status: DC

## 2017-07-26 NOTE — Progress Notes (Signed)
Have him continue on the meloxicam for now and I will do some research into if there are any better options that we could get him onto in the next month.  Try not to take it every day

## 2017-07-26 NOTE — Telephone Encounter (Signed)
Go ahead and call in a [facility] month [facility]

## 2017-07-26 NOTE — Telephone Encounter (Signed)
Rx sent-wife aware.  

## 2017-07-26 NOTE — Progress Notes (Signed)
Wife aware- rx sent.

## 2017-08-07 ENCOUNTER — Ambulatory Visit (HOSPITAL_COMMUNITY): Admitting: Licensed Clinical Social Worker

## 2017-08-13 ENCOUNTER — Ambulatory Visit (HOSPITAL_COMMUNITY): Admitting: Psychiatry

## 2017-09-02 ENCOUNTER — Other Ambulatory Visit: Payer: Self-pay | Admitting: Family Medicine

## 2017-09-02 MED ORDER — MELOXICAM 15 MG PO TABS: 15.0000 mg | ORAL_TABLET | Freq: Every day | ORAL | 0 refills | Status: DC

## 2017-09-02 NOTE — Telephone Encounter (Signed)
LMOVM that refill has been sent to pharmacy 

## 2017-09-02 NOTE — Telephone Encounter (Signed)
What is the name of the medication? Mobic in the generic Patient is out.  Have you contacted your pharmacy to request a refill? Yes  Which pharmacy would you like this sent to? [facility] and then one sent to [facility] for three month   Patient notified that their request is being sent to the clinical staff for review and that they should receive a call once it is complete. If they do not receive a call within 24 hours they can check with their pharmacy or our office.

## 2017-09-10 ENCOUNTER — Encounter: Payer: Self-pay | Admitting: Family Medicine

## 2017-09-10 ENCOUNTER — Ambulatory Visit (INDEPENDENT_AMBULATORY_CARE_PROVIDER_SITE_OTHER): Admitting: Family Medicine

## 2017-09-10 VITALS — BP 111/67 | HR 68 | Temp 97.6°F | Ht 73.0 in | Wt 231.6 lb

## 2017-09-10 DIAGNOSIS — R3911 Hesitancy of micturition: Secondary | ICD-10-CM

## 2017-09-10 DIAGNOSIS — N401 Enlarged prostate with lower urinary tract symptoms: Secondary | ICD-10-CM

## 2017-09-10 DIAGNOSIS — R300 Dysuria: Secondary | ICD-10-CM | POA: Diagnosis not present

## 2017-09-10 LAB — URINALYSIS, COMPLETE
Bilirubin, UA: NEGATIVE
Glucose, UA: NEGATIVE
Ketones, UA: NEGATIVE
LEUKOCYTES UA: NEGATIVE
Nitrite, UA: NEGATIVE
PH UA: 7 (ref 5.0–7.5)
Protein, UA: NEGATIVE
RBC, UA: NEGATIVE
Specific Gravity, UA: 1.02 (ref 1.005–1.030)
Urobilinogen, Ur: 0.2 mg/dL (ref 0.2–1.0)

## 2017-09-10 LAB — MICROSCOPIC EXAMINATION
BACTERIA UA: NONE SEEN
EPITHELIAL CELLS (NON RENAL): NONE SEEN /HPF (ref 0–10)
RENAL EPITHEL UA: NONE SEEN /HPF
WBC, UA: NONE SEEN /hpf (ref 0–?)

## 2017-09-10 MED ORDER — TAMSULOSIN HCL 0.4 MG PO CAPS: 0.4000 mg | ORAL_CAPSULE | Freq: Every day | ORAL | 3 refills | Status: DC

## 2017-09-10 NOTE — Progress Notes (Signed)
   HPI  Patient presents today here with dysuria and difficulty urinating.  Patient states that has been going on for about 2 weeks. He states that he is having difficulty urinating, post void dribbling, urinary hesitancy, and weak stream. He does have worsening when he takes hydroxyzine the night before, the symptoms are worse in the morning.  He works with his psychiatrist for anxiety and will discuss possibly using something different than hydroxyzine.  He also has mild dysuria which improved this morning. He denies fever, chills, sweats.  PMH: Smoking status noted ROS: Per HPI  Objective: BP 111/67   Pulse 68   Temp 97.6 F (36.4 C) (Oral)   Ht 6\' 1"  (1.854 m)   Wt 231 lb 9.6 oz (105.1 kg)   BMI 30.56 kg/m  Gen: NAD, alert, cooperative with exam HEENT: NCAT CV: RRR, good S1/S2, no murmur Resp: CTABL, no wheezes, non-labored Ext: No edema, warm Neuro: Alert and oriented, No gross deficits DRE-enlarged prostate symmetric with no nodules, nontender to palpation  Assessment and plan:  #BPH Most likely diagnosis Likely worsened by anticholinergic meds including hydroxyzine, Flexeril which she only uses as needed, and to a less likely degree Cymbalta.  Recommend considering stopping hydroxyzine if an alternative can be found, I would not stop Cymbalta. Starting Flomax, check PSA. Dysuria likely secondary symptom, UA clear   Orders Placed This Encounter  Procedures  . Urinalysis, Complete  . PSA    Meds ordered this encounter  Medications  . tamsulosin (FLOMAX) 0.4 MG CAPS capsule    Sig: Take 1 capsule (0.4 mg total) by mouth daily.    Dispense:  30 capsule    Refill:  3    Adam SinkSam Yarithza Mink, MD [facility] 09/10/2017, 10:30 AM

## 2017-09-10 NOTE — Patient Instructions (Signed)
Great to see you!  I have started you on flomax to help relax your urethra ( the urine tube) as it passes through the prostate.   It may be helpful to find a different pill than hydroxizine as it can make the prostate tighter.

## 2017-09-11 LAB — PSA: Prostate Specific Ag, Serum: 0.9 ng/mL (ref 0.0–4.0)

## 2017-10-15 ENCOUNTER — Ambulatory Visit (INDEPENDENT_AMBULATORY_CARE_PROVIDER_SITE_OTHER): Admitting: Psychiatry

## 2017-10-15 ENCOUNTER — Encounter (HOSPITAL_COMMUNITY): Payer: Self-pay | Admitting: Psychiatry

## 2017-10-15 VITALS — BP 115/75 | HR 64 | Ht 73.0 in | Wt 227.0 lb

## 2017-10-15 DIAGNOSIS — R450 Nervousness: Secondary | ICD-10-CM

## 2017-10-15 DIAGNOSIS — Z87891 Personal history of nicotine dependence: Secondary | ICD-10-CM | POA: Diagnosis not present

## 2017-10-15 DIAGNOSIS — Z811 Family history of alcohol abuse and dependence: Secondary | ICD-10-CM

## 2017-10-15 DIAGNOSIS — Z818 Family history of other mental and behavioral disorders: Secondary | ICD-10-CM | POA: Diagnosis not present

## 2017-10-15 DIAGNOSIS — F419 Anxiety disorder, unspecified: Secondary | ICD-10-CM | POA: Diagnosis not present

## 2017-10-15 DIAGNOSIS — F3110 Bipolar disorder, current episode manic without psychotic features, unspecified: Secondary | ICD-10-CM | POA: Diagnosis not present

## 2017-10-15 MED ORDER — DULOXETINE HCL 60 MG PO CPEP: 60.0000 mg | ORAL_CAPSULE | Freq: Every day | ORAL | 2 refills | Status: DC

## 2017-10-15 MED ORDER — LITHIUM CARBONATE ER 450 MG PO TBCR: 900.0000 mg | EXTENDED_RELEASE_TABLET | Freq: Every day | ORAL | 2 refills | Status: DC
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
## 2017-10-29 ENCOUNTER — Other Ambulatory Visit: Payer: Self-pay | Admitting: *Deleted

## 2017-10-29 MED ORDER — CETIRIZINE HCL 10 MG PO TABS: 10.0000 mg | ORAL_TABLET | Freq: Every day | ORAL | 0 refills | Status: DC

## 2017-10-29 MED ORDER — CETIRIZINE HCL 10 MG PO TABS: 10.0000 mg | ORAL_TABLET | Freq: Every day | ORAL | 1 refills | Status: DC

## 2017-10-29 NOTE — Telephone Encounter (Signed)
Called pt to verify refill on C[facility]ine [facility]

## 2017-11-04 ENCOUNTER — Other Ambulatory Visit: Payer: Self-pay | Admitting: Family Medicine

## 2017-11-04 DIAGNOSIS — I10 Essential (primary) hypertension: Secondary | ICD-10-CM

## 2017-11-04 MED ORDER — LISINOPRIL 10 MG PO TABS: 10.0000 mg | ORAL_TABLET | Freq: Every day | ORAL | 2 refills | Status: DC

## 2017-11-04 NOTE — Telephone Encounter (Signed)
Pt notified RX sent to pharmacy per pt request

## 2017-11-13 ENCOUNTER — Other Ambulatory Visit: Payer: Self-pay | Admitting: Family Medicine

## 2017-11-13 DIAGNOSIS — I10 Essential (primary) hypertension: Secondary | ICD-10-CM

## 2017-11-26 ENCOUNTER — Ambulatory Visit (INDEPENDENT_AMBULATORY_CARE_PROVIDER_SITE_OTHER): Admitting: Licensed Clinical Social Worker

## 2017-11-26 ENCOUNTER — Encounter (HOSPITAL_COMMUNITY): Payer: Self-pay | Admitting: Licensed Clinical Social Worker

## 2017-11-26 DIAGNOSIS — F3110 Bipolar disorder, current episode manic without psychotic features, unspecified: Secondary | ICD-10-CM

## 2017-11-26 DIAGNOSIS — F3160 Bipolar disorder, current episode mixed, unspecified: Secondary | ICD-10-CM | POA: Diagnosis not present

## 2017-11-26 NOTE — Progress Notes (Signed)
   THERAPIST PROGRESS NOTE  Session Time: 10:00 am -10:45 am  Participation Level: Active  Behavioral Response: CasualAlertAnxious  Type of Therapy: Individual Therapy  Treatment Goals addressed: Coping  Interventions: CBT and Solution Focused  Summary: Adam Oneal. Regnier is a 57 y.o. male who  presents oriented x5 (person, place, situation, time, and object), alert, average weight, average height, casually dressed, euthymic and cooperative to address mood and anxiety. Patient has a history of medical treatment including hypertension, degenerative disc disease and type 2 diabetes. Patient has a history of mental health treatment including medication management. Patient denies suicidal and homicidal ideations. Patient denies psychosis including auditory and visual hallucinations. Patient denies substance abuse. He is at low risk for lethality at this time.  Physically: Patient's back is feeling better. He reports that he is lifting weights and exercising more. He experiences nausea due to anxiety prior to work.  Spiritually/values: Patient feels like aliens are the Gods of the [place] and [place] ([place]) provided mankind with knowledge and technology.  Relationships:Patient has an ok relationship with his wife. She continues to gamble which impacts their money. Emotional/Mental/Behavioral: Patient shared delusional thoughts about the government hiding information about cannabis curing cancer, that there are towers on the moon and drilling is occurring on the moon, and aliens are the Gods of the [place]. Patient also expressed anxiety prior to getting to work. He is not a risk to himself or others.    Patient engaged in session. He responded well to interventions. Patient continues to meet criteria for Bipolar 1 disorder, mixed, moderate. Patient will continue in outpatient therapy due to being the least restrictive service to meet his needs at this time. Patient made moderate progress on his goals.    Suicidal/Homicidal: Negativewithout intent/plan  Therapist Response: Therapist reviewed patient's recent thoughts and behaviors. Therapist utilized CBT to address mood and anxiety. Therapist processed patient's feelings to identify triggers for mood. Therapist allowed space for patient to share his thoughts.   Plan: Return again in 4 weeks.   Diagnosis: Axis I: Bipolar, mixed    Axis II: No diagnosis    Bynum Bellows, LCSW 11/26/2017

## 2017-12-12 ENCOUNTER — Other Ambulatory Visit: Payer: Self-pay | Admitting: Family Medicine

## 2017-12-12 DIAGNOSIS — I10 Essential (primary) hypertension: Secondary | ICD-10-CM

## 2017-12-12 MED ORDER — TAMSULOSIN HCL 0.4 MG PO CAPS: 0.4000 mg | ORAL_CAPSULE | Freq: Every day | ORAL | 0 refills | Status: DC

## 2017-12-12 MED ORDER — LISINOPRIL 10 MG PO TABS: 10.0000 mg | ORAL_TABLET | Freq: Every day | ORAL | 0 refills | Status: DC

## 2017-12-12 NOTE — Telephone Encounter (Signed)
LMOVM refill sent to pharmacy 

## 2017-12-19 ENCOUNTER — Other Ambulatory Visit: Payer: Self-pay | Admitting: Family Medicine

## 2017-12-19 MED ORDER — MELOXICAM 15 MG PO TABS: 15.0000 mg | ORAL_TABLET | Freq: Every day | ORAL | 0 refills | Status: DC

## 2017-12-19 NOTE — Telephone Encounter (Signed)
PT wife is calling to states pt is completely out of his meloxicam (MOBIC) 15 MG tablet and needs refill sent to both  [facility]

## 2017-12-24 ENCOUNTER — Other Ambulatory Visit: Payer: Self-pay | Admitting: Family Medicine

## 2018-01-08 ENCOUNTER — Encounter (HOSPITAL_COMMUNITY): Payer: Self-pay | Admitting: Licensed Clinical Social Worker

## 2018-01-08 ENCOUNTER — Ambulatory Visit (INDEPENDENT_AMBULATORY_CARE_PROVIDER_SITE_OTHER): Admitting: Licensed Clinical Social Worker

## 2018-01-08 DIAGNOSIS — F3162 Bipolar disorder, current episode mixed, moderate: Secondary | ICD-10-CM

## 2018-01-08 NOTE — Progress Notes (Signed)
   THERAPIST PROGRESS NOTE  Session Time: 10:00 am -10:45 am  Participation Level: Active  Behavioral Response: CasualAlertAnxious  Type of Therapy: Individual Therapy  Treatment Goals addressed: Coping  Interventions: CBT and Solution Focused  Summary: Adam Oneal is a 57 y.o. male who  presents oriented x5 (person, place, situation, time, and object), alert, average weight, average height, casually dressed, euthymic and cooperative to address mood and anxiety. Patient has a history of medical treatment including hypertension, degenerative disc disease and type 2 diabetes. Patient has a history of mental health treatment including medication management. Patient denies suicidal and homicidal ideations. Patient denies psychosis including auditory and visual hallucinations. Patient denies substance abuse. He is at low risk for lethality at this time.  Physically: Patient reported that his testicles have swollen and thinks the may have bumped his crotch at work. If it continues he will go to the doctore.  Spiritually/values: No issues identified.   Relationships:Patient reports that things at home are ok. His wife continues to gamble. She invited people into the home to stay. They had not been cleaning up after themselves but are doing better.  Emotional/Mental/Behavioral: Patient has had moments of anxiety but nothing overwhelming. He reports that he is disappointed with others. He understands that he has expectations for others. Patient has been reading about aliens and conspiracy theories. He does this for fun. Patient presents as delusional at times but does not pose a risk to himself or others.    Patient engaged in session. He responded well to interventions. Patient continues to meet criteria for Bipolar 1 disorder, mixed, moderate. Patient will continue in outpatient therapy due to being the least restrictive service to meet his needs at this time. Patient made moderate progress on  his goals.   Suicidal/Homicidal: Negativewithout intent/plan  Therapist Response: Therapist reviewed patient's recent thoughts and behaviors. Therapist utilized CBT to address mood and anxiety. Therapist processed patient's feelings to identify triggers for mood. Therapist discussed how he manages his anxiety.   Plan: Return again in 4 weeks.   Diagnosis: Axis I: Bipolar, mixed    Axis II: No diagnosis    Adam BellowsJoshua Paul Torpey, LCSW 01/08/2018

## 2018-01-14 ENCOUNTER — Other Ambulatory Visit: Payer: Self-pay | Admitting: Family Medicine

## 2018-01-14 NOTE — Telephone Encounter (Signed)
What is the name of the medication? Meloxicam 15 mg Hadn't got the [facility] was [facility]. Wants #30.  Have you contacted your pharmacy to request a refill? NO  Which pharmacy would you like this sent to? [facility]   Patient notified that their request is being sent to the clinical staff for review and that they should receive a call once it is complete. If they do not receive a call within 24 hours they can check with their pharmacy or our office.

## 2018-01-14 NOTE — Telephone Encounter (Signed)
[facility]

## 2018-01-15 ENCOUNTER — Ambulatory Visit (HOSPITAL_COMMUNITY): Admitting: Psychiatry

## 2018-01-15 ENCOUNTER — Encounter (HOSPITAL_COMMUNITY): Payer: Self-pay

## 2018-01-15 ENCOUNTER — Encounter (HOSPITAL_COMMUNITY): Payer: Self-pay | Admitting: Psychiatry

## 2018-01-15 MED ORDER — LITHIUM CARBONATE ER 450 MG PO TBCR: 900.0000 mg | EXTENDED_RELEASE_TABLET | Freq: Every day | ORAL | 2 refills | Status: DC

## 2018-01-15 MED ORDER — DULOXETINE HCL 60 MG PO CPEP: 60.0000 mg | ORAL_CAPSULE | Freq: Every day | ORAL | 2 refills | Status: DC

## 2018-01-15 NOTE — Progress Notes (Unsigned)
Patient arrived with wife thinking he would be able to be seen along with her 9:45 appointment time. He was scheduled for a 10:45 appointment. And when Dr Tenny Crawoss said she couldn't see them together as a appointment because they have different appointment times, patient left. LVM asking patient if he was coming back to his appointment ?  Patient has left without bening seen.

## 2018-02-05 ENCOUNTER — Other Ambulatory Visit: Payer: Self-pay | Admitting: Family Medicine

## 2018-02-12 ENCOUNTER — Other Ambulatory Visit: Payer: Self-pay | Admitting: Family Medicine

## 2018-02-12 DIAGNOSIS — I10 Essential (primary) hypertension: Secondary | ICD-10-CM

## 2018-02-12 MED ORDER — LISINOPRIL 10 MG PO TABS: 10.0000 mg | ORAL_TABLET | Freq: Every day | ORAL | 0 refills | Status: DC

## 2018-02-12 NOTE — Telephone Encounter (Signed)
What is the name of the medication? lisinopril  Have you contacted your pharmacy to request a refill? yes  [facility]    Patient notified that their request is being sent to the clinical staff for review and that they should receive a call once it is complete. If they do not receive a call within 24 hours they can check with their pharmacy or our office.

## 2018-02-12 NOTE — Telephone Encounter (Signed)
Appt made for 03/05/18 [facility]

## 2018-03-05 ENCOUNTER — Ambulatory Visit: Admitting: Family Medicine

## 2018-03-08 ENCOUNTER — Other Ambulatory Visit: Payer: Self-pay | Admitting: Family Medicine

## 2018-03-08 ENCOUNTER — Other Ambulatory Visit: Payer: Self-pay | Admitting: Family

## 2018-03-08 DIAGNOSIS — I10 Essential (primary) hypertension: Secondary | ICD-10-CM

## 2018-03-08 DIAGNOSIS — R110 Nausea: Secondary | ICD-10-CM

## 2018-03-11 ENCOUNTER — Other Ambulatory Visit: Payer: Self-pay | Admitting: Family Medicine

## 2018-03-29 ENCOUNTER — Other Ambulatory Visit: Payer: Self-pay | Admitting: Family Medicine

## 2018-04-04 ENCOUNTER — Telehealth: Payer: Self-pay | Admitting: Family Medicine

## 2018-04-04 DIAGNOSIS — I10 Essential (primary) hypertension: Secondary | ICD-10-CM

## 2018-04-04 MED ORDER — TAMSULOSIN HCL 0.4 MG PO CAPS: 0.4000 mg | ORAL_CAPSULE | Freq: Every day | ORAL | 0 refills | Status: DC

## 2018-04-04 MED ORDER — MELOXICAM 15 MG PO TABS: 15.0000 mg | ORAL_TABLET | Freq: Every day | ORAL | 0 refills | Status: DC

## 2018-04-04 MED ORDER — LISINOPRIL 10 MG PO TABS: 10.0000 mg | ORAL_TABLET | Freq: Every day | ORAL | 0 refills | Status: DC

## 2018-04-04 NOTE — Telephone Encounter (Signed)
Refills sent - wife aware

## 2018-04-04 NOTE — Telephone Encounter (Signed)
Patient was last seen for chronic follow up on 07/02/17; has appointment with you on 04/10/18.  Needs 30 day refill of Mobic, Lisinopril and Flomax sent to [facility], please advise.

## 2018-04-04 NOTE — Telephone Encounter (Signed)
pts wife called he needs rf meloxicam, lisinopril, tamsulosin but needs asap b/c he is almost out - so need [facility] supply [facility] to [facility]. Tried to make appt with Dr. Louanne Skye but he is booked and he needs rf asap. Didn't know if nurse could get him in sooner for an appt.

## 2018-04-04 NOTE — Telephone Encounter (Signed)
Yes go ahead and do 30-day refill for all of those medications including Mobic lisinopril and Flomax

## 2018-04-10 ENCOUNTER — Encounter (HOSPITAL_COMMUNITY): Payer: Self-pay | Admitting: Licensed Clinical Social Worker

## 2018-04-10 ENCOUNTER — Encounter: Payer: Self-pay | Admitting: Family Medicine

## 2018-04-10 ENCOUNTER — Ambulatory Visit (INDEPENDENT_AMBULATORY_CARE_PROVIDER_SITE_OTHER): Admitting: Licensed Clinical Social Worker

## 2018-04-10 ENCOUNTER — Ambulatory Visit (HOSPITAL_COMMUNITY): Admitting: Licensed Clinical Social Worker

## 2018-04-10 ENCOUNTER — Ambulatory Visit (INDEPENDENT_AMBULATORY_CARE_PROVIDER_SITE_OTHER): Admitting: Family Medicine

## 2018-04-10 VITALS — BP 114/78 | HR 72 | Temp 98.8°F | Ht 73.0 in | Wt 242.0 lb

## 2018-04-10 DIAGNOSIS — E119 Type 2 diabetes mellitus without complications: Secondary | ICD-10-CM

## 2018-04-10 DIAGNOSIS — K219 Gastro-esophageal reflux disease without esophagitis: Secondary | ICD-10-CM

## 2018-04-10 DIAGNOSIS — F3162 Bipolar disorder, current episode mixed, moderate: Secondary | ICD-10-CM

## 2018-04-10 DIAGNOSIS — E785 Hyperlipidemia, unspecified: Secondary | ICD-10-CM

## 2018-04-10 DIAGNOSIS — I10 Essential (primary) hypertension: Secondary | ICD-10-CM | POA: Diagnosis not present

## 2018-04-10 DIAGNOSIS — E1169 Type 2 diabetes mellitus with other specified complication: Secondary | ICD-10-CM

## 2018-04-10 DIAGNOSIS — E1159 Type 2 diabetes mellitus with other circulatory complications: Secondary | ICD-10-CM

## 2018-04-10 LAB — BAYER DCA HB A1C WAIVED: HB A1C (BAYER DCA - WAIVED): 6.2 % (ref <7.0)

## 2018-04-10 MED ORDER — CETIRIZINE HCL 10 MG PO TABS: 10.0000 mg | ORAL_TABLET | Freq: Every day | ORAL | 3 refills | Status: DC

## 2018-04-10 MED ORDER — CYCLOBENZAPRINE HCL 10 MG PO TABS: 10.0000 mg | ORAL_TABLET | Freq: Three times a day (TID) | ORAL | 3 refills | Status: DC | PRN

## 2018-04-10 MED ORDER — DULOXETINE HCL 60 MG PO CPEP: 60.0000 mg | ORAL_CAPSULE | Freq: Every day | ORAL | 3 refills | Status: DC

## 2018-04-10 MED ORDER — MOMETASONE FUROATE 50 MCG/ACT NA SUSP: 2.0000 | Freq: Every day | NASAL | 5 refills | Status: DC

## 2018-04-10 MED ORDER — LITHIUM CARBONATE ER 450 MG PO TBCR: 900.0000 mg | EXTENDED_RELEASE_TABLET | Freq: Every day | ORAL | 1 refills | Status: DC

## 2018-04-10 MED ORDER — MELOXICAM 15 MG PO TABS: 15.0000 mg | ORAL_TABLET | Freq: Every day | ORAL | 3 refills | Status: DC

## 2018-04-10 MED ORDER — TAMSULOSIN HCL 0.4 MG PO CAPS: 0.4000 mg | ORAL_CAPSULE | Freq: Every day | ORAL | 3 refills | Status: DC

## 2018-04-10 MED ORDER — LISINOPRIL 10 MG PO TABS: 10.0000 mg | ORAL_TABLET | Freq: Every day | ORAL | 3 refills | Status: DC

## 2018-04-10 NOTE — Progress Notes (Signed)
   THERAPIST PROGRESS NOTE  Session Time: 10:00 am -10:45 am  Participation Level: Active  Behavioral Response: CasualAlertAnxious  Type of Therapy: Individual Therapy  Treatment Goals addressed: Coping  Interventions: CBT and Solution Focused  Summary: Adam PouRobert S. Doroteo Glassmanhelps is a 57 y.o. male who  presents oriented x5 (person, place, situation, time, and object), alert, average weight, average height, casually dressed, euthymic and cooperative to address mood and anxiety. Patient has a history of medical treatment including hypertension, degenerative disc disease and type 2 diabetes. Patient has a history of mental health treatment including medication management. Patient denies suicidal and homicidal ideations. Patient denies psychosis including auditory and visual hallucinations. Patient denies substance abuse. He is at low risk for lethality at this time.  Physically: Patient reported some pain due to arthritis and work. He continues to workout and take care of himself.  Spiritually/values: No issues identified.   Relationships:Patient reported things are good with his wife. He has had to make adjustments to her spending so they can afford to pay their bills.  Emotional/Mental/Behavioral: Patient's mood is stable. He denies anger, anxiety, and depression. Patient is enjoying his work and getting along with people at work.    Patient engaged in session. He responded well to interventions. Patient continues to meet criteria for Bipolar 1 disorder, mixed, moderate. Patient will continue in outpatient therapy due to being the least restrictive service to meet his needs at this time. Patient made moderate progress on his goals.   Suicidal/Homicidal: Negativewithout intent/plan  Therapist Response: Therapist reviewed patient's recent thoughts and behaviors. Therapist utilized CBT to address mood and anxiety. Therapist processed patient's feelings to identify triggers for mood. Therapist had patient  identify what has gone well for him and how he can continue to have a stable mood.   Plan: Return again in 4 weeks.   Diagnosis: Axis I: Bipolar, mixed    Axis II: No diagnosis    Adam BellowsJoshua Ashayla Subia, LCSW 04/10/2018

## 2018-04-10 NOTE — Progress Notes (Signed)
BP 114/78   Pulse 72   Temp 98.8 F (37.1 C) (Oral)   Ht '6\' 1"'  (1.854 m)   Wt 242 lb (109.8 kg)   BMI 31.93 kg/m    Subjective:    Patient ID: Adam Oneal, male    DOB: 1961/03/23, 57 y.o.   MRN: [id number]  HPI: Adam Oneal is a 57 y.o. male presenting on 04/10/2018 for Diabetes (6 month follow up) and Hypertension   HPI Type 2 diabetes mellitus Patient comes in today for recheck of his diabetes. Patient has been currently taking no medication, has been controlled previously but does admit that is been eating more poorly recently. Patient is currently on an ACE inhibitor/ARB. Patient has not seen an ophthalmologist this year. Patient denies any issues with their feet.   Hypertension Patient is currently on lisinopril, and their blood pressure today is 114/78. Patient denies any lightheadedness or dizziness. Patient denies headaches, blurred vision, chest pains, shortness of breath, or weakness. Denies any side effects from medication and is content with current medication.   Hyperlipidemia Patient is coming in for recheck of his hyperlipidemia. The patient is currently taking no medication and we are just monitoring for now and he has been doing diet to control this of. They deny any issues with myalgias or history of liver damage from it. They deny any focal numbness or weakness or chest pain.   GERD Patient is currently on no medication currently and says that he is doing well.  She denies any major symptoms or abdominal pain or belching or burping. She denies any blood in her stool or lightheadedness or dizziness.   Bipolar Patient is coming in for a recheck on bipolar.  He typically sees Dr. Harrington Challenger and also has counseling over at Dr. Alan Ripper office for this.  He is currently on lithium and Cymbalta and says that the Cymbalta helps more with pain the lithium is really what helps him with his mood.  He wants to get his blood work checked today and he will follow-up with Dr.  Harrington Challenger on these medications.  He denies any suicidal ideations or thoughts of hurting himself.  Relevant past medical, surgical, family and social history reviewed and updated as indicated. Interim medical history since our last visit reviewed. Allergies and medications reviewed and updated.  Review of Systems  Constitutional: Negative for chills and fever.  Eyes: Negative for photophobia.  Respiratory: Negative for shortness of breath and wheezing.   Cardiovascular: Negative for chest pain and leg swelling.  Musculoskeletal: Negative for back pain and gait problem.  Skin: Negative for rash.  Neurological: Negative for dizziness, weakness and light-headedness.  Psychiatric/Behavioral: Negative for behavioral problems, decreased concentration, dysphoric mood, self-injury, sleep disturbance and suicidal ideas. The patient is not nervous/anxious.   All other systems reviewed and are negative.   Per HPI unless specifically indicated above   Allergies as of 04/10/2018      Reactions   Lipitor [atorvastatin] Shortness Of Breath      Medication List        Accurate as of 04/10/18  8:40 AM. Always use your most recent med list.          cetirizine 10 MG tablet Commonly known as:  ZYRTEC Take 1 tablet (10 mg total) by mouth daily.   cyclobenzaprine 10 MG tablet Commonly known as:  FLEXERIL Take 1 tablet (10 mg total) by mouth 3 (three) times daily as needed for muscle spasms.   DULoxetine 60  MG capsule Commonly known as:  CYMBALTA Take 1 capsule (60 mg total) by mouth daily.   fluticasone 50 MCG/ACT nasal spray Commonly known as:  FLONASE Place 2 sprays into both nostrils daily.   lisinopril 10 MG tablet Commonly known as:  PRINIVIL,ZESTRIL Take 1 tablet (10 mg total) by mouth daily.   lithium carbonate 450 MG CR tablet Commonly known as:  ESKALITH Take 2 tablets (900 mg total) by mouth at bedtime.   meloxicam 15 MG tablet Commonly known as:  MOBIC Take 1 tablet (15 mg  total) by mouth daily.   mometasone 50 MCG/ACT nasal spray Commonly known as:  NASONEX Place 2 sprays into the nose daily.   tamsulosin 0.4 MG Caps capsule Commonly known as:  FLOMAX Take 1 capsule (0.4 mg total) by mouth daily.          Objective:    BP 114/78   Pulse 72   Temp 98.8 F (37.1 C) (Oral)   Ht '6\' 1"'  (1.854 m)   Wt 242 lb (109.8 kg)   BMI 31.93 kg/m   Wt Readings from Last 3 Encounters:  04/10/18 242 lb (109.8 kg)  09/10/17 231 lb 9.6 oz (105.1 kg)  07/02/17 227 lb (103 kg)    Physical Exam  Constitutional: He is oriented to person, place, and time. He appears well-developed and well-nourished. No distress.  Eyes: Conjunctivae are normal. No scleral icterus.  Neck: Neck supple. No thyromegaly present.  Cardiovascular: Normal rate, regular rhythm, normal heart sounds and intact distal pulses.  No murmur heard. Pulmonary/Chest: Effort normal and breath sounds normal. No respiratory distress. He has no wheezes.  Musculoskeletal: Normal range of motion. He exhibits no edema.  Lymphadenopathy:    He has no cervical adenopathy.  Neurological: He is alert and oriented to person, place, and time. Coordination normal.  Skin: Skin is warm and dry. No rash noted. He is not diaphoretic.  Psychiatric: He has a normal mood and affect. His behavior is normal. Judgment normal. His speech is tangential (Patient was normal most of exam but then did go on one tangent where he said that there were buildings on the mood that he could see through the stethoscope). His speech is not rapid and/or pressured, not delayed and not slurred. He expresses no suicidal ideation. He expresses no suicidal plans.  Nursing note and vitals reviewed.       Assessment & Plan:   Problem List Items Addressed This Visit      Cardiovascular and Mediastinum   Hypertension associated with diabetes ([place])   Relevant Medications   lisinopril (PRINIVIL,ZESTRIL) 10 MG tablet     Digestive   GERD  (gastroesophageal reflux disease)     Endocrine   Type 2 diabetes mellitus without complication, without long-term current use of insulin (HCC) - Primary   Relevant Medications   lisinopril (PRINIVIL,ZESTRIL) 10 MG tablet   Other Relevant Orders   Bayer DCA Hb A1c Waived   CMP14+EGFR   Hyperlipidemia associated with type 2 diabetes mellitus (HCC)   Relevant Medications   lisinopril (PRINIVIL,ZESTRIL) 10 MG tablet   Other Relevant Orders   Lipid panel     Other   Bipolar 1 disorder, mixed, moderate (HCC)   Relevant Orders   CBC with Differential/Platelet   TSH   Lithium level    Other Visit Diagnoses    Essential hypertension, benign       Relevant Medications   lisinopril (PRINIVIL,ZESTRIL) 10 MG tablet   Other Relevant Orders  CBC with Differential/Platelet      We will refill patient's medications including lithium and Cymbalta today but patient will still continue to follow-up with Dr. Harrington Challenger his psychiatrist for management of his lithium, we will also check a lithium level and thyroid and renal function so that Dr. Harrington Challenger can monitor this a little more closely. Follow up plan: Return in about 6 months (around 10/09/2018), or if symptoms worsen or fail to improve, for Follow-up diabetes and hypertension.  Counseling provided for all of the vaccine components Orders Placed This Encounter  Procedures  . Bayer DCA Hb A1c Waived  . CBC with Differential/Platelet  . CMP14+EGFR  . Lipid panel  . TSH  . Lithium level    Caryl Pina, MD [facility] 04/10/2018, 8:40 AM

## 2018-04-11 LAB — CBC WITH DIFFERENTIAL/PLATELET
BASOS: 0 %
Basophils Absolute: 0 10*3/uL (ref 0.0–0.2)
EOS (ABSOLUTE): 0.4 10*3/uL (ref 0.0–0.4)
EOS: 4 %
HEMATOCRIT: 45.5 % (ref 37.5–51.0)
HEMOGLOBIN: 14.9 g/dL (ref 13.0–17.7)
Immature Grans (Abs): 0 10*3/uL (ref 0.0–0.1)
Immature Granulocytes: 0 %
Lymphocytes Absolute: 3.1 10*3/uL (ref 0.7–3.1)
Lymphs: 30 %
MCH: 29.7 pg (ref 26.6–33.0)
MCHC: 32.7 g/dL (ref 31.5–35.7)
MCV: 91 fL (ref 79–97)
MONOCYTES: 15 %
Monocytes Absolute: 1.6 10*3/uL — ABNORMAL HIGH (ref 0.1–0.9)
Neutrophils Absolute: 5.3 10*3/uL (ref 1.4–7.0)
Neutrophils: 51 %
Platelets: 322 10*3/uL (ref 150–450)
RBC: 5.02 x10E6/uL (ref 4.14–5.80)
RDW: 12.4 % (ref 12.3–15.4)
WBC: 10.5 10*3/uL (ref 3.4–10.8)

## 2018-04-11 LAB — CMP14+EGFR
A/G RATIO: 2.2 (ref 1.2–2.2)
ALBUMIN: 4.6 g/dL (ref 3.5–5.5)
ALT: 18 IU/L (ref 0–44)
AST: 17 IU/L (ref 0–40)
Alkaline Phosphatase: 96 IU/L (ref 39–117)
BUN / CREAT RATIO: 19 (ref 9–20)
BUN: 15 mg/dL (ref 6–24)
Bilirubin Total: 0.2 mg/dL (ref 0.0–1.2)
CO2: 21 mmol/L (ref 20–29)
Calcium: 9.5 mg/dL (ref 8.7–10.2)
Chloride: 106 mmol/L (ref 96–106)
Creatinine, Ser: 0.8 mg/dL (ref 0.76–1.27)
GFR calc Af Amer: 114 mL/min/{1.73_m2} (ref >59)
GFR, EST NON AFRICAN AMERICAN: 98 mL/min/{1.73_m2} (ref >59)
GLOBULIN, TOTAL: 2.1 g/dL (ref 1.5–4.5)
Glucose: 126 mg/dL — ABNORMAL HIGH (ref 65–99)
POTASSIUM: 4.9 mmol/L (ref 3.5–5.2)
SODIUM: 141 mmol/L (ref 134–144)
Total Protein: 6.7 g/dL (ref 6.0–8.5)

## 2018-04-11 LAB — LIPID PANEL
CHOL/HDL RATIO: 2.5 ratio (ref 0.0–5.0)
Cholesterol, Total: 147 mg/dL (ref 100–199)
HDL: 58 mg/dL (ref >39)
LDL Calculated: 73 mg/dL (ref 0–99)
Triglycerides: 81 mg/dL (ref 0–149)
VLDL Cholesterol Cal: 16 mg/dL (ref 5–40)

## 2018-04-11 LAB — TSH: TSH: 0.893 u[IU]/mL (ref 0.450–4.500)

## 2018-04-11 LAB — LITHIUM LEVEL: LITHIUM LVL: 0.4 mmol/L — AB (ref 0.6–1.2)

## 2018-04-17 ENCOUNTER — Ambulatory Visit (HOSPITAL_COMMUNITY): Admitting: Psychiatry

## 2018-04-23 ENCOUNTER — Encounter (HOSPITAL_COMMUNITY): Payer: Self-pay | Admitting: Psychiatry

## 2018-04-23 ENCOUNTER — Ambulatory Visit (INDEPENDENT_AMBULATORY_CARE_PROVIDER_SITE_OTHER): Admitting: Psychiatry

## 2018-04-23 VITALS — BP 119/80 | HR 54 | Ht 73.0 in | Wt 244.0 lb

## 2018-04-23 DIAGNOSIS — F3162 Bipolar disorder, current episode mixed, moderate: Secondary | ICD-10-CM

## 2018-04-23 DIAGNOSIS — Z87891 Personal history of nicotine dependence: Secondary | ICD-10-CM

## 2018-04-23 NOTE — Progress Notes (Signed)
BH MD/PA/NP OP Progress Note  04/23/2018 10:48 AM Rose Fillers. [place]  MRN:  [id number]  Chief Complaint:  Chief Complaint    Depression; Anxiety; Manic Behavior; Follow-up     HPI: This patient is a 57 year old married white male who lives with his wife in [place]. 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 The couple have no children   The patient was referred by his primary care provider from [place]  family medicine for further assessment and treatment of bipolar disorder.  The patient is somewhat of a vague historian and isn't quite sure how he got diagnosed with bipolar disorder. He states that he has had "mood swings" all his life. He went into the [employer] at 57 years of age and got out 39 years later. He states that his mood was up and down throughout the whole time but he dealt with that by drinking quite a bit of alcohol as well as caffeine during the day. 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 His moods are "up and down" he states that at times he does have symptoms of overt mania such as inability to sleep and hypersexuality.  In the fall and winter he becomes more depressed and tired with low energy and motivation. He has been on lithium for a number of years in his current level is 0.6. Since he moved here he has had to work the night shift for the first time in his life. He has difficulty sleeping during the day and also doesn't remember to take off his medications. He states that he tends to obsess and wants his house spotlessly clean. At times he rants and raves about situations at work but would never really hurt anybody or himself. He's never had psychiatric inpatient treatment. His wife describes him as "very anxious" and unhappy with his job. He calls in sick about once a month. He has no friends and doesn't really trust other people. 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  He was here 3 months ago but walked out because he did not want to wait for his appointment.  In the interim however he has continued his medication although at times he "runs out".  He saw his primary doctor, Dr. Warrick Parisian earlier this month to check the lithium level and it was 0.4.  All of his other labs including thyroid panel CBC and basic metabolic panel were all normal.  He claims he had been off lithium the day before the test.  He also claims that he is doing better with compliance and is taking it every day now as well as a Cymbalta.  He states that he is doing well at work and is getting along much better with other people.  He tends to obsess about his car tire pressure but other than that he has not had as many obsessional symptoms.  He is sleeping well and his energy is fairly good.  He denies any recent temper flares or blowups. Visit Diagnosis:    ICD-10-CM   1. Bipolar 1 disorder, mixed, moderate (HCC) F31.62     Past Psychiatric History: Long-term outpatient treatment for bipolar disorder  Past Medical History:  Past Medical History:  Diagnosis Date  . Acid reflux   . Anxiety  01/1998  . Cataract 02/2004  . Depression 01/1998  . Diabetes mellitus without complication ([place]) 37/1696  . Diabetes mellitus, type II ([place])   . Hyperlipidemia 11/2006  . Hypertension   . Hypertension    History reviewed. No pertinent surgical history.  Family Psychiatric History: See below  Family History:  Family History  Problem Relation Age of Onset  . Cancer Mother 16       lung  . Heart disease Father   . Hyperlipidemia Father   . Diabetes Brother   . Leukemia Brother   . Diabetes Paternal Grandmother   . Heart disease Paternal Grandmother   . Alcohol abuse Paternal Grandfather   . Diabetes Brother   . Depression Maternal Aunt     Social History:  Social History   Socioeconomic History   . Marital status: Married    Spouse name: Not on file  . Number of children: Not on file  . Years of education: Not on file  . Highest education level: Not on file  Occupational History  . Not on file  Social Needs  . Financial resource strain: Not on file  . Food insecurity:    Worry: Not on file    Inability: Not on file  . Transportation needs:    Medical: Not on file    Non-medical: Not on file  Tobacco Use  . Smoking status: Former Smoker    Packs/day: 1.00    Years: 12.00    Pack years: 12.00    Types: Cigarettes    Start date: 01/05/1978    Last attempt to quit: 12/28/1989    Years since quitting: 28.3  . Smokeless tobacco: Former Network engineer and Sexual Activity  . Alcohol use: No    Alcohol/week: 0.0 standard drinks    Comment: 03-26-16 per pt no  . Drug use: No    Comment: 03-26-16 per pt currently no but use to  . Sexual activity: Never  Lifestyle  . Physical activity:    Days per week: Not on file    Minutes per session: Not on file  . Stress: Not on file  Relationships  . Social connections:    Talks on phone: Not on file    Gets together: Not on file    Attends religious service: Not on file    Active member of club or organization: Not on file    Attends meetings of clubs or organizations: Not on file    Relationship status: Not on file  Other Topics Concern  . Not on file  Social History Narrative  . Not on file    Allergies:  Allergies  Allergen Reactions  . Lipitor [Atorvastatin] Shortness Of Breath    Metabolic Disorder Labs: Lab Results  Component Value Date   HGBA1C 6.1 09/06/2015   No results found for: PROLACTIN Lab Results  Component Value Date   CHOL 147 04/10/2018   TRIG 81 04/10/2018   HDL 58 04/10/2018   CHOLHDL 2.5 04/10/2018   LDLCALC 73 04/10/2018   LDLCALC 70 07/02/2017   Lab Results  Component Value Date   TSH 0.893 04/10/2018   TSH 2.270 12/03/2016    Therapeutic Level Labs: Lab Results  Component Value  Date   LITHIUM 0.4 (L) 04/10/2018   LITHIUM 0.6 06/12/2017   No results found for: VALPROATE No components found for:  CBMZ  Current Medications: Current Outpatient Medications  Medication Sig Dispense Refill  . cetirizine (ZYRTEC) 10 MG tablet Take 1 tablet (  10 mg total) by mouth daily. 90 tablet 3  . cyclobenzaprine (FLEXERIL) 10 MG tablet Take 1 tablet (10 mg total) by mouth 3 (three) times daily as needed for muscle spasms. 270 tablet 3  . DULoxetine (CYMBALTA) 60 MG capsule Take 1 capsule (60 mg total) by mouth daily. 90 capsule 3  . fluticasone (FLONASE) 50 MCG/ACT nasal spray Place 2 sprays into both nostrils daily. (Patient taking differently: Place 2 sprays into both nostrils as needed. ) 16 g 11  . lisinopril (PRINIVIL,ZESTRIL) 10 MG tablet Take 1 tablet (10 mg total) by mouth daily. 90 tablet 3  . lithium carbonate (ESKALITH) 450 MG CR tablet Take 2 tablets (900 mg total) by mouth at bedtime. 180 tablet 1  . meloxicam (MOBIC) 15 MG tablet Take 1 tablet (15 mg total) by mouth daily. 90 tablet 3  . mometasone (NASONEX) 50 MCG/ACT nasal spray Place 2 sprays into the nose daily. 17 g 5  . tamsulosin (FLOMAX) 0.4 MG CAPS capsule Take 1 capsule (0.4 mg total) by mouth daily. 90 capsule 3   No current facility-administered medications for this visit.      Musculoskeletal: Strength & Muscle Tone: within normal limits Gait & Station: normal Patient leans: N/A  Psychiatric Specialty Exam: Review of Systems  All other systems reviewed and are negative.   Blood pressure 119/80, pulse (!) 54, height '6\' 1"'  (1.854 m), weight 244 lb (110.7 kg), SpO2 96 %.Body mass index is 32.19 kg/m.  General Appearance: Casual and Fairly Groomed  Eye Contact:  Good  Speech:  Clear and Coherent  Volume:  Normal  Mood:  Euthymic  Affect:  Congruent  Thought Process:  Goal Directed  Orientation:  Full (Time, Place, and Person)  Thought Content: Obsessions and Rumination   Suicidal Thoughts:  No   Homicidal Thoughts:  No  Memory:  Immediate;   Good Recent;   Good Remote;   Fair  Judgement:  Fair  Insight:  Fair  Psychomotor Activity:  Normal  Concentration:  Concentration: Good and Attention Span: Good  Recall:  Good  Fund of Knowledge: Good  Language: Good  Akathisia:  No  Handed:  Right  AIMS (if indicated): not done  Assets:  Communication Skills Desire for Improvement Physical Health Resilience Social Support Talents/Skills  ADL's:  Intact  Cognition: WNL  Sleep:  Good   Screenings: PHQ2-9     Office Visit from 04/10/2018 in [facility] Office Visit from 09/10/2017 in [facility] [facility] Office Visit from 01/31/2017 in [facility] Office Visit from 01/14/2017 in [facility] Office Visit from 12/03/2016 in [facility]  PHQ-2 Total Score  0  0  0  0  0       Assessment and Plan: This patient is a 57 year old male with a history of bipolar disorder.  At times he does mention some strange preoccupations about UFOs and other things but for the most part he functions well as long as he is compliant with medications.  He will continue lithium carbonate 900 mg daily for mood stabilization and Cymbalta 60 mg daily for depression.  We will recheck his lithium level next visit in 3 months.   Levonne Spiller, MD 04/23/2018, 10:48 AM

## 2018-07-10 ENCOUNTER — Encounter (HOSPITAL_COMMUNITY): Payer: Self-pay | Admitting: Licensed Clinical Social Worker

## 2018-07-10 ENCOUNTER — Ambulatory Visit (INDEPENDENT_AMBULATORY_CARE_PROVIDER_SITE_OTHER): Admitting: Licensed Clinical Social Worker

## 2018-07-10 DIAGNOSIS — F3162 Bipolar disorder, current episode mixed, moderate: Secondary | ICD-10-CM

## 2018-07-10 NOTE — Progress Notes (Signed)
   THERAPIST PROGRESS NOTE  Session Time: 9:00 am -9:45 am  Participation Level: Active  Behavioral Response: CasualAlertAnxious  Type of Therapy: Individual Therapy  Treatment Goals addressed: Coping  Interventions: CBT and Solution Focused  Summary: Adam PouRobert S. Doroteo Glassmanhelps is a 57 y.o. male who  presents oriented x5 (person, place, situation, time, and object), alert, average weight, average height, casually dressed, euthymic and cooperative to address mood and anxiety. Patient has a history of medical treatment including hypertension, degenerative disc disease and type 2 diabetes. Patient has a history of mental health treatment including medication management. Patient denies suicidal and homicidal ideations. Patient denies psychosis including auditory and visual hallucinations. Patient denies substance abuse. He is at low risk for lethality at this time.  Physically: Patient is stretching and exercising. He is experiencing some hip pain if he sits too long.  Spiritually/values: No issues identified.   Relationships:Patient reported that his wife if gambling a lot. He thinks that she is paying the bills but is not sure. Patient is concerned that he may have to kick her out so he can retire but hopes it doesn't go to that.  Emotional/Mental/Behavioral: Patient's mood is stable but he stresses about work. He is doing his job and has no concerns at work. He has a person at work that [employer] Corporationgossips that he is trying to avoid. Patient studies a lot of UFO and conspiracy theory information online. He denies paranoia. He thinks about a lot of things and is in his head a lot. Patient tries to talk to, smile at and interact with people at work. This helps him get out of his head.    Patient engaged in session. He responded well to interventions. Patient continues to meet criteria for Bipolar 1 disorder, mixed, moderate. Patient will continue in outpatient therapy due to being the least restrictive service to meet  his needs at this time. Patient made moderate progress on his goals.   Suicidal/Homicidal: Negativewithout intent/plan  Therapist Response: Therapist reviewed patient's recent thoughts and behaviors. Therapist utilized CBT to address mood and anxiety. Therapist processed patient's feelings to identify triggers for mood. Therapist discussed patient's mood and stress.   Plan: Return again in 4 weeks.   Diagnosis: Axis I: Bipolar, mixed    Axis II: No diagnosis    Adam BellowsJoshua Ky Moskowitz, LCSW 07/10/2018

## 2018-07-16 ENCOUNTER — Ambulatory Visit (HOSPITAL_COMMUNITY): Admitting: Psychiatry

## 2018-07-31 ENCOUNTER — Ambulatory Visit (HOSPITAL_COMMUNITY): Admitting: Psychiatry

## 2018-08-27 ENCOUNTER — Encounter (HOSPITAL_COMMUNITY): Payer: Self-pay | Admitting: Psychiatry

## 2018-08-27 ENCOUNTER — Ambulatory Visit (INDEPENDENT_AMBULATORY_CARE_PROVIDER_SITE_OTHER): Admitting: Psychiatry

## 2018-08-27 VITALS — BP 114/70 | HR 64 | Ht 73.0 in | Wt 253.0 lb

## 2018-08-27 DIAGNOSIS — F3162 Bipolar disorder, current episode mixed, moderate: Secondary | ICD-10-CM | POA: Diagnosis not present

## 2018-08-27 MED ORDER — DULOXETINE HCL 60 MG PO CPEP: 60.0000 mg | ORAL_CAPSULE | Freq: Every day | ORAL | 3 refills | Status: DC

## 2018-08-27 MED ORDER — LITHIUM CARBONATE ER 450 MG PO TBCR: 900.0000 mg | EXTENDED_RELEASE_TABLET | Freq: Every day | ORAL | 1 refills | Status: DC

## 2018-08-27 NOTE — Progress Notes (Signed)
BH MD/PA/NP OP Progress Note  08/27/2018 10:36 AM Adam Oneal  MRN:  [id number]  Chief Complaint:  Chief Complaint    Anxiety; Depression; Manic Behavior; Follow-up     HPI: This patient is a 58 year old married white male who lives with his wife in [place]. 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 The couple have no children   The patient was referred by his primary care provider from [facility] for further assessment and treatment of bipolar disorder.  The patient is somewhat of a vague historian and isn't quite sure how he got diagnosed with bipolar disorder. He states that he has had "mood swings" all his life. He went into the [employer] at 58 years of age and got out 2 years later. He states that his mood was up and down throughout the whole time but he dealt with that by drinking quite a bit of alcohol as well as caffeine during the day. 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 His moods are "up and down" he states that at times he does have symptoms of overt mania such as inability to sleep and hypersexuality.  In the fall and winter he becomes more depressed and tired with low energy and motivation. He has been on lithium for a number of years in his current level is 0.6. Since he moved here he has had to work the night shift for the first time in his life. He has difficulty sleeping during the day and also doesn't remember to take off his medications. He states that he tends to obsess and wants his house spotlessly clean. At times he rants and raves about situations at work but would never really hurt anybody or himself. He's never had psychiatric inpatient treatment. His wife describes him as "very anxious" and unhappy with his job. He calls in sick about once a month. He has no friends and doesn't really trust other people. 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  Overall he states he is doing well.  His work life is going okay and is not had any conflicts with anybody.  He states that sometimes in the winter he feels little bit down but for the most part his mood has been stable and he is not having the severe ups and downs that he had before.  He claims that he is compliant with his medication.  He still gets anxious before work but once he gets there he is okay.  He is sleeping well.  His energy is fairly good.  His thought process is generally organized.  He denies any manic symptoms.  He denies suicidal ideation. Visit Diagnosis:    ICD-10-CM   1. Bipolar 1 disorder, mixed, moderate (HCC) F31.62     Past Psychiatric History: Long-term outpatient treatment for bipolar disorder  Past Medical History:  Past Medical History:  Diagnosis Date  . Acid reflux   . Anxiety 01/1998  . Cataract 02/2004  . Depression 01/1998  . Diabetes mellitus without complication ([place]) 68/3729  . Diabetes mellitus, type II ([place])   . Hyperlipidemia 11/2006  . Hypertension   . Hypertension    History reviewed. No pertinent surgical history.  Family Psychiatric History: See below  Family History:  Family History  Problem Relation Age of Onset  . Cancer Mother 81       lung  . Heart disease Father   . Hyperlipidemia Father   . Diabetes Brother   . Leukemia Brother   . Diabetes Paternal Grandmother   . Heart disease Paternal Grandmother   . Alcohol abuse Paternal Grandfather   . Diabetes Brother   . Depression Maternal Aunt     Social History:  Social History   Socioeconomic History  . Marital status: Married    Spouse name: Not on file  . Number of children: Not on file  . Years of education: Not on file  . Highest education level: Not on file  Occupational History  . Not on file  Social Needs  . Financial resource strain: Not on file  . Food insecurity:     Worry: Not on file    Inability: Not on file  . Transportation needs:    Medical: Not on file    Non-medical: Not on file  Tobacco Use  . Smoking status: Former Smoker    Packs/day: 1.00    Years: 12.00    Pack years: 12.00    Types: Cigarettes    Start date: 01/05/1978    Last attempt to quit: 12/28/1989    Years since quitting: 28.6  . Smokeless tobacco: Former Network engineer and Sexual Activity  . Alcohol use: No    Alcohol/week: 0.0 standard drinks    Comment: 03-26-16 per pt no  . Drug use: No    Comment: 03-26-16 per pt currently no but use to  . Sexual activity: Never  Lifestyle  . Physical activity:    Days per week: Not on file    Minutes per session: Not on file  . Stress: Not on file  Relationships  . Social connections:    Talks on phone: Not on file    Gets together: Not on file    Attends religious service: Not on file    Active member of club or organization: Not on file    Attends meetings of clubs or organizations: Not on file    Relationship status: Not on file  Other Topics Concern  . Not on file  Social History Narrative  . Not on file    Allergies:  Allergies  Allergen Reactions  . Lipitor [Atorvastatin] Shortness Of Breath    Metabolic Disorder Labs: Lab Results  Component Value Date   HGBA1C 6.2 04/10/2018   No results found for: PROLACTIN Lab Results  Component Value Date   CHOL 147 04/10/2018   TRIG 81 04/10/2018   HDL 58 04/10/2018   CHOLHDL 2.5 04/10/2018   LDLCALC 73 04/10/2018   LDLCALC 70 07/02/2017   Lab Results  Component Value Date   TSH 0.893 04/10/2018   TSH 2.270 12/03/2016    Therapeutic Level Labs: Lab Results  Component Value Date   LITHIUM 0.4 (L) 04/10/2018   LITHIUM 0.6 06/12/2017   No results found for: VALPROATE No components found for:  CBMZ  Current Medications: Current Outpatient Medications  Medication Sig Dispense Refill  . cetirizine (ZYRTEC) 10 MG tablet Take 1 tablet (10 mg total) by mouth  daily. 90 tablet 3  . cyclobenzaprine (FLEXERIL) 10 MG tablet Take 1 tablet (10 mg total) by mouth 3 (three) times daily as needed for muscle spasms. 270 tablet 3  . DULoxetine (CYMBALTA) 60 MG capsule Take 1 capsule (60 mg total) by mouth daily. 90 capsule 3  . fluticasone (  FLONASE) 50 MCG/ACT nasal spray Place 2 sprays into both nostrils daily. (Patient taking differently: Place 2 sprays into both nostrils as needed. ) 16 g 11  . lisinopril (PRINIVIL,ZESTRIL) 10 MG tablet Take 1 tablet (10 mg total) by mouth daily. 90 tablet 3  . lithium carbonate (ESKALITH) 450 MG CR tablet Take 2 tablets (900 mg total) by mouth at bedtime. 180 tablet 1  . meloxicam (MOBIC) 15 MG tablet Take 1 tablet (15 mg total) by mouth daily. 90 tablet 3  . mometasone (NASONEX) 50 MCG/ACT nasal spray Place 2 sprays into the nose daily. 17 g 5  . tamsulosin (FLOMAX) 0.4 MG CAPS capsule Take 1 capsule (0.4 mg total) by mouth daily. 90 capsule 3   No current facility-administered medications for this visit.      Musculoskeletal: Strength & Muscle Tone: within normal limits Gait & Station: normal Patient leans: N/A  Psychiatric Specialty Exam: Review of Systems  Genitourinary: Positive for dysuria.  All other systems reviewed and are negative.   Blood pressure 114/70, pulse 64, height '6\' 1"'  (1.854 m), weight 253 lb (114.8 kg), SpO2 97 %.Body mass index is 33.38 kg/m.  General Appearance: Casual, Neat and Well Groomed  Eye Contact:  Good  Speech:  Clear and Coherent  Volume:  Normal  Mood:  Euthymic  Affect:  Congruent  Thought Process:  Goal Directed  Orientation:  Full (Time, Place, and Person)  Thought Content: Rumination   Suicidal Thoughts:  No  Homicidal Thoughts:  No  Memory:  Immediate;   Good Recent;   Good Remote;   Good  Judgement:  Fair  Insight:  Fair  Psychomotor Activity:  Normal  Concentration:  Concentration: Good and Attention Span: Good  Recall:  Good  Fund of Knowledge: Good   Language: Good  Akathisia:  No  Handed:  Right  AIMS (if indicated): not done  Assets:  Communication Skills Desire for Improvement Physical Health Resilience Social Support Talents/Skills Vocational/Educational  ADL's:  Intact  Cognition: WNL  Sleep:  Good   Screenings: PHQ2-9     Office Visit from 04/10/2018 in [place] Visit from 09/10/2017 in [place] Visit from 01/31/2017 in [place] Office Visit from 01/14/2017 in [place] Visit from 12/03/2016 in [place]  PHQ-2 Total Score  0  0  0  0  0       Assessment and Plan: This patient is a 58 year old male with a history of bipolar disorder he is stable on his current regimen.  He will continue Cymbalta 60 mg daily for depression and lithium carbonate 900 mg at bedtime for mood stabilization.  He will check lithium level and other labs at his next visit.  He will return to see me in 3 months   Levonne Spiller, MD 08/27/2018, 10:36 AM

## 2018-08-28 ENCOUNTER — Encounter (HOSPITAL_COMMUNITY): Payer: Self-pay | Admitting: Licensed Clinical Social Worker

## 2018-08-28 ENCOUNTER — Ambulatory Visit (INDEPENDENT_AMBULATORY_CARE_PROVIDER_SITE_OTHER): Admitting: Licensed Clinical Social Worker

## 2018-08-28 DIAGNOSIS — F3162 Bipolar disorder, current episode mixed, moderate: Secondary | ICD-10-CM

## 2018-08-28 NOTE — Progress Notes (Signed)
   THERAPIST PROGRESS NOTE  Session Time: 10:00 am -10:30 am  Participation Level: Active  Behavioral Response: CasualAlertAnxious  Type of Therapy: Individual Therapy  Treatment Goals addressed: Coping  Interventions: CBT and Solution Focused  Summary: Adam Oneal is a 58 y.o. male who  presents oriented x5 (person, place, situation, time, and object), alert, average weight, average height, casually dressed, euthymic and cooperative to address mood and anxiety. Patient has a history of medical treatment including hypertension, degenerative disc disease and type 2 diabetes. Patient has a history of mental health treatment including medication management. Patient denies suicidal and homicidal ideations. Patient denies psychosis including auditory and visual hallucinations. Patient denies substance abuse. He is at low risk for lethality at this time.  Physically: Patient continues to exercise and take care of himself. He is sore from work.   Spiritually/values: No issues identified.   Relationships:Patient is getting along with his wife. He is concerned with her gambling and feels like he could save more money if he were by himself.  Emotional/Mental/Behavioral: Patient's mood is stable. He feels happy. Patient experiences some anxiety prior to work but uses meditation to help him deal with it.   Patient engaged in session. He responded well to interventions. Patient continues to meet criteria for Bipolar 1 disorder, mixed, moderate. Patient will continue in outpatient therapy due to being the least restrictive service to meet his needs at this time. Patient made moderate progress on his goals.   Suicidal/Homicidal: Negativewithout intent/plan  Therapist Response: Therapist reviewed patient's recent thoughts and behaviors. Therapist utilized CBT to address mood and anxiety. Therapist processed patient's feelings to identify triggers for mood. Therapist assisted patient in identifying  what is going well for him.   Plan: Return again in 4 weeks.   Diagnosis: Axis I: Bipolar, mixed    Axis II: No diagnosis    Bynum Bellows, LCSW 08/28/2018

## 2018-09-18 ENCOUNTER — Ambulatory Visit (INDEPENDENT_AMBULATORY_CARE_PROVIDER_SITE_OTHER): Admitting: Licensed Clinical Social Worker

## 2018-09-18 ENCOUNTER — Encounter (HOSPITAL_COMMUNITY): Payer: Self-pay | Admitting: Licensed Clinical Social Worker

## 2018-09-18 DIAGNOSIS — F3162 Bipolar disorder, current episode mixed, moderate: Secondary | ICD-10-CM

## 2018-09-18 NOTE — Progress Notes (Signed)
   THERAPIST PROGRESS NOTE  Session Time: 10:00 am -10:30 am  Participation Level: Active  Behavioral Response: CasualAlertAnxious  Type of Therapy: Individual Therapy  Treatment Goals addressed: Coping  Interventions: CBT and Solution Focused  Summary: Adam Oneal. Adam Oneal is a 58 y.o. male who  presents oriented x5 (person, place, situation, time, and object), alert, average weight, average height, casually dressed, euthymic and cooperative to address mood and anxiety. Patient has a history of medical treatment including hypertension, degenerative disc disease and type 2 diabetes. Patient has a history of mental health treatment including medication management. Patient denies suicidal and homicidal ideations. Patient denies psychosis including auditory and visual hallucinations. Patient denies substance abuse. He is at low risk for lethality at this time.  Physically: Patient is tired and sore. He doesn't feel like doing much. Patient has been gritting his teeth.  Spiritually/values: No issues identified.   Relationships:Patient is getting along with his wife. Patient noted that he doesn't see his wife much due to his work and her being gone gambling.   Emotional/Mental/Behavioral: Patient's mood is down. He was out of his medication for several days and it negatively impacted his mood and his physical health. Patient feels like he is getting "back" on his medicine. He feels like his mood will improve when it gets back into his system. Patient denies suicidal and homicidal ideation.   Patient engaged in session. He responded well to interventions. Patient continues to meet criteria for Bipolar 1 disorder, mixed, moderate. Patient will continue in outpatient therapy due to being the least restrictive service to meet his needs at this time. Patient made moderate progress on his goals.   Suicidal/Homicidal: Negativewithout intent/plan  Therapist Response: Therapist reviewed patient's recent  thoughts and behaviors. Therapist utilized CBT to address mood and anxiety. Therapist processed patient's feelings to identify triggers for mood. Therapist discussed patient's feelings of depression.   Plan: Return again in 4 weeks.   Diagnosis: Axis I: Bipolar, mixed    Axis II: No diagnosis    Adam Bellows, LCSW 09/18/2018

## 2018-10-09 ENCOUNTER — Ambulatory Visit: Admitting: Family Medicine

## 2018-10-16 ENCOUNTER — Encounter: Payer: Self-pay | Admitting: Family Medicine

## 2018-10-21 ENCOUNTER — Ambulatory Visit (HOSPITAL_COMMUNITY): Admitting: Licensed Clinical Social Worker

## 2018-11-20 ENCOUNTER — Encounter (HOSPITAL_COMMUNITY): Payer: Self-pay | Admitting: Licensed Clinical Social Worker

## 2018-11-20 ENCOUNTER — Other Ambulatory Visit: Payer: Self-pay

## 2018-11-20 ENCOUNTER — Ambulatory Visit (INDEPENDENT_AMBULATORY_CARE_PROVIDER_SITE_OTHER): Admitting: Licensed Clinical Social Worker

## 2018-11-20 DIAGNOSIS — F3162 Bipolar disorder, current episode mixed, moderate: Secondary | ICD-10-CM

## 2018-11-20 NOTE — Progress Notes (Signed)
Virtual Visit via Telephone Note  I connected with Adam Oneal on 11/20/18 at 10:00 AM EDT by telephone and verified that I am speaking with the correct person using two identifiers.   I discussed the limitations, risks, security and privacy concerns of performing an evaluation and management service by telephone and the availability of in person appointments. I also discussed with the patient that there may be a patient responsible charge related to this service. The patient expressed understanding and agreed to proceed.  Participation Level: Active  Behavioral Response: CasualAlertAnxious  Type of Therapy: Individual Therapy  Treatment Goals addressed: Coping  Interventions: CBT and Solution Focused  Summary: Adam Oneal is a 58 y.o. male who  presents oriented x5 (person, place, situation, time, and object), alert, average weight, average height, casually dressed, euthymic and cooperative to address mood and anxiety. Patient has a history of medical treatment including hypertension, degenerative disc disease and type 2 diabetes. Patient has a history of mental health treatment including medication management. Patient denies suicidal and homicidal ideations. Patient denies psychosis including auditory and visual hallucinations. Patient denies substance abuse. He is at low risk for lethality at this time.  Physically: Patient is having back pain. Patient is staying active and taking precautions to deal with the COVID19.  Spiritually/values: No issues identified.   Relationships:Patient is getting along with his wife. Patient is worried about getting the virus and spreading it to her.   Emotional/Mental/Behavioral: Patient's anxiety has increased. He is managing it but gets stressed about going to work due to [employer]. He has been going to work and taking precautions to keep himself safe and not get his wife sick who has health issues.   Patient engaged in session. He responded well  to interventions. Patient continues to meet criteria for Bipolar 1 disorder, mixed, moderate. Patient will continue in outpatient therapy due to being the least restrictive service to meet his needs at this time. Patient made moderate progress on his goals.   Suicidal/Homicidal: Negativewithout intent/plan  Therapist Response: Therapist reviewed patient's recent thoughts and behaviors. Therapist utilized CBT to address mood and anxiety. Therapist processed patient's feelings to identify triggers for mood. Therapist discussed patient's anxiety related to COVID.  Plan: Return again in 4 weeks.   Diagnosis: Axis I: Bipolar, mixed    Axis II: No diagnosis  I discussed the assessment and treatment plan with the patient. The patient was provided an opportunity to ask questions and all were answered. The patient agreed with the plan and demonstrated an understanding of the instructions.   The patient was advised to call back or seek an in-person evaluation if the symptoms worsen or if the condition fails to improve as anticipated.  I provided 30 minutes of non-face-to-face time during this encounter.  Bynum Bellows, LCSW 11/20/2018

## 2018-11-26 ENCOUNTER — Ambulatory Visit (HOSPITAL_COMMUNITY): Admitting: Psychiatry

## 2018-11-26 ENCOUNTER — Telehealth (HOSPITAL_COMMUNITY): Payer: Self-pay | Admitting: *Deleted

## 2018-11-26 ENCOUNTER — Other Ambulatory Visit: Payer: Self-pay

## 2018-11-26 ENCOUNTER — Other Ambulatory Visit (HOSPITAL_COMMUNITY): Payer: Self-pay | Admitting: Psychiatry

## 2018-11-26 MED ORDER — DULOXETINE HCL 60 MG PO CPEP: 60.0000 mg | ORAL_CAPSULE | Freq: Every day | ORAL | 3 refills | Status: DC

## 2018-11-26 MED ORDER — LITHIUM CARBONATE ER 450 MG PO TBCR: 900.0000 mg | EXTENDED_RELEASE_TABLET | Freq: Every day | ORAL | 1 refills | Status: DC

## 2018-11-26 NOTE — Telephone Encounter (Signed)
sent 

## 2018-11-26 NOTE — Telephone Encounter (Signed)
Dr Adam Oneal Patient was transferred to my phone after having trouble connecting with you for his appointment. Lupita Leash rescheduled for 12-31-2018. Patient requested refills on meds

## 2018-12-10 ENCOUNTER — Other Ambulatory Visit (HOSPITAL_COMMUNITY): Payer: Self-pay | Admitting: Psychiatry

## 2018-12-10 ENCOUNTER — Telehealth (HOSPITAL_COMMUNITY): Payer: Self-pay | Admitting: *Deleted

## 2018-12-10 MED ORDER — LITHIUM CARBONATE ER 450 MG PO TBCR: 900.0000 mg | EXTENDED_RELEASE_TABLET | Freq: Every day | ORAL | 1 refills | Status: DC

## 2018-12-10 NOTE — Telephone Encounter (Signed)
sent 

## 2018-12-10 NOTE — Telephone Encounter (Signed)
Dr Tenny Craw Patient's wife called the patient hasn't had his medication Lithium for 2 days due to [facility] had a hold on it  & are just now processing it. They asked if they could please have a  14 day supply sent to the [facility] in [place]

## 2018-12-10 NOTE — Telephone Encounter (Signed)
yes

## 2018-12-10 NOTE — Telephone Encounter (Signed)
Dr Tenny Craw APtient's wife(JOY) called back to ask if the 14 day supply went to [facility] in [place]?

## 2018-12-31 ENCOUNTER — Other Ambulatory Visit: Payer: Self-pay

## 2018-12-31 ENCOUNTER — Ambulatory Visit (HOSPITAL_COMMUNITY): Admitting: Psychiatry

## 2019-01-22 ENCOUNTER — Other Ambulatory Visit (HOSPITAL_COMMUNITY): Payer: Self-pay | Admitting: Psychiatry

## 2019-02-03 ENCOUNTER — Other Ambulatory Visit: Payer: Self-pay

## 2019-02-03 ENCOUNTER — Ambulatory Visit (INDEPENDENT_AMBULATORY_CARE_PROVIDER_SITE_OTHER): Admitting: Psychiatry

## 2019-02-03 ENCOUNTER — Encounter (HOSPITAL_COMMUNITY): Payer: Self-pay | Admitting: Psychiatry

## 2019-02-03 DIAGNOSIS — F3162 Bipolar disorder, current episode mixed, moderate: Secondary | ICD-10-CM | POA: Diagnosis not present

## 2019-02-03 MED ORDER — DULOXETINE HCL 60 MG PO CPEP: 60.0000 mg | ORAL_CAPSULE | Freq: Every day | ORAL | 3 refills | Status: DC

## 2019-02-03 MED ORDER — LITHIUM CARBONATE ER 450 MG PO TBCR: 900.0000 mg | EXTENDED_RELEASE_TABLET | Freq: Every day | ORAL | 3 refills | Status: DC

## 2019-02-03 NOTE — Progress Notes (Signed)
Virtual Visit via Telephone Note  I connected with Adam Oneal on 02/03/19 at 11:00 AM EDT by telephone and verified that I am speaking with the correct person using two identifiers.   I discussed the limitations, risks, security and privacy concerns of performing an evaluation and management service by telephone and the availability of in person appointments. I also discussed with the patient that there may be a patient responsible charge related to this service. The patient expressed understanding and agreed to proceed.      I discussed the assessment and treatment plan with the patient. The patient was provided an opportunity to ask questions and all were answered. The patient agreed with the plan and demonstrated an understanding of the instructions.   The patient was advised to call back or seek an in-person evaluation if the symptoms worsen or if the condition fails to improve as anticipated.  I provided 15 minutes of non-face-to-face time during this encounter.   Levonne Spiller, MD  [facility] MD/PA/NP OP Progress Note  02/03/2019 11:39 AM Adam Oneal  MRN:  [id number]  Chief Complaint:  Chief Complaint    Depression; Manic Behavior; Anxiety; Follow-up     HPI: This patient is a 58 year old married white male who lives with his wife in [place]. 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 The couple have no children   The patient was referred by his primary care provider from [facility] for further assessment and treatment of bipolar disorder.  The patient is somewhat of a vague historian and isn't quite sure how he got diagnosed with bipolar disorder. He states that he has had "mood swings" all his life. He went into the [employer] at 58 years of age and got out 26 years later. He states that his mood was up and down throughout the whole time but he dealt with that by drinking quite a bit of alcohol as well as caffeine during the day.  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 His moods are "up and down" he states that at times he does have symptoms of overt mania such as inability to sleep and hypersexuality.  In the fall and winter he becomes more depressed and tired with low energy and motivation. He has been on lithium for a number of years in his current level is 0.6. Since he moved here he has had to work the night shift for the first time in his life. He has difficulty sleeping during the day and also doesn't remember to take off his medications. He states that he tends to obsess and wants his house spotlessly clean. At times he rants and raves about situations at work but would never really hurt anybody or himself. He's never had psychiatric inpatient treatment. His wife describes him as "very anxious" and unhappy with his job. He calls in sick about once a month. He has no friends and doesn't really trust other people. 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  He has missed some appointments.  He states that overall however he is doing okay.  He does not always feel that safe in his workplace because of the coronavirus and people not always keeping the mass on.  He is wearing a mask most  the time but sometimes he feels suffocated and takes it off for short time.  Work still makes him anxious but he is going to try to hang in there for the next 2 years so he can get his full retirement.  He denies any manic or depressive symptoms he is sleeping well.  He feels like the lithium has helped his mood become more stable and he is no longer depressed or suicidal. Visit Diagnosis:    ICD-10-CM   1. Bipolar 1 disorder, mixed, moderate (HCC)  F31.62 Lithium level    TSH    Basic Metabolic Panel (BMET)    Past Psychiatric History: Long-term outpatient treatment  for bipolar disorder  Past Medical History:  Past Medical History:  Diagnosis Date  . Acid reflux   . Anxiety 01/1998  . Cataract 02/2004  . Depression 01/1998  . Diabetes mellitus without complication ([place]) 54/6270  . Diabetes mellitus, type II ([place])   . Hyperlipidemia 11/2006  . Hypertension   . Hypertension    History reviewed. No pertinent surgical history.  Family Psychiatric History: See below  Family History:  Family History  Problem Relation Age of Onset  . Cancer Mother 39       lung  . Heart disease Father   . Hyperlipidemia Father   . Diabetes Brother   . Leukemia Brother   . Diabetes Paternal Grandmother   . Heart disease Paternal Grandmother   . Alcohol abuse Paternal Grandfather   . Diabetes Brother   . Depression Maternal Aunt     Social History:  Social History   Socioeconomic History  . Marital status: Married    Spouse name: Not on file  . Number of children: Not on file  . Years of education: Not on file  . Highest education level: Not on file  Occupational History  . Not on file  Social Needs  . Financial resource strain: Not on file  . Food insecurity    Worry: Not on file    Inability: Not on file  . Transportation needs    Medical: Not on file    Non-medical: Not on file  Tobacco Use  . Smoking status: Former Smoker    Packs/day: 1.00    Years: 12.00    Pack years: 12.00    Types: Cigarettes    Start date: 01/05/1978    Quit date: 12/28/1989    Years since quitting: 29.1  . Smokeless tobacco: Former Network engineer and Sexual Activity  . Alcohol use: No    Alcohol/week: 0.0 standard drinks    Comment: 03-26-16 per pt no  . Drug use: No    Comment: 03-26-16 per pt currently no but use to  . Sexual activity: Never  Lifestyle  . Physical activity    Days per week: Not on file    Minutes per session: Not on file  . Stress: Not on file  Relationships  . Social Herbalist on phone: Not on file    Gets together: Not on  file    Attends religious service: Not on file    Active member of club or organization: Not on file    Attends meetings of clubs or organizations: Not on file    Relationship status: Not on file  Other Topics Concern  . Not on file  Social History Narrative  . Not on file    Allergies:  Allergies  Allergen Reactions  . Lipitor [Atorvastatin] Shortness Of Breath  Metabolic Disorder Labs: Lab Results  Component Value Date   HGBA1C 6.2 04/10/2018   No results found for: PROLACTIN Lab Results  Component Value Date   CHOL 147 04/10/2018   TRIG 81 04/10/2018   HDL 58 04/10/2018   CHOLHDL 2.5 04/10/2018   LDLCALC 73 04/10/2018   LDLCALC 70 07/02/2017   Lab Results  Component Value Date   TSH 0.893 04/10/2018   TSH 2.270 12/03/2016    Therapeutic Level Labs: Lab Results  Component Value Date   LITHIUM 0.4 (L) 04/10/2018   LITHIUM 0.6 06/12/2017   No results found for: VALPROATE No components found for:  CBMZ  Current Medications: Current Outpatient Medications  Medication Sig Dispense Refill  . cetirizine (ZYRTEC) 10 MG tablet Take 1 tablet (10 mg total) by mouth daily. 90 tablet 3  . cyclobenzaprine (FLEXERIL) 10 MG tablet Take 1 tablet (10 mg total) by mouth 3 (three) times daily as needed for muscle spasms. 270 tablet 3  . DULoxetine (CYMBALTA) 60 MG capsule Take 1 capsule (60 mg total) by mouth daily. 90 capsule 3  . fluticasone (FLONASE) 50 MCG/ACT nasal spray Place 2 sprays into both nostrils daily. (Patient taking differently: Place 2 sprays into both nostrils as needed. ) 16 g 11  . lisinopril (PRINIVIL,ZESTRIL) 10 MG tablet Take 1 tablet (10 mg total) by mouth daily. 90 tablet 3  . lithium carbonate (ESKALITH) 450 MG CR tablet Take 2 tablets (900 mg total) by mouth at bedtime. 30 tablet 3  . meloxicam (MOBIC) 15 MG tablet Take 1 tablet (15 mg total) by mouth daily. 90 tablet 3  . mometasone (NASONEX) 50 MCG/ACT nasal spray Place 2 sprays into the nose  daily. 17 g 5  . tamsulosin (FLOMAX) 0.4 MG CAPS capsule Take 1 capsule (0.4 mg total) by mouth daily. 90 capsule 3   No current facility-administered medications for this visit.      Musculoskeletal: Strength & Muscle Tone: within normal limits Gait & Station: normal Patient leans: N/A  Psychiatric Specialty Exam: Review of Systems  Psychiatric/Behavioral: The patient is nervous/anxious.   All other systems reviewed and are negative.   There were no vitals taken for this visit.There is no height or weight on file to calculate BMI.  General Appearance: NA  Eye Contact:  NA  Speech:  Clear and Coherent  Volume:  Normal  Mood:  Anxious  Affect:  Congruent  Thought Process:  Goal Directed  Orientation:  Full (Time, Place, and Person)  Thought Content: WDL and Logical   Suicidal Thoughts:  No  Homicidal Thoughts:  No  Memory:  Immediate;   Good Recent;   Good Remote;   Fair  Judgement:  Fair  Insight:  Fair  Psychomotor Activity:  Normal  Concentration:  Concentration: Good and Attention Span: Good  Recall:  [place] of Knowledge: Fair  Language: Good  Akathisia:  No  Handed:  Right  AIMS (if indicated): not done  Assets:  Communication Skills Desire for Improvement Physical Health Resilience Social Support Talents/Skills  ADL's:  Intact  Cognition: WNL  Sleep:  Good   Screenings: PHQ2-9     Office Visit from 04/10/2018 in [facility] Visit from 09/10/2017 in [facility] Office Visit from 01/31/2017 in [facility] Office Visit from 01/14/2017 in [facility] Visit from 12/03/2016 in [facility]  PHQ-2 Total Score  0  0  0  0  0  Assessment and Plan: This patient is a 58 year old male with a history of bipolar disorder.  He tends to do pretty well as long as he stays on his medication.  He will continue lithium carbonate 900 mg daily as  well as Cymbalta 60 mg daily.  He will check a lithium level TSH and basic metabolic panel.  He will return to see me in 4 months or call sooner if needed   Levonne Spiller, MD 02/03/2019, 11:39 AM

## 2019-02-09 ENCOUNTER — Encounter: Payer: Self-pay | Admitting: Family Medicine

## 2019-02-09 ENCOUNTER — Other Ambulatory Visit (HOSPITAL_COMMUNITY): Payer: Self-pay | Admitting: Psychiatry

## 2019-02-09 ENCOUNTER — Telehealth: Payer: Self-pay | Admitting: *Deleted

## 2019-02-09 ENCOUNTER — Other Ambulatory Visit: Payer: Self-pay

## 2019-02-09 ENCOUNTER — Other Ambulatory Visit

## 2019-02-09 ENCOUNTER — Telehealth (HOSPITAL_COMMUNITY): Payer: Self-pay

## 2019-02-09 ENCOUNTER — Ambulatory Visit (INDEPENDENT_AMBULATORY_CARE_PROVIDER_SITE_OTHER): Admitting: Family Medicine

## 2019-02-09 DIAGNOSIS — R5383 Other fatigue: Secondary | ICD-10-CM | POA: Diagnosis not present

## 2019-02-09 DIAGNOSIS — R42 Dizziness and giddiness: Secondary | ICD-10-CM | POA: Diagnosis not present

## 2019-02-09 DIAGNOSIS — R509 Fever, unspecified: Secondary | ICD-10-CM

## 2019-02-09 DIAGNOSIS — Z20822 Contact with and (suspected) exposure to covid-19: Secondary | ICD-10-CM

## 2019-02-09 MED ORDER — DULOXETINE HCL 60 MG PO CPEP: 60.0000 mg | ORAL_CAPSULE | Freq: Every day | ORAL | 0 refills | Status: DC

## 2019-02-09 NOTE — Telephone Encounter (Signed)
sent 

## 2019-02-09 NOTE — Telephone Encounter (Signed)
Medication management - Telephone call with pt after he left a message he was out of Cymbalta, has been now for about 1 week and order Dr. Harrington Challenger sent to [facility] will not be in until 02/12/19.  Pt requests a week supply be sent to his [facility] in [place].  Agreed to send request to Dr. Harrington Challenger and will call back if any problems with request.

## 2019-02-09 NOTE — Telephone Encounter (Signed)
Other Fatigue (Primary)  Dizziness; Feels feverish   Call to patient- patient has been scheduled and orders placed for testing

## 2019-02-09 NOTE — Telephone Encounter (Signed)
Medication management - Telephone call back to patient to inform Dr. Harrington Challenger sent in a new 30 day order of his requested Cymbalta to his requested [facility].

## 2019-02-09 NOTE — Telephone Encounter (Signed)
-----   Message from Worthy Rancher, MD sent at 02/09/2019 12:37 PM EDT ----- Covid testing

## 2019-02-09 NOTE — Progress Notes (Signed)
Virtual Visit via telephone Note  I connected with Adam Oneal. [place] on 02/09/19 at 1224 by telephone and verified that I am speaking with the correct person using two identifiers. Adam Oneal. Adam Oneal is currently located at home and no other people are currently with her during visit. The provider, Fransisca Kaufmann Dettinger, MD is located in their office at time of visit.  Call ended at 1234  I discussed the limitations, risks, security and privacy concerns of performing an evaluation and management service by telephone and the availability of in person appointments. I also discussed with the patient that there may be a patient responsible charge related to this service. The patient expressed understanding and agreed to proceed.   History and Present Illness:  patient has been having fatigue and feeling tired and gets sweaty easily.  He has been having this for 5 days. He denies any cough and runny nose or fevers or chills.  He denies any symptoms or covid exposure.  He is wearing a mask at work.  He ran out of his Cymbalta 1 week ago symptoms started.  He does also feel like an agitation anxiety and easily irritable.  No diagnosis found.  Outpatient Encounter Medications as of 02/09/2019  Medication Sig  . cetirizine (ZYRTEC) 10 MG tablet Take 1 tablet (10 mg total) by mouth daily.  . cyclobenzaprine (FLEXERIL) 10 MG tablet Take 1 tablet (10 mg total) by mouth 3 (three) times daily as needed for muscle spasms.  . DULoxetine (CYMBALTA) 60 MG capsule Take 1 capsule (60 mg total) by mouth daily.  . fluticasone (FLONASE) 50 MCG/ACT nasal spray Place 2 sprays into both nostrils daily. (Patient taking differently: Place 2 sprays into both nostrils as needed. )  . lisinopril (PRINIVIL,ZESTRIL) 10 MG tablet Take 1 tablet (10 mg total) by mouth daily.  Marland Kitchen lithium carbonate (ESKALITH) 450 MG CR tablet Take 2 tablets (900 mg total) by mouth at bedtime.  . meloxicam (MOBIC) 15 MG tablet Take 1 tablet (15 mg  total) by mouth daily.  . mometasone (NASONEX) 50 MCG/ACT nasal spray Place 2 sprays into the nose daily.  . tamsulosin (FLOMAX) 0.4 MG CAPS capsule Take 1 capsule (0.4 mg total) by mouth daily.   No facility-administered encounter medications on file as of 02/09/2019.     Review of Systems  Constitutional: Positive for fatigue. Negative for chills and fever.  HENT: Negative for congestion, rhinorrhea, sinus pressure, sinus pain and sneezing.   Eyes: Negative for visual disturbance.  Respiratory: Negative for cough, shortness of breath and wheezing.   Cardiovascular: Negative for chest pain and leg swelling.  Endocrine: Positive for heat intolerance.  Musculoskeletal: Negative for back pain and gait problem.  Skin: Negative for rash.  Neurological: Positive for dizziness and weakness. Negative for light-headedness and numbness.  Psychiatric/Behavioral: Positive for agitation, behavioral problems and dysphoric mood. The patient is nervous/anxious.   All other systems reviewed and are negative.   Observations/Objective: Patient sounds comfortable and in no acute distress  Assessment and Plan: Problem List Items Addressed This Visit    None    Visit Diagnoses    Other fatigue    -  Primary   Dizziness       Feels feverish           Follow Up Instructions: Restart cymbalta, likely all of his symptoms are related to stopping the Cymbalta based on what he saying.  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 The patient was provided an opportunity to ask questions and all were answered. The patient agreed with the plan and demonstrated an understanding of the instructions.   The patient was advised to call back or seek an in-person evaluation if the symptoms worsen or if the condition fails to improve as anticipated.  The above assessment and  management plan was discussed with the patient. The patient verbalized understanding of and has agreed to the management plan. Patient is aware to call the clinic if symptoms persist or worsen. Patient is aware when to return to the clinic for a follow-up visit. Patient educated on when it is appropriate to go to the emergency department.    I provided 10 minutes of non-face-to-face time during this encounter.    Adam PyleJoshua A Dettinger, MD

## 2019-02-10 LAB — BASIC METABOLIC PANEL
BUN: 14 mg/dL (ref 7–25)
CO2: 21 mmol/L (ref 20–32)
Calcium: 9.4 mg/dL (ref 8.6–10.3)
Chloride: 107 mmol/L (ref 98–110)
Creat: 0.85 mg/dL (ref 0.70–1.33)
Glucose, Bld: 247 mg/dL — ABNORMAL HIGH (ref 65–139)
Potassium: 4.1 mmol/L (ref 3.5–5.3)
Sodium: 137 mmol/L (ref 135–146)

## 2019-02-10 LAB — LITHIUM LEVEL: Lithium Lvl: 0.3 mmol/L — ABNORMAL LOW (ref 0.6–1.2)

## 2019-02-10 LAB — TSH: TSH: 0.96 mIU/L (ref 0.40–4.50)

## 2019-02-13 LAB — NOVEL CORONAVIRUS, NAA: SARS-CoV-2, NAA: NOT DETECTED

## 2019-04-15 ENCOUNTER — Other Ambulatory Visit: Payer: Self-pay

## 2019-04-15 ENCOUNTER — Ambulatory Visit (INDEPENDENT_AMBULATORY_CARE_PROVIDER_SITE_OTHER): Admitting: Psychiatry

## 2019-04-15 ENCOUNTER — Encounter (HOSPITAL_COMMUNITY): Payer: Self-pay | Admitting: Psychiatry

## 2019-04-15 DIAGNOSIS — F3162 Bipolar disorder, current episode mixed, moderate: Secondary | ICD-10-CM | POA: Diagnosis not present

## 2019-04-15 MED ORDER — RISPERIDONE 0.5 MG PO TABS: 0.5000 mg | ORAL_TABLET | Freq: Two times a day (BID) | ORAL | 2 refills | Status: DC

## 2019-04-15 MED ORDER — DULOXETINE HCL 60 MG PO CPEP: 60.0000 mg | ORAL_CAPSULE | Freq: Every day | ORAL | 0 refills | Status: DC

## 2019-04-15 MED ORDER — LITHIUM CARBONATE ER 450 MG PO TBCR: 900.0000 mg | EXTENDED_RELEASE_TABLET | Freq: Every day | ORAL | 3 refills | Status: DC

## 2019-04-15 NOTE — Progress Notes (Signed)
Virtual Visit via Telephone Note  I connected with Adam Oneal on 04/15/19 at  9:20 AM EDT by telephone and verified that I am speaking with the correct person using two identifiers.   I discussed the limitations, risks, security and privacy concerns of performing an evaluation and management service by telephone and the availability of in person appointments. I also discussed with the patient that there may be a patient responsible charge related to this service. The patient expressed understanding and agreed to proceed.      I discussed the assessment and treatment plan with the patient. The patient was provided an opportunity to ask questions and all were answered. The patient agreed with the plan and demonstrated an understanding of the instructions.   The patient was advised to call back or seek an in-person evaluation if the symptoms worsen or if the condition fails to improve as anticipated.  I provided 15 minutes of non-face-to-face time during this encounter.   Levonne Spiller, MD  [facility] MD/PA/NP OP Progress Note  04/15/2019 9:45 AM Adam Oneal  MRN:  [id number]  Chief Complaint:  Chief Complaint    Depression; Anxiety; Manic Behavior; Follow-up     HPI: This patient is a 58 year old married white male who lives with his wife in [place]. 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 The couple have no children   The patient was referred by his primary care provider from [facility] for further assessment and treatment of bipolar disorder.  The patient is somewhat of a vague historian and isn't quite sure how he got diagnosed with bipolar disorder. He states that he has had "mood swings" all his life. He went into the [employer] at 58 years of age and got out 70 years later. He states that his mood was up and down throughout the whole time but he dealt with that by drinking quite a bit of alcohol as well as caffeine during the day.  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 His moods are "up and down" he states that at times he does have symptoms of overt mania such as inability to sleep and hypersexuality.  In the fall and winter he becomes more depressed and tired with low energy and motivation. He has been on lithium for a number of years in his current level is 0.6. Since he moved here he has had to work the night shift for the first time in his life. He has difficulty sleeping during the day and also doesn't remember to take off his medications. He states that he tends to obsess and wants his house spotlessly clean. At times he rants and raves about situations at work but would never really hurt anybody or himself. He's never had psychiatric inpatient treatment. His wife describes him as "very anxious" and unhappy with his job. He calls in sick about once a month. He has no friends and doesn't really trust other people. 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  He states he has had a hard time going to work.  He states that his coworkers have been pleasant and is supervisor has also been very supportive but yet he feels paranoid at work and thinks that people are talking about him.  He  is also worried about catching COVID.  He is already had one COVID test which was negative.  He does not like wearing a mask but knows that he has to.  He has missed a lot of work because he gets extremely anxious on the way.  He is thinking about trying to get out on disability.  Because of the increased paranoia I suggested that we add a low-dose of antipsychotic such as risperdal and he agrees.  His blood sugar when he last had a basic metabolic panel was high at 247 so I suggested he contact his PCP about this.  His lithium level was a bit low at 0.3.  He claims he is compliant  with medication but does not always get it on time from his mail order pharmacy so we will switch to [facility].  He denies any thoughts of self-harm or suicide. Visit Diagnosis:    ICD-10-CM   1. Bipolar 1 disorder, mixed, moderate (HCC)  F31.62     Past Psychiatric History: Long-term outpatient treatment for bipolar disorder  Past Medical History:  Past Medical History:  Diagnosis Date  . Acid reflux   . Anxiety 01/1998  . Cataract 02/2004  . Depression 01/1998  . Diabetes mellitus without complication ([place]) 28/0034  . Diabetes mellitus, type II ([place])   . Hyperlipidemia 11/2006  . Hypertension   . Hypertension    History reviewed. No pertinent surgical history.  Family Psychiatric History: see below  Family History:  Family History  Problem Relation Age of Onset  . Cancer Mother 21       lung  . Heart disease Father   . Hyperlipidemia Father   . Diabetes Brother   . Leukemia Brother   . Diabetes Paternal Grandmother   . Heart disease Paternal Grandmother   . Alcohol abuse Paternal Grandfather   . Diabetes Brother   . Depression Maternal Aunt     Social History:  Social History   Socioeconomic History  . Marital status: Married    Spouse name: Not on file  . Number of children: Not on file  . Years of education: Not on file  . Highest education level: Not on file  Occupational History  . Not on file  Social Needs  . Financial resource strain: Not on file  . Food insecurity    Worry: Not on file    Inability: Not on file  . Transportation needs    Medical: Not on file    Non-medical: Not on file  Tobacco Use  . Smoking status: Former Smoker    Packs/day: 1.00    Years: 12.00    Pack years: 12.00    Types: Cigarettes    Start date: 01/05/1978    Quit date: 12/28/1989    Years since quitting: 29.3  . Smokeless tobacco: Former Network engineer and Sexual Activity  . Alcohol use: No    Alcohol/week: 0.0 standard drinks    Comment: 03-26-16 per pt no  . Drug  use: No    Comment: 03-26-16 per pt currently no but use to  . Sexual activity: Never  Lifestyle  . Physical activity    Days per week: Not on file    Minutes per session: Not on file  . Stress: Not on file  Relationships  . Social Herbalist on phone: Not on file    Gets together: Not on file    Attends religious service: Not on file    Active  member of club or organization: Not on file    Attends meetings of clubs or organizations: Not on file    Relationship status: Not on file  Other Topics Concern  . Not on file  Social History Narrative  . Not on file    Allergies:  Allergies  Allergen Reactions  . Lipitor [Atorvastatin] Shortness Of Breath    Metabolic Disorder Labs: Lab Results  Component Value Date   HGBA1C 6.2 04/10/2018   No results found for: PROLACTIN Lab Results  Component Value Date   CHOL 147 04/10/2018   TRIG 81 04/10/2018   HDL 58 04/10/2018   CHOLHDL 2.5 04/10/2018   LDLCALC 73 04/10/2018   LDLCALC 70 07/02/2017   Lab Results  Component Value Date   TSH 0.96 02/09/2019   TSH 0.893 04/10/2018    Therapeutic Level Labs: Lab Results  Component Value Date   LITHIUM 0.3 (L) 02/09/2019   LITHIUM 0.4 (L) 04/10/2018   No results found for: VALPROATE No components found for:  CBMZ  Current Medications: Current Outpatient Medications  Medication Sig Dispense Refill  . cetirizine (ZYRTEC) 10 MG tablet Take 1 tablet (10 mg total) by mouth daily. 90 tablet 3  . cyclobenzaprine (FLEXERIL) 10 MG tablet Take 1 tablet (10 mg total) by mouth 3 (three) times daily as needed for muscle spasms. 270 tablet 3  . DULoxetine (CYMBALTA) 60 MG capsule Take 1 capsule (60 mg total) by mouth daily. 30 capsule 0  . fluticasone (FLONASE) 50 MCG/ACT nasal spray Place 2 sprays into both nostrils daily. (Patient taking differently: Place 2 sprays into both nostrils as needed. ) 16 g 11  . lisinopril (PRINIVIL,ZESTRIL) 10 MG tablet Take 1 tablet (10 mg total)  by mouth daily. 90 tablet 3  . lithium carbonate (ESKALITH) 450 MG CR tablet Take 2 tablets (900 mg total) by mouth at bedtime. 30 tablet 3  . meloxicam (MOBIC) 15 MG tablet Take 1 tablet (15 mg total) by mouth daily. 90 tablet 3  . mometasone (NASONEX) 50 MCG/ACT nasal spray Place 2 sprays into the nose daily. 17 g 5  . risperiDONE (RISPERDAL) 0.5 MG tablet Take 1 tablet (0.5 mg total) by mouth 2 (two) times daily. 60 tablet 2  . tamsulosin (FLOMAX) 0.4 MG CAPS capsule Take 1 capsule (0.4 mg total) by mouth daily. 90 capsule 3   No current facility-administered medications for this visit.      Musculoskeletal: Strength & Muscle Tone: within normal limits Gait & Station: normal Patient leans: N/A  Psychiatric Specialty Exam: Review of Systems  Constitutional: Positive for malaise/fatigue.  Neurological: Positive for tremors.  Psychiatric/Behavioral: The patient is nervous/anxious.   All other systems reviewed and are negative.   There were no vitals taken for this visit.There is no height or weight on file to calculate BMI.  General Appearance: NA  Eye Contact:  NA  Speech:  Clear and Coherent  Volume:  Normal  Mood:  Anxious  Affect:  NA  Thought Process:  Goal Directed  Orientation:  Full (Time, Place, and Person)  Thought Content: Paranoid Ideation and Rumination   Suicidal Thoughts:  No  Homicidal Thoughts:  No  Memory:  Immediate;   Good Recent;   Good Remote;   Fair  Judgement:  Fair  Insight:  Fair  Psychomotor Activity:  Decreased  Concentration:  Concentration: Fair and Attention Span: Fair  Recall:  Good  Fund of Knowledge: Fair  Language: Good  Akathisia:  No  Handed:  Right  AIMS (if indicated): not done  Assets:  Communication Skills Desire for Improvement Resilience Social Support  ADL's:  Intact  Cognition: WNL  Sleep:  Good   Screenings: PHQ2-9     Office Visit from 04/10/2018 in [facility] Visit from 09/10/2017 in  [facility] Visit from 01/31/2017 in [facility] Office Visit from 01/14/2017 in [facility] Office Visit from 12/03/2016 in [facility]  PHQ-2 Total Score  0  0  0  0  0       Assessment and Plan: This patient is a 58 year old male with a history of bipolar disorder.  He seems to be getting more paranoid.  We will add Risperdal 0.5 mg twice daily for the paranoid symptoms, continue lithium carbonate 900 mg daily as well as Cymbalta 60 mg daily.  He will return to see me in 4 weeks   Levonne Spiller, MD 04/15/2019, 9:45 AM

## 2019-04-20 ENCOUNTER — Telehealth: Payer: Self-pay | Admitting: Family Medicine

## 2019-04-20 NOTE — Telephone Encounter (Signed)
Appointment scheduled 04/21/2019 with Chevis Pretty.

## 2019-04-21 ENCOUNTER — Ambulatory Visit (INDEPENDENT_AMBULATORY_CARE_PROVIDER_SITE_OTHER): Admitting: Nurse Practitioner

## 2019-04-21 ENCOUNTER — Other Ambulatory Visit: Payer: Self-pay

## 2019-04-21 ENCOUNTER — Encounter: Payer: Self-pay | Admitting: Nurse Practitioner

## 2019-04-21 VITALS — BP 128/82 | HR 61 | Temp 97.3°F | Ht 73.0 in | Wt 244.0 lb

## 2019-04-21 DIAGNOSIS — M25562 Pain in left knee: Secondary | ICD-10-CM

## 2019-04-21 MED ORDER — PREDNISONE 10 MG (21) PO TBPK: ORAL_TABLET | ORAL | 0 refills | Status: DC

## 2019-04-21 NOTE — Patient Instructions (Signed)

## 2019-04-21 NOTE — Progress Notes (Signed)
   Subjective:    Patient ID: Adam Oneal, male    DOB: March 15, 1961, 58 y.o.   MRN: [id number]   Chief Complaint: Knee Pain   HPI Patient comes in today c/o left knee pain. He has had this problem a few years ago and got better, except for occasional intermittent pain. Last few weeks he got a new job and is on his feet a lot and knee has started hurting. Rate [name] 1/10 right now. Worsens when standing or walking. Ice helps   Review of Systems     Objective:   Physical Exam Vitals signs and nursing note reviewed.  Constitutional:      Appearance: Normal appearance. He is well-developed.  HENT:     Head: Normocephalic.     Nose: Nose normal.  Eyes:     Pupils: Pupils are equal, round, and reactive to light.  Neck:     Musculoskeletal: Normal range of motion and neck supple.     Thyroid: No thyroid mass or thyromegaly.     Vascular: No carotid bruit or JVD.     Trachea: Phonation normal.  Cardiovascular:     Rate and Rhythm: Normal rate and regular rhythm.  Pulmonary:     Effort: Pulmonary effort is normal. No respiratory distress.     Breath sounds: Normal breath sounds.  Abdominal:     General: Bowel sounds are normal.     Palpations: Abdomen is soft.     Tenderness: There is no abdominal tenderness.  Musculoskeletal: Normal range of motion.     Comments: No patella tenderness' mild left knee effusion All ligaments intact  Lymphadenopathy:     Cervical: No cervical adenopathy.  Skin:    General: Skin is warm and dry.  Neurological:     Mental Status: He is alert and oriented to person, place, and time.  Psychiatric:        Behavior: Behavior normal.        Thought Content: Thought content normal.        Judgment: Judgment normal.    BP 128/82   Pulse 61   Temp (!) 97.3 F (36.3 C) (Oral)   Ht 6\' 1"  (1.854 m)   Wt 244 lb (110.7 kg)   SpO2 97%   BMI 32.19 kg/m         Assessment & Plan:  Adam Oneal in today with chief complaint of Knee Pain   1. Acute pain of left knee Rest Ice bid Compression wrap when up walking Elevate when sitting If no better in 1 week will do ortho referral - predniSONE (STERAPRED UNI-PAK 21 TAB) 10 MG (21) TBPK tablet; As directed x 6 days  Dispense: 21 tablet; Refill: 0  Mary-Margaret Hassell Done, FNP

## 2019-05-01 ENCOUNTER — Other Ambulatory Visit: Payer: Self-pay | Admitting: Family Medicine

## 2019-05-01 DIAGNOSIS — I10 Essential (primary) hypertension: Secondary | ICD-10-CM

## 2019-05-07 ENCOUNTER — Encounter: Payer: Self-pay | Admitting: Family

## 2019-05-07 ENCOUNTER — Other Ambulatory Visit: Payer: Self-pay

## 2019-05-07 ENCOUNTER — Telehealth: Payer: Self-pay | Admitting: Family Medicine

## 2019-05-07 ENCOUNTER — Ambulatory Visit (INDEPENDENT_AMBULATORY_CARE_PROVIDER_SITE_OTHER)

## 2019-05-07 ENCOUNTER — Ambulatory Visit (INDEPENDENT_AMBULATORY_CARE_PROVIDER_SITE_OTHER): Admitting: Family

## 2019-05-07 VITALS — BP 139/84 | HR 67 | Temp 98.0°F | Ht 72.0 in | Wt 241.0 lb

## 2019-05-07 DIAGNOSIS — M25562 Pain in left knee: Secondary | ICD-10-CM | POA: Diagnosis not present

## 2019-05-07 DIAGNOSIS — Z23 Encounter for immunization: Secondary | ICD-10-CM

## 2019-05-07 MED ORDER — DICLOFENAC SODIUM 75 MG PO TBEC: 75.0000 mg | DELAYED_RELEASE_TABLET | Freq: Two times a day (BID) | ORAL | 0 refills | Status: DC

## 2019-05-07 MED ORDER — PREDNISONE 10 MG (21) PO TBPK: ORAL_TABLET | ORAL | 0 refills | Status: DC

## 2019-05-07 NOTE — Progress Notes (Signed)
Subjective:    Patient ID: Adam Oneal, male    DOB: 1960/10/14, 58 y.o.   MRN: [id number]  Chief Complaint  Patient presents with  . Knee Pain   Pt presents to the office today with recurrent right knee pain. He was seen in our office on 04/21/19 and given prednisone that helped, but now has returned.  Knee Pain  The incident occurred more than 1 week ago. There was no injury mechanism. The pain is present in the left knee. The quality of the pain is described as aching. The pain is at a severity of 7/10. The pain is moderate. The pain has been constant since onset. Pertinent negatives include no muscle weakness or numbness. He reports no foreign bodies present. The symptoms are aggravated by movement and weight bearing. He has tried rest, non-weight bearing and NSAIDs for the symptoms. The treatment provided mild relief.      Review of Systems  Neurological: Negative for numbness.  All other systems reviewed and are negative.      Objective:   Physical Exam Vitals signs reviewed.  Constitutional:      General: He is not in acute distress.    Appearance: He is well-developed.  HENT:     Head: Normocephalic.  Eyes:     General:        Right eye: No discharge.        Left eye: No discharge.     Pupils: Pupils are equal, round, and reactive to light.  Neck:     Musculoskeletal: Normal range of motion and neck supple.     Thyroid: No thyromegaly.  Cardiovascular:     Rate and Rhythm: Normal rate and regular rhythm.     Heart sounds: Normal heart sounds. No murmur.  Pulmonary:     Effort: Pulmonary effort is normal. No respiratory distress.     Breath sounds: Normal breath sounds. No wheezing.  Abdominal:     General: Bowel sounds are normal. There is no distension.     Palpations: Abdomen is soft.     Tenderness: There is no abdominal tenderness.  Musculoskeletal:        General: Tenderness present.     Comments: Pain in left medial knee with flexion and extension.    Skin:    General: Skin is warm and dry.     Findings: No erythema or rash.  Neurological:     Mental Status: He is alert and oriented to person, place, and time.     Cranial Nerves: No cranial nerve deficit.     Deep Tendon Reflexes: Reflexes are normal and symmetric.  Psychiatric:        Behavior: Behavior normal.        Thought Content: Thought content normal.        Judgment: Judgment normal.       BP 139/84   Pulse 67   Temp 98 F (36.7 C) (Temporal)   Ht 6' (1.829 m)   Wt 241 lb (109.3 kg)   SpO2 99%   BMI 32.69 kg/m      Assessment & Plan:  Adam Oneal comes in today with chief complaint of Knee Pain   Diagnosis and orders addressed:  1. Acute pain of left knee Rest Ice  ROM exercises encouraged Referral pending No other NSAIDS Keep follow up with PCP - diclofenac (VOLTAREN) 75 MG EC tablet; Take 1 tablet (75 mg total) by mouth 2 (two) times daily.  Dispense: 30  tablet; Refill: 0 - Ambulatory referral to Physical Therapy - Ambulatory referral to Orthopedic Surgery - predniSONE (STERAPRED UNI-PAK 21 TAB) 10 MG (21) TBPK tablet; Use as directed  Dispense: 21 tablet; Refill: 0 - DG Knee 1-2 Views Left; Future    Jannifer Rodney, FNP

## 2019-05-07 NOTE — Patient Instructions (Signed)
Acute Knee Pain, Adult °Acute knee pain is sudden and may be caused by damage, swelling, or irritation of the muscles and tissues that support your knee. The injury may result from: °· A fall. °· An injury to your knee from twisting motions. °· A hit to the knee. °· Infection. °Acute knee pain may go away on its own with time and rest. If it does not, your health care provider may order tests to find the cause of the pain. These may include: °· Imaging tests, such as an X-ray, MRI, or ultrasound. °· Joint aspiration. In this test, fluid is removed from the knee. °· Arthroscopy. In this test, a lighted tube is inserted into the knee and an image is projected onto a TV screen. °· Biopsy. In this test, a sample of tissue is removed from the body and studied under a microscope. °Follow these instructions at home: °Pay attention to any changes in your symptoms. Take these actions to relieve your pain. °If you have a knee sleeve or brace: ° °· Wear the sleeve or brace as told by your health care provider. Remove it only as told by your health care provider. °· Loosen the sleeve or brace if your toes tingle, become numb, or turn cold and blue. °· Keep the sleeve or brace clean. °· If the sleeve or brace is not waterproof: °? Do not let it get wet. °? Cover it with a watertight covering when you take a bath or shower. °Activity °· Rest your knee. °· Do not do things that cause pain or make pain worse. °· Avoid high-impact activities or exercises, such as running, jumping rope, or doing jumping jacks. °· Work with a physical therapist to make a safe exercise program, as recommended by your health care provider. Do exercises as told by your physical therapist. °Managing pain, stiffness, and swelling ° °· If directed, put ice on the knee: °? Put ice in a plastic bag. °? Place a towel between your skin and the bag. °? Leave the ice on for 20 minutes, 2-3 times a day. °· If directed, use an elastic bandage to put pressure  (compression) on your injured knee. This may control swelling, give support, and help with discomfort. °General instructions °· Take over-the-counter and prescription medicines only as told by your health care provider. °· Raise (elevate) your knee above the level of your heart when you are sitting or lying down. °· Sleep with a pillow under your knee. °· Do not use any products that contain nicotine or tobacco, such as cigarettes, e-cigarettes, and chewing tobacco. These can delay healing. If you need help quitting, ask your health care provider. °· If you are overweight, work with your health care provider and a dietitian to set a weight-loss goal that is healthy and reasonable for you. Extra weight can put pressure on your knee. °· Keep all follow-up visits as told by your health care provider. This is important. °Contact a health care provider if: °· Your knee pain continues, changes, or gets worse. °· You have a fever along with knee pain. °· Your knee feels warm to the touch. °· Your knee buckles or locks up. °Get help right away if: °· Your knee swells, and the swelling becomes worse. °· You cannot move your knee. °· You have severe pain in your knee. °Summary °· Acute knee pain can be caused by a fall, an injury, an infection, or damage, swelling, or irritation of the tissues that support your knee. °·   Your health care provider may perform tests to find out the cause of the pain. °· Pay attention to any changes in your symptoms. Relieve your pain with rest, medicines, light activity, and use of ice. °· Get help if your pain continues or becomes worse, your knee swells, or you cannot move your knee. °This information is not intended to replace advice given to you by your health care provider. Make sure you discuss any questions you have with your health care provider. °Document Released: 05/13/2007 Document Revised: 12/26/2017 Document Reviewed: 12/26/2017 °Elsevier Patient Education © 2020 [employer] ° °

## 2019-05-07 NOTE — Telephone Encounter (Signed)
Please review

## 2019-05-08 NOTE — Telephone Encounter (Signed)
Letter sent to his MyChart.  

## 2019-05-25 ENCOUNTER — Other Ambulatory Visit: Payer: Self-pay | Admitting: Family Medicine

## 2019-06-04 ENCOUNTER — Other Ambulatory Visit: Payer: Self-pay | Admitting: Family Medicine

## 2019-06-08 ENCOUNTER — Ambulatory Visit (HOSPITAL_COMMUNITY): Admitting: Psychiatry

## 2019-06-09 ENCOUNTER — Other Ambulatory Visit: Payer: Self-pay

## 2019-06-09 ENCOUNTER — Ambulatory Visit (INDEPENDENT_AMBULATORY_CARE_PROVIDER_SITE_OTHER): Admitting: Psychiatry

## 2019-06-09 ENCOUNTER — Encounter (HOSPITAL_COMMUNITY): Payer: Self-pay | Admitting: Psychiatry

## 2019-06-09 DIAGNOSIS — F3162 Bipolar disorder, current episode mixed, moderate: Secondary | ICD-10-CM | POA: Diagnosis not present

## 2019-06-09 MED ORDER — DULOXETINE HCL 60 MG PO CPEP: 60.0000 mg | ORAL_CAPSULE | Freq: Every day | ORAL | 0 refills | Status: DC

## 2019-06-09 MED ORDER — LITHIUM CARBONATE ER 450 MG PO TBCR: 900.0000 mg | EXTENDED_RELEASE_TABLET | Freq: Every day | ORAL | 3 refills | Status: DC

## 2019-06-09 NOTE — Progress Notes (Signed)
Virtual Visit via Telephone Note  I connected with Adam Oneal on 06/09/19 at  9:40 AM EST by telephone and verified that I am speaking with the correct person using two identifiers.   I discussed the limitations, risks, security and privacy concerns of performing an evaluation and management service by telephone and the availability of in person appointments. I also discussed with the patient that there may be a patient responsible charge related to this service. The patient expressed understanding and agreed to proceed.     I discussed the assessment and treatment plan with the patient. The patient was provided an opportunity to ask questions and all were answered. The patient agreed with the plan and demonstrated an understanding of the instructions.   The patient was advised to call back or seek an in-person evaluation if the symptoms worsen or if the condition fails to improve as anticipated.  I provided 15 minutes of non-face-to-face time during this encounter.   Levonne Spiller, MD  [facility] MD/PA/NP OP Progress Note  06/09/2019 10:14 AM Adam Oneal  MRN:  [id number]  Chief Complaint:  Chief Complaint    Depression; Anxiety; Manic Behavior; Follow-up     HPI: This patient is a 58 year old married white male who lives with his wife in [place]. 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 The couple have no children   The patient was referred by his primary care provider from [facility] for further assessment and treatment of bipolar disorder.  The patient is somewhat of a vague historian and isn't quite sure how he got diagnosed with bipolar disorder. He states that he has had "mood swings" all his life. He went into the [employer] at 58 years of age and got out 20 years later. He states that his mood was up and down throughout the whole time but he dealt with that by drinking quite a bit of alcohol as well as caffeine during the day.  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 His moods are "up and down" he states that at times he does have symptoms of overt mania such as inability to sleep and hypersexuality.  In the fall and winter he becomes more depressed and tired with low energy and motivation. He has been on lithium for a number of years in his current level is 0.6. Since he moved here he has had to work the night shift for the first time in his life. He has difficulty sleeping during the day and also doesn't remember to take off his medications. He states that he tends to obsess and wants his house spotlessly clean. At times he rants and raves about situations at work but would never really hurt anybody or himself. He's never had psychiatric inpatient treatment. His wife describes him as "very anxious" and unhappy with his job. He calls in sick about once a month. He has no friends and doesn't really trust other people. 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  Last time he seemed a bit more paranoid and I added Risperdal to his regimen.  He states that it was fine for about a week and then he started to get dizzy with nausea and vomiting and he stopped it.  Nevertheless he states that  he is doing a lot better.  He thought about it and realized that nobody at work is really talking about him or even looking at him.  He realized that this did not make any sense.  He states now that he enjoys going to work and he does not feel as worried or paranoid while he was there.  He states that he is sleeping well his energy is good and he denies manic or depressive symptoms or thoughts of self-harm. Visit Diagnosis:    ICD-10-CM   1. Bipolar 1 disorder, mixed, moderate (HCC)  F31.62     Past Psychiatric History: Long-term outpatient treatment for bipolar disorder  Past  Medical History:  Past Medical History:  Diagnosis Date  . Acid reflux   . Anxiety 01/1998  . Cataract 02/2004  . Depression 01/1998  . Diabetes mellitus without complication ([place]) 02/6577  . Diabetes mellitus, type II ([place])   . Hyperlipidemia 11/2006  . Hypertension   . Hypertension    History reviewed. No pertinent surgical history.  Family Psychiatric History: see below  Family History:  Family History  Problem Relation Age of Onset  . Cancer Mother 74       lung  . Heart disease Father   . Hyperlipidemia Father   . Diabetes Brother   . Leukemia Brother   . Diabetes Paternal Grandmother   . Heart disease Paternal Grandmother   . Alcohol abuse Paternal Grandfather   . Diabetes Brother   . Depression Maternal Aunt     Social History:  Social History   Socioeconomic History  . Marital status: Married    Spouse name: Not on file  . Number of children: Not on file  . Years of education: Not on file  . Highest education level: Not on file  Occupational History  . Not on file  Social Needs  . Financial resource strain: Not on file  . Food insecurity    Worry: Not on file    Inability: Not on file  . Transportation needs    Medical: Not on file    Non-medical: Not on file  Tobacco Use  . Smoking status: Former Smoker    Packs/day: 1.00    Years: 12.00    Pack years: 12.00    Types: Cigarettes    Start date: 01/05/1978    Quit date: 12/28/1989    Years since quitting: 29.4  . Smokeless tobacco: Former Network engineer and Sexual Activity  . Alcohol use: No    Alcohol/week: 0.0 standard drinks    Comment: 03-26-16 per pt no  . Drug use: No    Comment: 03-26-16 per pt currently no but use to  . Sexual activity: Never  Lifestyle  . Physical activity    Days per week: Not on file    Minutes per session: Not on file  . Stress: Not on file  Relationships  . Social Herbalist on phone: Not on file    Gets together: Not on file    Attends religious  service: Not on file    Active member of club or organization: Not on file    Attends meetings of clubs or organizations: Not on file    Relationship status: Not on file  Other Topics Concern  . Not on file  Social History Narrative  . Not on file    Allergies:  Allergies  Allergen Reactions  . Lipitor [Atorvastatin] Shortness Of Breath    Metabolic Disorder  Labs: Lab Results  Component Value Date   HGBA1C 6.2 04/10/2018   No results found for: PROLACTIN Lab Results  Component Value Date   CHOL 147 04/10/2018   TRIG 81 04/10/2018   HDL 58 04/10/2018   CHOLHDL 2.5 04/10/2018   LDLCALC 73 04/10/2018   LDLCALC 70 07/02/2017   Lab Results  Component Value Date   TSH 0.96 02/09/2019   TSH 0.893 04/10/2018    Therapeutic Level Labs: Lab Results  Component Value Date   LITHIUM 0.3 (L) 02/09/2019   LITHIUM 0.4 (L) 04/10/2018   No results found for: VALPROATE No components found for:  CBMZ  Current Medications: Current Outpatient Medications  Medication Sig Dispense Refill  . cetirizine (ZYRTEC) 10 MG tablet Take 1 tablet (10 mg total) by mouth daily. 90 tablet 3  . cyclobenzaprine (FLEXERIL) 10 MG tablet Take 1 tablet (10 mg total) by mouth 3 (three) times daily as needed for muscle spasms. 270 tablet 3  . diclofenac (VOLTAREN) 75 MG EC tablet Take 1 tablet (75 mg total) by mouth 2 (two) times daily. 30 tablet 0  . DULoxetine (CYMBALTA) 60 MG capsule Take 1 capsule (60 mg total) by mouth daily. 30 capsule 0  . fluticasone (FLONASE) 50 MCG/ACT nasal spray Place 2 sprays into both nostrils daily. (Patient taking differently: Place 2 sprays into both nostrils as needed. ) 16 g 11  . lisinopril (ZESTRIL) 10 MG tablet TAKE 1 TABLET DAILY 90 tablet 3  . lithium carbonate (ESKALITH) 450 MG CR tablet Take 2 tablets (900 mg total) by mouth at bedtime. 60 tablet 3  . meloxicam (MOBIC) 15 MG tablet TAKE 1 TABLET DAILY 90 tablet 3  . predniSONE (STERAPRED UNI-PAK 21 TAB) 10 MG  (21) TBPK tablet Use as directed 21 tablet 0  . tamsulosin (FLOMAX) 0.4 MG CAPS capsule TAKE 1 CAPSULE DAILY 90 capsule 0   No current facility-administered medications for this visit.      Musculoskeletal: Strength & Muscle Tone: within normal limits Gait & Station: normal Patient leans: N/A  Psychiatric Specialty Exam: Review of Systems  Musculoskeletal: Positive for joint pain.  All other systems reviewed and are negative.   There were no vitals taken for this visit.There is no height or weight on file to calculate BMI.  General Appearance: NA  Eye Contact:  NA  Speech:  Clear and Coherent  Volume:  Normal  Mood:  Euthymic  Affect:  NA  Thought Process:  Goal Directed  Orientation:  Full (Time, Place, and Person)  Thought Content: WDL   Suicidal Thoughts:  No  Homicidal Thoughts:  No  Memory:  Immediate;   Good Recent;   Good Remote;   Good  Judgement:  Fair  Insight:  Fair  Psychomotor Activity:  Normal  Concentration:  Concentration: Good and Attention Span: Good  Recall:  Good  Fund of Knowledge: Good  Language: Good  Akathisia:  No  Handed:  Right  AIMS (if indicated): not done  Assets:  Communication Skills Desire for Improvement Physical Health Resilience Social Support Talents/Skills  ADL's:  Intact  Cognition: WNL  Sleep:  Good   Screenings: PHQ2-9     Office Visit from 05/07/2019 in [place] Office Visit from 04/10/2018 in [place] Office Visit from 09/10/2017 in [place] Office Visit from 01/31/2017 in [place] Office Visit from 01/14/2017 in [place]  PHQ-2 Total Score  0  0  0  0  0       Assessment and Plan: This patient is a 58 year old male with a history of bipolar disorder.  He seems to be doing better despite not being able to stay on the Risperdal.  For now we will continue lithium carbonate 900 mg daily and  Cymbalta 60 mg daily.  He will return to see me in 3 months or call sooner if needed   Levonne Spiller, MD 06/09/2019, 10:14 AM

## 2019-08-17 ENCOUNTER — Ambulatory Visit (INDEPENDENT_AMBULATORY_CARE_PROVIDER_SITE_OTHER): Admitting: Physician Assistant

## 2019-08-17 ENCOUNTER — Encounter: Payer: Self-pay | Admitting: Physician Assistant

## 2019-08-17 ENCOUNTER — Other Ambulatory Visit: Payer: Self-pay

## 2019-08-17 VITALS — BP 152/95 | HR 74 | Temp 97.5°F | Resp 20 | Ht 72.0 in | Wt 238.0 lb

## 2019-08-17 DIAGNOSIS — M25462 Effusion, left knee: Secondary | ICD-10-CM | POA: Diagnosis not present

## 2019-08-17 DIAGNOSIS — M25562 Pain in left knee: Secondary | ICD-10-CM

## 2019-08-17 MED ORDER — PREDNISONE 10 MG (48) PO TBPK: ORAL_TABLET | ORAL | 0 refills | Status: DC

## 2019-08-20 NOTE — Progress Notes (Signed)
Acute Office Visit  Subjective:    Patient ID: Adam Oneal, male    DOB: [birth year], 59 y.o.   MRN: [id number]  Chief Complaint  Patient presents with  . Knee Pain    Knee Pain  The incident occurred more than 1 week ago. The incident occurred at work. The injury mechanism is unknown. The pain is present in the left knee. The pain is at a severity of 6/10. The pain has been worsening since onset. Associated symptoms include an inability to bear weight. Pertinent negatives include no numbness. The symptoms are aggravated by movement and weight bearing. He has tried ice, elevation, non-weight bearing, rest and NSAIDs for the symptoms. The treatment provided mild relief.     Past Medical History:  Diagnosis Date  . Acid reflux   . Anxiety 01/1998  . Cataract 02/2004  . Depression 01/1998  . Diabetes mellitus without complication (HCC) 12/1999  . Diabetes mellitus, type II (HCC)   . Hyperlipidemia 11/2006  . Hypertension   . Hypertension     History reviewed. No pertinent surgical history.  Family History  Problem Relation Age of Onset  . Cancer Mother 68       lung  . Heart disease Father   . Hyperlipidemia Father   . Diabetes Brother   . Leukemia Brother   . Diabetes Paternal Grandmother   . Heart disease Paternal Grandmother   . Alcohol abuse Paternal Grandfather   . Diabetes Brother   . Depression Maternal Aunt     Social History   Socioeconomic History  . Marital status: Married    Spouse name: Not on file  . Number of children: Not on file  . Years of education: Not on file  . Highest education level: Not on file  Occupational History  . Not on file  Tobacco Use  . Smoking status: Former Smoker    Packs/day: 1.00    Years: 12.00    Pack years: 12.00    Types: Cigarettes    Start date: 01/05/1978    Quit date: 12/28/1989    Years since quitting: 29.6  . Smokeless tobacco: Former Engineer, water and Sexual Activity  . Alcohol use: No   Alcohol/week: 0.0 standard drinks    Comment: 03-26-16 per pt no  . Drug use: No    Comment: 03-26-16 per pt currently no but use to  . Sexual activity: Never  Other Topics Concern  . Not on file  Social History Narrative  . Not on file   Social Determinants of Health   Financial Resource Strain:   . Difficulty of Paying Living Expenses: Not on file  Food Insecurity:   . Worried About Programme researcher, broadcasting/film/video in the Last Year: Not on file  . Ran Out of Food in the Last Year: Not on file  Transportation Needs:   . Lack of Transportation (Medical): Not on file  . Lack of Transportation (Non-Medical): Not on file  Physical Activity:   . Days of Exercise per Week: Not on file  . Minutes of Exercise per Session: Not on file  Stress:   . Feeling of Stress : Not on file  Social Connections:   . Frequency of Communication with Friends and Family: Not on file  . Frequency of Social Gatherings with Friends and Family: Not on file  . Attends Religious Services: Not on file  . Active Member of Clubs or Organizations: Not on file  . Attends Banker  Meetings: Not on file  . Marital Status: Not on file  Intimate Partner Violence:   . Fear of Current or Ex-Partner: Not on file  . Emotionally Abused: Not on file  . Physically Abused: Not on file  . Sexually Abused: Not on file    Outpatient Medications Prior to Visit  Medication Sig Dispense Refill  . cetirizine (ZYRTEC) 10 MG tablet Take 1 tablet (10 mg total) by mouth daily. 90 tablet 3  . cyclobenzaprine (FLEXERIL) 10 MG tablet Take 1 tablet (10 mg total) by mouth 3 (three) times daily as needed for muscle spasms. 270 tablet 3  . DULoxetine (CYMBALTA) 60 MG capsule Take 1 capsule (60 mg total) by mouth daily. 30 capsule 0  . lithium carbonate (ESKALITH) 450 MG CR tablet Take 2 tablets (900 mg total) by mouth at bedtime. 60 tablet 3  . tamsulosin (FLOMAX) 0.4 MG CAPS capsule TAKE 1 CAPSULE DAILY 90 capsule 0  . diclofenac  (VOLTAREN) 75 MG EC tablet Take 1 tablet (75 mg total) by mouth 2 (two) times daily. 30 tablet 0  . meloxicam (MOBIC) 15 MG tablet TAKE 1 TABLET DAILY 90 tablet 3  . fluticasone (FLONASE) 50 MCG/ACT nasal spray Place 2 sprays into both nostrils daily. (Patient not taking: Reported on 08/17/2019) 16 g 11  . lisinopril (ZESTRIL) 10 MG tablet TAKE 1 TABLET DAILY (Patient not taking: Reported on 08/17/2019) 90 tablet 3  . predniSONE (STERAPRED UNI-PAK 21 TAB) 10 MG (21) TBPK tablet Use as directed 21 tablet 0   No facility-administered medications prior to visit.    Allergies  Allergen Reactions  . Lipitor [Atorvastatin] Shortness Of Breath    Review of Systems  Constitutional: Negative.  Negative for appetite change and fatigue.  Eyes: Negative for pain and visual disturbance.  Respiratory: Negative.  Negative for cough, chest tightness, shortness of breath and wheezing.   Cardiovascular: Negative.  Negative for chest pain, palpitations and leg swelling.  Gastrointestinal: Negative.  Negative for abdominal pain, diarrhea, nausea and vomiting.  Genitourinary: Negative.   Musculoskeletal: Positive for arthralgias, joint swelling and myalgias.  Skin: Negative.  Negative for color change and rash.  Neurological: Negative.  Negative for weakness, numbness and headaches.  Psychiatric/Behavioral: Negative.        Objective:    Physical Exam Vitals and nursing note reviewed.  Constitutional:      General: He is not in acute distress.    Appearance: He is well-developed.  HENT:     Head: Normocephalic and atraumatic.  Eyes:     Conjunctiva/sclera: Conjunctivae normal.     Pupils: Pupils are equal, round, and reactive to light.  Cardiovascular:     Rate and Rhythm: Normal rate and regular rhythm.     Heart sounds: Normal heart sounds.  Pulmonary:     Effort: Pulmonary effort is normal. No respiratory distress.     Breath sounds: Normal breath sounds.  Musculoskeletal:     Left knee:  Swelling and deformity present. Decreased range of motion. Tenderness present.       Legs:  Skin:    General: Skin is warm and dry.  Psychiatric:        Behavior: Behavior normal.     BP (!) 152/95   Pulse 74   Temp (!) 97.5 F (36.4 C) (Temporal)   Resp 20   Ht 6' (1.829 m)   Wt 238 lb (108 kg)   SpO2 94%   BMI 32.28 kg/m  Wt Readings from Last  3 Encounters:  08/17/19 238 lb (108 kg)  05/07/19 241 lb (109.3 kg)  04/21/19 244 lb (110.7 kg)    Health Maintenance Due  Topic Date Due  . PNEUMOCOCCAL POLYSACCHARIDE VACCINE AGE 45-64 HIGH RISK  04/16/1961  . COLONOSCOPY  04/16/2009  . OPHTHALMOLOGY EXAM  10/24/2017  . FOOT EXAM  07/02/2018  . HEMOGLOBIN A1C  10/09/2018    There are no preventive care reminders to display for this patient.   Lab Results  Component Value Date   TSH 0.96 02/09/2019   Lab Results  Component Value Date   WBC 10.5 04/10/2018   HGB 14.9 04/10/2018   HCT 45.5 04/10/2018   MCV 91 04/10/2018   PLT 322 04/10/2018   Lab Results  Component Value Date   NA 137 02/09/2019   K 4.1 02/09/2019   CO2 21 02/09/2019   GLUCOSE 247 (H) 02/09/2019   BUN 14 02/09/2019   CREATININE 0.85 02/09/2019   BILITOT 0.2 04/10/2018   ALKPHOS 96 04/10/2018   AST 17 04/10/2018   ALT 18 04/10/2018   PROT 6.7 04/10/2018   ALBUMIN 4.6 04/10/2018   CALCIUM 9.4 02/09/2019   Lab Results  Component Value Date   CHOL 147 04/10/2018   Lab Results  Component Value Date   HDL 58 04/10/2018   Lab Results  Component Value Date   LDLCALC 73 04/10/2018   Lab Results  Component Value Date   TRIG 81 04/10/2018   Lab Results  Component Value Date   CHOLHDL 2.5 04/10/2018   Lab Results  Component Value Date   HGBA1C 6.2 04/10/2018       Assessment & Plan:   Problem List Items Addressed This Visit    None    Visit Diagnoses    Left medial knee pain    -  Primary   Relevant Medications   predniSONE (STERAPRED UNI-PAK 48 TAB) 10 MG (48) TBPK tablet     Other Relevant Orders   Ambulatory referral to Orthopedic Surgery   Pain and swelling of knee, left       Relevant Medications   predniSONE (STERAPRED UNI-PAK 48 TAB) 10 MG (48) TBPK tablet   Other Relevant Orders   Ambulatory referral to Orthopedic Surgery       Meds ordered this encounter  Medications  . predniSONE (STERAPRED UNI-PAK 48 TAB) 10 MG (48) TBPK tablet    Sig: Take as directed for 12 days    Dispense:  48 tablet    Refill:  0    Order Specific Question:   Supervising Provider    Answer:   Raliegh Ip [[id number]]     Remus Loffler, PA-C

## 2019-08-23 ENCOUNTER — Other Ambulatory Visit: Payer: Self-pay | Admitting: Family Medicine

## 2019-08-24 NOTE — Telephone Encounter (Signed)
Lmtcb to schedule appt-cb 1/25

## 2019-08-24 NOTE — Telephone Encounter (Signed)
Patient needs to be seen. Last labs 01/2019

## 2019-09-01 ENCOUNTER — Other Ambulatory Visit (HOSPITAL_COMMUNITY): Payer: Self-pay | Admitting: Psychiatry

## 2019-09-09 ENCOUNTER — Encounter (HOSPITAL_COMMUNITY): Payer: Self-pay | Admitting: Psychiatry

## 2019-09-09 ENCOUNTER — Ambulatory Visit (INDEPENDENT_AMBULATORY_CARE_PROVIDER_SITE_OTHER): Payer: Self-pay | Admitting: Psychiatry

## 2019-09-09 ENCOUNTER — Other Ambulatory Visit: Payer: Self-pay

## 2019-09-09 DIAGNOSIS — F3162 Bipolar disorder, current episode mixed, moderate: Secondary | ICD-10-CM

## 2019-09-09 MED ORDER — LITHIUM CARBONATE ER 450 MG PO TBCR: 900.0000 mg | EXTENDED_RELEASE_TABLET | Freq: Every day | ORAL | 3 refills | Status: DC

## 2019-09-09 MED ORDER — DULOXETINE HCL 60 MG PO CPEP: 60.0000 mg | ORAL_CAPSULE | Freq: Every day | ORAL | 0 refills | Status: DC

## 2019-09-09 NOTE — Progress Notes (Signed)
Virtual Visit via Telephone Note  I connected with Adam Oneal on 09/09/19 at  1:20 PM EST by telephone and verified that I am speaking with the correct person using two identifiers.   I discussed the limitations, risks, security and privacy concerns of performing an evaluation and management service by telephone and the availability of in person appointments. I also discussed with the patient that there may be a patient responsible charge related to this service. The patient expressed understanding and agreed to proceed.    I discussed the assessment and treatment plan with the patient. The patient was provided an opportunity to ask questions and all were answered. The patient agreed with the plan and demonstrated an understanding of the instructions.   The patient was advised to call back or seek an in-person evaluation if the symptoms worsen or if the condition fails to improve as anticipated.  I provided 15 minutes of non-face-to-face time during this encounter.   Levonne Spiller, MD  [facility] MD/PA/NP OP Progress Note  09/09/2019 1:32 PM Adam Oneal  MRN:  [id number]  Chief Complaint:  Chief Complaint    Depression; Manic Behavior; Follow-up     HPI: This patient is a 59 year old married white male who lives with his wife in [place]. 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 The couple have no children   The patient was referred by his primary care provider from [facility] [facility] for further assessment and treatment of bipolar disorder.  The patient is somewhat of a vague historian and isn't quite sure how he got diagnosed with bipolar disorder. He states that he has had "mood swings" all his life. He went into the [employer] at 59 years of age and got out 24 years later. He states that his mood was up and down throughout the whole time but he dealt with that by drinking quite a bit of alcohol as well as caffeine during the day. 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 His moods are "up and down" he states that at times he does have symptoms of overt mania such as inability to sleep and hypersexuality.  In the fall and winter he becomes more depressed and tired with low energy and motivation. He has been on lithium for a number of years in his current level is 0.6. Since he moved here he has had to work the night shift for the first time in his life. He has difficulty sleeping during the day and also doesn't remember to take off his medications. He states that he tends to obsess and wants his house spotlessly clean. At times he rants and raves about situations at work but would never really hurt anybody or himself. He's never had psychiatric inpatient treatment. His wife describes him as "very anxious" and unhappy with his job. He calls in sick about once a month. He has no friends and doesn't really trust other people. 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  He states he was somewhat depressed around Christmas but this happens to him every year.  He is on a new shift at work and really likes the people in the assignments that he has.  He has been having some knee pain but he had a course of  prednisone and it is better now.  He denies any other current health problems or side effects from medication.  He states that his mood has been stable and is neither manic nor depressed.  Overall he states he feels good.  He has been spending a lot of time cutting down trees on his property.  He denies any thoughts of suicide or self-harm and he is goal-directed in his thinking. Visit Diagnosis:    ICD-10-CM   1. Bipolar 1 disorder, mixed, moderate (HCC)  F31.62     Past Psychiatric History: Long-term outpatient treatment for bipolar disorder  Past Medical History:  Past Medical  History:  Diagnosis Date  . Acid reflux   . Anxiety 01/1998  . Cataract 02/2004  . Depression 01/1998  . Diabetes mellitus without complication ([place]) 92/3300  . Diabetes mellitus, type II ([place])   . Hyperlipidemia 11/2006  . Hypertension   . Hypertension    History reviewed. No pertinent surgical history.  Family Psychiatric History: See below  Family History:  Family History  Problem Relation Age of Onset  . Cancer Mother 63       lung  . Heart disease Father   . Hyperlipidemia Father   . Diabetes Brother   . Leukemia Brother   . Diabetes Paternal Grandmother   . Heart disease Paternal Grandmother   . Alcohol abuse Paternal Grandfather   . Diabetes Brother   . Depression Maternal Aunt     Social History:  Social History   Socioeconomic History  . Marital status: Married    Spouse name: Not on file  . Number of children: Not on file  . Years of education: Not on file  . Highest education level: Not on file  Occupational History  . Not on file  Tobacco Use  . Smoking status: Former Smoker    Packs/day: 1.00    Years: 12.00    Pack years: 12.00    Types: Cigarettes    Start date: 01/05/1978    Quit date: 12/28/1989    Years since quitting: 29.7  . Smokeless tobacco: Former Network engineer and Sexual Activity  . Alcohol use: No    Alcohol/week: 0.0 standard drinks    Comment: 03-26-16 per pt no  . Drug use: No    Comment: 03-26-16 per pt currently no but use to  . Sexual activity: Never  Other Topics Concern  . Not on file  Social History Narrative  . Not on file   Social Determinants of Health   Financial Resource Strain:   . Difficulty of Paying Living Expenses: Not on file  Food Insecurity:   . Worried About Charity fundraiser in the Last Year: Not on file  . Ran Out of Food in the Last Year: Not on file  Transportation Needs:   . Lack of Transportation (Medical): Not on file  . Lack of Transportation (Non-Medical): Not on file  Physical Activity:    . Days of Exercise per Week: Not on file  . Minutes of Exercise per Session: Not on file  Stress:   . Feeling of Stress : Not on file  Social Connections:   . Frequency of Communication with Friends and Family: Not on file  . Frequency of Social Gatherings with Friends and Family: Not on file  . Attends Religious Services: Not on file  . Active Member of Clubs or Organizations: Not on file  . Attends Archivist Meetings: Not on file  . Marital  Status: Not on file    Allergies:  Allergies  Allergen Reactions  . Lipitor [Atorvastatin] Shortness Of Breath    Metabolic Disorder Labs: Lab Results  Component Value Date   HGBA1C 6.2 04/10/2018   No results found for: PROLACTIN Lab Results  Component Value Date   CHOL 147 04/10/2018   TRIG 81 04/10/2018   HDL 58 04/10/2018   CHOLHDL 2.5 04/10/2018   LDLCALC 73 04/10/2018   LDLCALC 70 07/02/2017   Lab Results  Component Value Date   TSH 0.96 02/09/2019   TSH 0.893 04/10/2018    Therapeutic Level Labs: Lab Results  Component Value Date   LITHIUM 0.3 (L) 02/09/2019   LITHIUM 0.4 (L) 04/10/2018   No results found for: VALPROATE No components found for:  CBMZ  Current Medications: Current Outpatient Medications  Medication Sig Dispense Refill  . cetirizine (ZYRTEC) 10 MG tablet Take 1 tablet (10 mg total) by mouth daily. 90 tablet 3  . cyclobenzaprine (FLEXERIL) 10 MG tablet Take 1 tablet (10 mg total) by mouth 3 (three) times daily as needed for muscle spasms. 270 tablet 3  . DULoxetine (CYMBALTA) 60 MG capsule Take 1 capsule (60 mg total) by mouth daily. 90 capsule 0  . lisinopril (ZESTRIL) 10 MG tablet TAKE 1 TABLET DAILY (Patient not taking: Reported on 08/17/2019) 90 tablet 3  . lithium carbonate (ESKALITH) 450 MG CR tablet Take 2 tablets (900 mg total) by mouth at bedtime. 180 tablet 3  . tamsulosin (FLOMAX) 0.4 MG CAPS capsule TAKE 1 CAPSULE DAILY 90 capsule 0   No current facility-administered  medications for this visit.     Musculoskeletal: Strength & Muscle Tone: within normal limits Gait & Station: normal Patient leans: N/A  Psychiatric Specialty Exam: Review of Systems  Musculoskeletal: Positive for arthralgias.  All other systems reviewed and are negative.   There were no vitals taken for this visit.There is no height or weight on file to calculate BMI.  General Appearance: NA  Eye Contact:  NA  Speech:  Clear and Coherent  Volume:  Normal  Mood:  Euthymic  Affect:  NA  Thought Process:  Goal Directed  Orientation:  Full (Time, Place, and Person)  Thought Content: WDL   Suicidal Thoughts:  No  Homicidal Thoughts:  No  Memory:  Immediate;   Good Recent;   Good Remote;   Fair  Judgement:  Good  Insight:  Fair  Psychomotor Activity:  Normal  Concentration:  Concentration: Good and Attention Span: Good  Recall:  Good  Fund of Knowledge: Good  Language: Good  Akathisia:  No  Handed:  Right  AIMS (if indicated): not done  Assets:  Communication Skills Desire for Improvement Physical Health Resilience Social Support Talents/Skills  ADL's:  Intact  Cognition: WNL  Sleep:  Good   Screenings: PHQ2-9     Office Visit from 08/17/2019 in [facility] Visit from 05/07/2019 in [facility] Office Visit from 04/10/2018 in [facility] Office Visit from 09/10/2017 in [facility] Office Visit from 01/31/2017 in [facility]  PHQ-2 Total Score  0  0  0  0  0       Assessment and Plan: This patient is a 59 year old male with a history of bipolar disorder.  He seems to be doing well and denies any current psychotic symptoms or mania.  He denies depression or suicidal ideation.  He will continue lithium carbonate 900 mg daily  and Cymbalta 60 mg daily.  On next visit we will we will check his lithium level and other appropriate laboratories.  He will return  to see me in 3 months or call sooner as needed   Levonne Spiller, MD 09/09/2019, 1:32 PM

## 2019-10-07 ENCOUNTER — Other Ambulatory Visit (HOSPITAL_COMMUNITY): Payer: Self-pay | Admitting: Psychiatry

## 2019-10-07 ENCOUNTER — Other Ambulatory Visit: Payer: Self-pay | Admitting: Family Medicine

## 2019-10-07 ENCOUNTER — Telehealth (HOSPITAL_COMMUNITY): Payer: Self-pay | Admitting: *Deleted

## 2019-10-07 MED ORDER — DULOXETINE HCL 60 MG PO CPEP: 60.0000 mg | ORAL_CAPSULE | Freq: Every day | ORAL | 0 refills | Status: DC

## 2019-10-07 MED ORDER — LITHIUM CARBONATE ER 450 MG PO TBCR: 900.0000 mg | EXTENDED_RELEASE_TABLET | Freq: Every day | ORAL | 0 refills | Status: DC

## 2019-10-07 MED ORDER — CELECOXIB 100 MG PO CAPS: 100.0000 mg | ORAL_CAPSULE | Freq: Two times a day (BID) | ORAL | 0 refills | Status: DC

## 2019-10-07 NOTE — Telephone Encounter (Signed)
lmtcb

## 2019-10-07 NOTE — Telephone Encounter (Signed)
Message was give

## 2019-10-07 NOTE — Telephone Encounter (Signed)
Sent to [facility]

## 2019-10-07 NOTE — Telephone Encounter (Signed)
Please let him know that because of some possible interactions with the lithium which she is now on I have sent Celebrex instead which should work similar and help with anti-inflammatory but does not have that interaction. Arville Care, MD [facility] 10/07/2019, 12:43 PM

## 2019-10-07 NOTE — Telephone Encounter (Signed)
  Medication Request  10/07/2019  What is the name of the medication? maloxicam (?) [facility] and he is out. He wants to know if we can send in [facility] supply to [facility]  Have you contacted your pharmacy to request a refill? no  Which pharmacy would you like this sent to? [facility]   Patient notified that their request is being sent to the clinical staff for review and that they should receive a call once it is complete. If they do not receive a call within 24 hours they can check with their pharmacy or our office.

## 2019-10-07 NOTE — Telephone Encounter (Signed)
Medication not on med list please advise

## 2019-10-07 NOTE — Telephone Encounter (Signed)
PATIENT'S WIFE CALLED STATED THAT PATIENT HASN'T RECEIVED HIS MED & THAT SHE HAS SPOKE  TO : [facility]  & THEIR ORDER WILL BE DELIVERED LATE & SUGGESTED  THEY CALL THE PROVIDER & ASK FOR A 2 WEEK SUPPLY TO BE SENT TO LOCAL Rx UNTIL MED'S ARRIVE

## 2019-10-09 ENCOUNTER — Encounter: Payer: Self-pay | Admitting: Family Medicine

## 2019-10-09 ENCOUNTER — Ambulatory Visit (INDEPENDENT_AMBULATORY_CARE_PROVIDER_SITE_OTHER): Admitting: Family Medicine

## 2019-10-09 ENCOUNTER — Other Ambulatory Visit: Payer: Self-pay

## 2019-10-09 VITALS — BP 127/87 | HR 86 | Temp 95.7°F | Ht 72.0 in | Wt 243.2 lb

## 2019-10-09 DIAGNOSIS — M542 Cervicalgia: Secondary | ICD-10-CM | POA: Diagnosis not present

## 2019-10-09 MED ORDER — PREDNISONE 10 MG (21) PO TBPK: ORAL_TABLET | ORAL | 0 refills | Status: DC

## 2019-10-09 MED ORDER — METHOCARBAMOL 500 MG PO TABS: 500.0000 mg | ORAL_TABLET | Freq: Three times a day (TID) | ORAL | 0 refills | Status: DC | PRN

## 2019-10-09 NOTE — Patient Instructions (Signed)

## 2019-10-09 NOTE — Progress Notes (Signed)
Assessment & Plan:  1. Acute neck pain - Discussed with patient not to take Robaxin and Flexeril but that he could pick one.  Exercises provided for him to do for his neck.  Patient given a work note. - predniSONE (STERAPRED UNI-PAK 21 TAB) 10 MG (21) TBPK tablet; As directed x 6 days  Dispense: 21 tablet; Refill: 0 - methocarbamol (ROBAXIN) 500 MG tablet; Take 1 tablet (500 mg total) by mouth every 8 (eight) hours as needed for muscle spasms.  Dispense: 30 tablet; Refill: 0   Follow up plan: Return if symptoms worsen or fail to improve.  Hendricks Limes, MSN, APRN, FNP-C [facility]  Subjective:   Patient ID: Adam Oneal, male    DOB: 05-23-1961, 59 y.o.   MRN: [id number]  HPI: Adam Oneal is a 59 y.o. male presenting on 10/09/2019 for Neck Pain (x 6 days- Patietnt states that he was using his new chain saw and then he started to have neck pain )  Patient reports left-sided neck pain that started 6 days ago after using his new electric chainsaw.  He was also using his left arm to throw the limbs as he did not want to hurt his right arm because that is the thigh he works with at the [employer].  Patient does have a prescription for Celebrex 100 mg twice daily that he has been taking as prescribed.  He also has a prescription for Flexeril but is only able to take it at night because it makes him sleepy.   ROS: Negative unless specifically indicated above in HPI.   Relevant past medical history reviewed and updated as indicated.   Allergies and medications reviewed and updated.   Current Outpatient Medications:  .  celecoxib (CELEBREX) 100 MG capsule, Take 1 capsule (100 mg total) by mouth 2 (two) times daily., Disp: 60 capsule, Rfl: 0 .  cetirizine (ZYRTEC) 10 MG tablet, Take 1 tablet (10 mg total) by mouth daily., Disp: 90 tablet, Rfl: 3 .  cyclobenzaprine (FLEXERIL) 10 MG tablet, Take 1 tablet (10 mg total) by mouth 3 (three) times daily as needed for  muscle spasms., Disp: 270 tablet, Rfl: 3 .  DULoxetine (CYMBALTA) 60 MG capsule, Take 1 capsule (60 mg total) by mouth daily., Disp: 30 capsule, Rfl: 0 .  lisinopril (ZESTRIL) 10 MG tablet, TAKE 1 TABLET DAILY, Disp: 90 tablet, Rfl: 3 .  lithium carbonate (ESKALITH) 450 MG CR tablet, Take 2 tablets (900 mg total) by mouth at bedtime., Disp: 60 tablet, Rfl: 0 .  tamsulosin (FLOMAX) 0.4 MG CAPS capsule, TAKE 1 CAPSULE DAILY, Disp: 90 capsule, Rfl: 0 .  methocarbamol (ROBAXIN) 500 MG tablet, Take 1 tablet (500 mg total) by mouth every 8 (eight) hours as needed for muscle spasms., Disp: 30 tablet, Rfl: 0 .  predniSONE (STERAPRED UNI-PAK 21 TAB) 10 MG (21) TBPK tablet, As directed x 6 days, Disp: 21 tablet, Rfl: 0  Allergies  Allergen Reactions  . Lipitor [Atorvastatin] Shortness Of Breath    Objective:   BP 127/87   Pulse 86   Temp (!) 95.7 F (35.4 C) (Temporal)   Ht 6' (1.829 m)   Wt 243 lb 3.2 oz (110.3 kg)   SpO2 98%   BMI 32.98 kg/m    Physical Exam Vitals reviewed.  Constitutional:      General: He is not in acute distress.    Appearance: Normal appearance. He is obese. He is not ill-appearing, toxic-appearing or diaphoretic.  HENT:     Head: Normocephalic and atraumatic.  Eyes:     General: No scleral icterus.       Right eye: No discharge.        Left eye: No discharge.     Conjunctiva/sclera: Conjunctivae normal.  Cardiovascular:     Rate and Rhythm: Normal rate.  Pulmonary:     Effort: Pulmonary effort is normal. No respiratory distress.  Musculoskeletal:     Cervical back: No erythema, signs of trauma or torticollis. Pain with movement and muscular tenderness present. No spinous process tenderness.  Skin:    General: Skin is warm and dry.  Neurological:     Mental Status: He is alert and oriented to person, place, and time. Mental status is at baseline.  Psychiatric:        Mood and Affect: Mood normal.        Behavior: Behavior normal.        Thought Content:  Thought content normal.        Judgment: Judgment normal.

## 2019-10-12 ENCOUNTER — Encounter: Payer: Self-pay | Admitting: Family Medicine

## 2019-10-13 NOTE — Telephone Encounter (Signed)
Phone number out of order

## 2019-10-13 NOTE — Telephone Encounter (Signed)
Patient had office visit on 10-09-19 with provider here.

## 2019-10-15 ENCOUNTER — Ambulatory Visit: Payer: Self-pay

## 2019-10-27 ENCOUNTER — Other Ambulatory Visit (HOSPITAL_COMMUNITY): Payer: Self-pay | Admitting: Psychiatry

## 2019-11-02 ENCOUNTER — Encounter: Payer: Self-pay | Admitting: Family Medicine

## 2019-11-02 ENCOUNTER — Telehealth (INDEPENDENT_AMBULATORY_CARE_PROVIDER_SITE_OTHER): Admitting: Family Medicine

## 2019-11-02 DIAGNOSIS — F410 Panic disorder [episodic paroxysmal anxiety] without agoraphobia: Secondary | ICD-10-CM | POA: Diagnosis not present

## 2019-11-02 DIAGNOSIS — F32A Depression, unspecified: Secondary | ICD-10-CM

## 2019-11-02 DIAGNOSIS — F419 Anxiety disorder, unspecified: Secondary | ICD-10-CM

## 2019-11-02 DIAGNOSIS — F329 Major depressive disorder, single episode, unspecified: Secondary | ICD-10-CM | POA: Diagnosis not present

## 2019-11-02 MED ORDER — HYDROXYZINE HCL 10 MG PO TABS: 10.0000 mg | ORAL_TABLET | Freq: Three times a day (TID) | ORAL | 0 refills | Status: DC | PRN

## 2019-11-02 NOTE — Progress Notes (Signed)
Virtual Visit via telephone Note  I connected with Adam Oneal on 11/02/19 at 1748 by telephone and verified that I am speaking with the correct person using two identifiers. Adam Oneal is currently located at home and no other people are currently with her during visit. The provider, Elige Radon Karem Tomaso, MD is located in their office at time of visit.  Call ended at 1755  I discussed the limitations, risks, security and privacy concerns of performing an evaluation and management service by telephone and the availability of in person appointments. I also discussed with the patient that there may be a patient responsible charge related to this service. The patient expressed understanding and agreed to proceed.   History and Present Illness: Patient is calling in for issues with panic attack that happened when he chose to retire and felt tightness in chest and dizziness that occurred on 1 days ago. He went [facility]. He was [facility] and they evaluate him and he was fine when he got home. He denies any respiratory symptoms. He denies any further symptoms and this occurred yesterday.  He denies any cough or congestion or fevers or chills.   1. Panic attack   2. Anxiety and depression     Outpatient Encounter Medications as of 11/02/2019  Medication Sig  . celecoxib (CELEBREX) 100 MG capsule Take 1 capsule (100 mg total) by mouth 2 (two) times daily.  . cetirizine (ZYRTEC) 10 MG tablet Take 1 tablet (10 mg total) by mouth daily.  . cyclobenzaprine (FLEXERIL) 10 MG tablet Take 1 tablet (10 mg total) by mouth 3 (three) times daily as needed for muscle spasms.  . DULoxetine (CYMBALTA) 60 MG capsule Take 1 capsule (60 mg total) by mouth daily.  . hydrOXYzine (ATARAX/VISTARIL) 10 MG tablet Take 1 tablet (10 mg total) by mouth 3 (three) times daily as needed.  Marland Kitchen lisinopril (ZESTRIL) 10 MG tablet TAKE 1 TABLET DAILY  . lithium carbonate (ESKALITH) 450 MG CR tablet TAKE 2 TABLETS AT  BEDTIME  . methocarbamol (ROBAXIN) 500 MG tablet Take 1 tablet (500 mg total) by mouth every 8 (eight) hours as needed for muscle spasms.  . predniSONE (STERAPRED UNI-PAK 21 TAB) 10 MG (21) TBPK tablet As directed x 6 days  . tamsulosin (FLOMAX) 0.4 MG CAPS capsule TAKE 1 CAPSULE DAILY   No facility-administered encounter medications on file as of 11/02/2019.    Review of Systems  Constitutional: Negative for chills and fever.  Respiratory: Negative for shortness of breath and wheezing.   Cardiovascular: Negative for chest pain and leg swelling.  Skin: Negative for rash.  Psychiatric/Behavioral: Negative for decreased concentration, dysphoric mood, self-injury, sleep disturbance and suicidal ideas. The patient is nervous/anxious.   All other systems reviewed and are negative.   Observations/Objective: Patient sounds comfortable and in no acute distress  Assessment and Plan: Problem List Items Addressed This Visit      Other   Anxiety and depression   Relevant Medications   hydrOXYzine (ATARAX/VISTARIL) 10 MG tablet    Other Visit Diagnoses    Panic attack    -  Primary   Relevant Medications   hydrOXYzine (ATARAX/VISTARIL) 10 MG tablet       Follow up plan: Return if symptoms worsen or fail to improve.     I discussed the assessment and treatment plan with the patient. The patient was provided an opportunity to ask questions and all were answered. The patient agreed with the plan and  demonstrated an understanding of the instructions.   The patient was advised to call back or seek an in-person evaluation if the symptoms worsen or if the condition fails to improve as anticipated.  The above assessment and management plan was discussed with the patient. The patient verbalized understanding of and has agreed to the management plan. Patient is aware to call the clinic if symptoms persist or worsen. Patient is aware when to return to the clinic for a follow-up visit. Patient  educated on when it is appropriate to go to the emergency department.    I provided 7 minutes of non-face-to-face time during this encounter.    Worthy Rancher, MD

## 2019-11-05 ENCOUNTER — Telehealth: Payer: Self-pay | Admitting: Family Medicine

## 2019-11-05 NOTE — Telephone Encounter (Signed)
Yes that is fine to extend the work note until 11/06/2019 from last week.

## 2019-11-05 NOTE — Telephone Encounter (Signed)
Ok to extend note? Please advise

## 2019-11-05 NOTE — Telephone Encounter (Signed)
Left detailed message on patients voicemail and letter sent to Mychart

## 2019-11-21 ENCOUNTER — Other Ambulatory Visit: Payer: Self-pay | Admitting: Family Medicine

## 2019-12-01 ENCOUNTER — Telehealth (INDEPENDENT_AMBULATORY_CARE_PROVIDER_SITE_OTHER): Admitting: Psychiatry

## 2019-12-01 ENCOUNTER — Other Ambulatory Visit: Payer: Self-pay

## 2019-12-01 ENCOUNTER — Encounter (HOSPITAL_COMMUNITY): Payer: Self-pay | Admitting: Psychiatry

## 2019-12-01 DIAGNOSIS — F3162 Bipolar disorder, current episode mixed, moderate: Secondary | ICD-10-CM | POA: Diagnosis not present

## 2019-12-01 MED ORDER — DULOXETINE HCL 60 MG PO CPEP: 60.0000 mg | ORAL_CAPSULE | Freq: Every day | ORAL | 0 refills | Status: DC

## 2019-12-01 MED ORDER — LITHIUM CARBONATE ER 450 MG PO TBCR: 900.0000 mg | EXTENDED_RELEASE_TABLET | Freq: Every day | ORAL | 3 refills | Status: DC

## 2019-12-01 MED ORDER — HYDROXYZINE HCL 25 MG PO TABS: 25.0000 mg | ORAL_TABLET | Freq: Three times a day (TID) | ORAL | 2 refills | Status: DC | PRN

## 2019-12-01 NOTE — Progress Notes (Signed)
Virtual Visit via Telephone Note  I connected with Adam Oneal on 12/01/19 at 11:40 AM EDT by telephone and verified that I am speaking with the correct person using two identifiers.   I discussed the limitations, risks, security and privacy concerns of performing an evaluation and management service by telephone and the availability of in person appointments. I also discussed with the patient that there may be a patient responsible charge related to this service. The patient expressed understanding and agreed to proceed.    I discussed the assessment and treatment plan with the patient. The patient was provided an opportunity to ask questions and all were answered. The patient agreed with the plan and demonstrated an understanding of the instructions.   The patient was advised to call back or seek an in-person evaluation if the symptoms worsen or if the condition fails to improve as anticipated.  I provided 15 minutes of non-face-to-face time during this encounter.   Levonne Spiller, MD  [facility] MD/PA/NP OP Progress Note  12/01/2019 11:55 AM Adam Oneal  MRN:  [id number]  Chief Complaint:  Chief Complaint    Anxiety; Manic Behavior; Follow-up     HPI: This patient is a59 year old married white male who lives with his wife in [place]. 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 The couple have no children   The patient was referred by his primary care provider from [facility] for further assessment and treatment of bipolar disorder.  The patient is somewhat of a vague historian and isn't quite sure how he got diagnosed with bipolar disorder. He states that he has had "mood swings" all his life. He went into the [employer] at 59 years of age and got out 64 years later. He states that his mood was up and down throughout the whole time but he dealt with that by drinking quite a bit of alcohol as well as caffeine during the day. 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 His moods are "up and down" he states that at times he does have symptoms of overt mania such as inability to sleep and hypersexuality.  In the fall and winter he becomes more depressed and tired with low energy and motivation. He has been on lithium for a number of years in his current level is 0.6. Since he moved here he has had to work the night shift for the first time in his life. He has difficulty sleeping during the day and also doesn't remember to take off his medications. He states that he tends to obsess and wants his house spotlessly clean. At times he rants and raves about situations at work but would never really hurt anybody or himself. He's never had psychiatric inpatient treatment. His wife describes him as "very anxious" and unhappy with his job. He calls in sick about once a month. He has no friends and doesn't really trust other people. 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  I had also spoken to his wife and she states he is very anxious again before having to go to work.  He actually had a panic attack for which she had to go to urgent care.  His primary doctor gave him hydroxyzine 10 mg but it has not  really helped.  He states he had a run in with a coworker and this is what brought on the anxiety but things have been straightened out since and he is doing better.  He still does not really like going into work that much but only has about a year and a half left to go till he can retire so he is going to try to stick it out.  He states his mood is generally good and he is sleeping well and he denies thoughts of suicidal ideation.  We have checked his lithium level and a mildly need to do this and I also suggested a higher dose of hydroxyzine for his anxiety and he agrees.  When we tried  clonazepam he became very agitated in the past Visit Diagnosis:    ICD-10-CM   1. Bipolar 1 disorder, mixed, moderate (HCC)  F31.62     Past Psychiatric History: Long-term outpatient treatment for bipolar disorder  Past Medical History:  Past Medical History:  Diagnosis Date  . Acid reflux   . Anxiety 01/1998  . Cataract 02/2004  . Depression 01/1998  . Diabetes mellitus without complication ([place]) 14/4315  . Diabetes mellitus, type II ([place])   . Hyperlipidemia 11/2006  . Hypertension   . Hypertension    History reviewed. No pertinent surgical history.  Family Psychiatric History: See below  Family History:  Family History  Problem Relation Age of Onset  . Cancer Mother 26       lung  . Heart disease Father   . Hyperlipidemia Father   . Diabetes Brother   . Leukemia Brother   . Diabetes Paternal Grandmother   . Heart disease Paternal Grandmother   . Alcohol abuse Paternal Grandfather   . Diabetes Brother   . Depression Maternal Aunt     Social History:  Social History   Socioeconomic History  . Marital status: Married    Spouse name: Not on file  . Number of children: Not on file  . Years of education: Not on file  . Highest education level: Not on file  Occupational History  . Not on file  Tobacco Use  . Smoking status: Former Smoker    Packs/day: 1.00    Years: 12.00    Pack years: 12.00    Types: Cigarettes    Start date: 01/05/1978    Quit date: 12/28/1989    Years since quitting: 29.9  . Smokeless tobacco: Former Network engineer and Sexual Activity  . Alcohol use: No    Alcohol/week: 0.0 standard drinks    Comment: 03-26-16 per pt no  . Drug use: No    Comment: 03-26-16 per pt currently no but use to  . Sexual activity: Never  Other Topics Concern  . Not on file  Social History Narrative  . Not on file   Social Determinants of Health   Financial Resource Strain:   . Difficulty of Paying Living Expenses:   Food Insecurity:   . Worried About  Charity fundraiser in the Last Year:   . Arboriculturist in the Last Year:   Transportation Needs:   . Film/video editor (Medical):   Marland Kitchen Lack of Transportation (Non-Medical):   Physical Activity:   . Days of Exercise per Week:   . Minutes of Exercise per Session:   Stress:   . Feeling of Stress :   Social Connections:   . Frequency of Communication with Friends and Family:   . Frequency  of Social Gatherings with Friends and Family:   . Attends Religious Services:   . Active Member of Clubs or Organizations:   . Attends Archivist Meetings:   Marland Kitchen Marital Status:     Allergies:  Allergies  Allergen Reactions  . Lipitor [Atorvastatin] Shortness Of Breath    Metabolic Disorder Labs: Lab Results  Component Value Date   HGBA1C 6.2 04/10/2018   No results found for: PROLACTIN Lab Results  Component Value Date   CHOL 147 04/10/2018   TRIG 81 04/10/2018   HDL 58 04/10/2018   CHOLHDL 2.5 04/10/2018   LDLCALC 73 04/10/2018   LDLCALC 70 07/02/2017   Lab Results  Component Value Date   TSH 0.96 02/09/2019   TSH 0.893 04/10/2018    Therapeutic Level Labs: Lab Results  Component Value Date   LITHIUM 0.3 (L) 02/09/2019   LITHIUM 0.4 (L) 04/10/2018   No results found for: VALPROATE No components found for:  CBMZ  Current Medications: Current Outpatient Medications  Medication Sig Dispense Refill  . celecoxib (CELEBREX) 100 MG capsule Take 1 capsule (100 mg total) by mouth 2 (two) times daily. 60 capsule 0  . cetirizine (ZYRTEC) 10 MG tablet Take 1 tablet (10 mg total) by mouth daily. 90 tablet 3  . cyclobenzaprine (FLEXERIL) 10 MG tablet Take 1 tablet (10 mg total) by mouth 3 (three) times daily as needed for muscle spasms. 270 tablet 3  . DULoxetine (CYMBALTA) 60 MG capsule Take 1 capsule (60 mg total) by mouth daily. 30 capsule 0  . hydrOXYzine (ATARAX/VISTARIL) 25 MG tablet Take 1 tablet (25 mg total) by mouth every 8 (eight) hours as needed for anxiety.  30 tablet 2  . lisinopril (ZESTRIL) 10 MG tablet TAKE 1 TABLET DAILY 90 tablet 3  . lithium carbonate (ESKALITH) 450 MG CR tablet Take 2 tablets (900 mg total) by mouth at bedtime. 180 tablet 3  . methocarbamol (ROBAXIN) 500 MG tablet Take 1 tablet (500 mg total) by mouth every 8 (eight) hours as needed for muscle spasms. 30 tablet 0  . predniSONE (STERAPRED UNI-PAK 21 TAB) 10 MG (21) TBPK tablet As directed x 6 days 21 tablet 0  . tamsulosin (FLOMAX) 0.4 MG CAPS capsule TAKE 1 CAPSULE DAILY 90 capsule 0   No current facility-administered medications for this visit.     Musculoskeletal: Strength & Muscle Tone: within normal limits Gait & Station: normal Patient leans: N/A  Psychiatric Specialty Exam: Review of Systems  Psychiatric/Behavioral: The patient is nervous/anxious.   All other systems reviewed and are negative.   There were no vitals taken for this visit.There is no height or weight on file to calculate BMI.  General Appearance: NA  Eye Contact:  NA  Speech:  Clear and Coherent  Volume:  Normal  Mood:  Anxious  Affect:  NA  Thought Process:  Goal Directed  Orientation:  Full (Time, Place, and Person)  Thought Content: Rumination   Suicidal Thoughts:  No  Homicidal Thoughts:  No  Memory:  Immediate;   Good Recent;   Good Remote;   Fair  Judgement:  Good  Insight:  Fair  Psychomotor Activity:  Normal  Concentration:  Concentration: Good and Attention Span: Good  Recall:  Good  Fund of Knowledge: Good  Language: Good  Akathisia:  No  Handed:  Right  AIMS (if indicated): not done  Assets:  Communication Skills Desire for Improvement Physical Health Resilience Social Support Talents/Skills  ADL's:  Intact  Cognition: WNL  Sleep:  Good   Screenings: PHQ2-9     Office Visit from 10/09/2019 in [facility] Visit from 08/17/2019 in [facility] Visit from 05/07/2019 in [facility] Visit from 04/10/2018 in [facility] Visit from 09/10/2017 in [facility]  PHQ-2 Total Score  0  0  0  0  0       Assessment and Plan: Patient is a 59 year old male with a history of bipolar disorder.  For now he is doing fairly well although he has again become anxious about work.  We will try hydroxyzine 25 mg as needed for anxiety.  He will continue lithium carbonate 900 mg daily and Cymbalta 60 mg daily for mood stabilization.  We will check his lithium level and other appropriate laboratories.  He will return to see me in 3 months   Levonne Spiller, MD 12/01/2019, 11:55 AM

## 2019-12-23 ENCOUNTER — Other Ambulatory Visit: Payer: Self-pay

## 2019-12-23 ENCOUNTER — Ambulatory Visit: Admitting: Family Medicine

## 2020-01-20 ENCOUNTER — Ambulatory Visit (INDEPENDENT_AMBULATORY_CARE_PROVIDER_SITE_OTHER): Admitting: Physician Assistant

## 2020-01-20 ENCOUNTER — Other Ambulatory Visit: Payer: Self-pay

## 2020-01-20 ENCOUNTER — Encounter: Payer: Self-pay | Admitting: Physician Assistant

## 2020-01-20 VITALS — BP 132/81 | HR 64 | Temp 97.4°F | Ht 72.0 in | Wt 251.2 lb

## 2020-01-20 DIAGNOSIS — L723 Sebaceous cyst: Secondary | ICD-10-CM

## 2020-01-20 NOTE — Patient Instructions (Signed)
Epidermal Cyst Removal, Care After This sheet gives you information about how to care for yourself after your procedure. Your health care provider may also give you more specific instructions. If you have problems or questions, contact your health care provider. What can I expect after the procedure? After the procedure, it is common to have:  Soreness in the area where your cyst was removed.  Tightness or itchiness from the stitches (sutures) in your skin. Follow these instructions at home: Medicines  Take over-the-counter and prescription medicines only as told by your health care provider.  If you were prescribed an antibiotic medicine or ointment, take or apply it as told by your health care provider. Do not stop using the antibiotic even if you start to feel better. Incision care   Follow instructions from your health care provider about how to take care of your incision. Make sure you: ? Wash your hands with soap and water before you change your bandage (dressing). If soap and water are not available, use hand sanitizer. ? Change your dressing as told by your health care provider. ? Leave sutures, skin glue, or adhesive strips in place. These skin closures may need to stay in place for 1-2 weeks or longer. If adhesive strip edges start to loosen and curl up, you may trim the loose edges. Do not remove adhesive strips completely unless your health care provider tells you to do that.  Keep the dressingdry until your health care provider says that it can be removed.  After your dressing is off, check your incision area every day for signs of infection. Check for: ? Redness, swelling, or pain. ? Fluid or blood. ? Warmth. ? Pus or a bad smell. General instructions  Do not take baths, swim, or use a hot tub until your health care provider approves. Ask your health care provider if you may take showers. You may only be allowed to take sponge baths.  Your health care provider may ask  you to avoid contact sports or activities that take a lot of effort. Do not do anything that stretches or puts pressure on your incision.  You can return to your normal diet.  Keep all follow-up visits as told by your health care provider. This is important. Contact a health care provider if:  You have a fever.  You have redness, swelling, or pain in the incision area.  You have fluid or blood coming from your incision.  You have pus or a bad smell coming from your incision.  Your incision feels warm to the touch.  Your cyst grows back. Summary  After the procedure, it is common to have soreness in the area where your cyst was removed.  Take or apply over-the-counter and prescription medicines only as told by your health care provider.  Follow instructions from your health care provider about how to take care of your incision. This information is not intended to replace advice given to you by your health care provider. Make sure you discuss any questions you have with your health care provider. Document Revised: 11/05/2017 Document Reviewed: 05/09/2017 Elsevier Patient Education  2020 Elsevier Inc[employer]  

## 2020-01-20 NOTE — Progress Notes (Signed)
  Subjective:     Patient ID: Adam Oneal, male   DOB: [birth year], 59 y.o.   MRN: [id number]  HPI Pt  With side to the R side of the neck Hx of same in the past with I&D Sl increase in size and pain No recent drainage from the area  Review of Systems  Constitutional: Negative.   Skin: Positive for wound. Negative for color change, pallor and rash.       Objective:   Physical Exam Vitals and nursing note reviewed.  Constitutional:      General: He is not in acute distress.    Appearance: Normal appearance. He is not toxic-appearing.  Neurological:     Mental Status: He is alert.   1.5x1.5 cm cyst area tot he R lateral mid neck area + area of fluct No surrounding erythema, induration, or drainage noted Minimal TTP No ant or posterior nodes palp Offered I&D vs Observation Pt consented to I&D Process reviewed Area cleansed with Betadine Anesth with 1 cc lido w/epi Small incision made Old cystic material expressed No purulent material seen Area packed Wound cleansed and dressed     Assessment:     1. Inflamed sebaceous cyst         Plan:     Wound care reviewed S/S of infection reviewed Reoccurrence discussed Keep area clean and dry F/U prn

## 2020-02-03 ENCOUNTER — Other Ambulatory Visit: Payer: Self-pay | Admitting: Family Medicine

## 2020-02-03 DIAGNOSIS — I10 Essential (primary) hypertension: Secondary | ICD-10-CM

## 2020-02-03 MED ORDER — LISINOPRIL 10 MG PO TABS: 10.0000 mg | ORAL_TABLET | Freq: Every day | ORAL | 0 refills | Status: DC

## 2020-02-03 NOTE — Telephone Encounter (Signed)
Aware refill sent to pharmacy ?

## 2020-02-03 NOTE — Telephone Encounter (Signed)
  Prescription Request  02/03/2020  What is the name of the medication or equipment? Lisinopril 10 mg. Patient has appt 8-18 with [facility] and needs a 30 day supply. Patient has four left  Have you contacted your pharmacy to request a refill? (if applicable) NO  Which pharmacy would you like this sent to? [facility]   Patient notified that their request is being sent to the clinical staff for review and that they should receive a response within 2 business days.

## 2020-02-18 ENCOUNTER — Other Ambulatory Visit: Payer: Self-pay | Admitting: Family Medicine

## 2020-02-22 ENCOUNTER — Encounter: Payer: Self-pay | Admitting: Family Medicine

## 2020-02-22 MED ORDER — TAMSULOSIN HCL 0.4 MG PO CAPS: 0.4000 mg | ORAL_CAPSULE | Freq: Every day | ORAL | 0 refills | Status: DC

## 2020-03-16 ENCOUNTER — Ambulatory Visit: Admitting: Family Medicine

## 2020-03-22 ENCOUNTER — Encounter (HOSPITAL_COMMUNITY): Payer: Self-pay | Admitting: Psychiatry

## 2020-03-22 ENCOUNTER — Telehealth (INDEPENDENT_AMBULATORY_CARE_PROVIDER_SITE_OTHER): Admitting: Psychiatry

## 2020-03-22 ENCOUNTER — Other Ambulatory Visit: Payer: Self-pay

## 2020-03-22 DIAGNOSIS — F3162 Bipolar disorder, current episode mixed, moderate: Secondary | ICD-10-CM | POA: Diagnosis not present

## 2020-03-22 MED ORDER — HYDROXYZINE HCL 25 MG PO TABS: 25.0000 mg | ORAL_TABLET | Freq: Three times a day (TID) | ORAL | 2 refills | Status: DC | PRN

## 2020-03-22 MED ORDER — DULOXETINE HCL 60 MG PO CPEP: 60.0000 mg | ORAL_CAPSULE | Freq: Every day | ORAL | 0 refills | Status: DC

## 2020-03-22 MED ORDER — LITHIUM CARBONATE ER 450 MG PO TBCR: 900.0000 mg | EXTENDED_RELEASE_TABLET | Freq: Every day | ORAL | 3 refills | Status: DC

## 2020-03-22 NOTE — Progress Notes (Signed)
Virtual Visit via Telephone Note  I connected with Adam Oneal on 03/22/20 at  9:40 AM EDT by telephone and verified that I am speaking with the correct person using two identifiers.   I discussed the limitations, risks, security and privacy concerns of performing an evaluation and management service by telephone and the availability of in person appointments. I also discussed with the patient that there may be a patient responsible charge related to this service. The patient expressed understanding and agreed to proceed.    I discussed the assessment and treatment plan with the patient. The patient was provided an opportunity to ask questions and all were answered. The patient agreed with the plan and demonstrated an understanding of the instructions.   The patient was advised to call back or seek an in-person evaluation if the symptoms worsen or if the condition fails to improve as anticipated.  I provided 15 minutes of non-face-to-face time during this encounter. Location: Provider office, patient home  Levonne Spiller, MD  [facility] MD/PA/NP OP Progress Note  03/22/2020 9:50 AM Adam Oneal  MRN:  [id number]  Chief Complaint:  Chief Complaint    Manic Behavior; Depression; Anxiety; Follow-up     HPI: This patient is a59 year old married white male who lives with his wife in [place]. 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 The couple have no children   The patient was referred by his primary care provider from [facility] for further assessment and treatment of bipolar disorder.  The patient is somewhat of a vague historian and isn't quite sure how he got diagnosed with bipolar disorder. He states that he has had "mood swings" all his life. He went into the [employer] at 59 years of age and got out 66 years later. He states that his mood was up and down throughout the whole time but he dealt with that by drinking quite a bit of alcohol as  well as caffeine during the day. 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 His moods are "up and down" he states that at times he does have symptoms of overt mania such as inability to sleep and hypersexuality.  In the fall and winter he becomes more depressed and tired with low energy and motivation. He has been on lithium for a number of years in his current level is 0.6. Since he moved here he has had to work the night shift for the first time in his life. He has difficulty sleeping during the day and also doesn't remember to take off his medications. He states that he tends to obsess and wants his house spotlessly clean. At times he rants and raves about situations at work but would never really hurt anybody or himself. He's never had psychiatric inpatient treatment. His wife describes him as "very anxious" and unhappy with his job. He calls in sick about once a month. He has no friends and doesn't really trust other people. 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  In general he states he is doing well.  His mood has been fairly stable.  He has made the decision to retire in a year from next month.  He had his wife want to do some traveling.  He states this is  alleviated some stress in his mind.  He is no longer quite as anxious when he goes into work and the hydroxyzine helps.  His recent lithium level was 0.3 but he claims he is compliant and his mood is stable.  His blood sugar was 243 and he is going to address this with his family doctor.  TSH was normal.  For the most part he is sleeping well and he denies any racing thoughts or other manic symptoms. Visit Diagnosis:    ICD-10-CM   1. Bipolar 1 disorder, mixed, moderate (HCC)  F31.62     Past Psychiatric History: Long-term outpatient treatment for bipolar disorder  Past  Medical History:  Past Medical History:  Diagnosis Date  . Acid reflux   . Anxiety 01/1998  . Cataract 02/2004  . Depression 01/1998  . Diabetes mellitus without complication ([place]) 15/1761  . Diabetes mellitus, type II ([place])   . Hyperlipidemia 11/2006  . Hypertension   . Hypertension    History reviewed. No pertinent surgical history.  Family Psychiatric History: see below  Family History:  Family History  Problem Relation Age of Onset  . Cancer Mother 32       lung  . Heart disease Father   . Hyperlipidemia Father   . Diabetes Brother   . Leukemia Brother   . Diabetes Paternal Grandmother   . Heart disease Paternal Grandmother   . Alcohol abuse Paternal Grandfather   . Diabetes Brother   . Depression Maternal Aunt     Social History:  Social History   Socioeconomic History  . Marital status: Married    Spouse name: Not on file  . Number of children: Not on file  . Years of education: Not on file  . Highest education level: Not on file  Occupational History  . Not on file  Tobacco Use  . Smoking status: Former Smoker    Packs/day: 1.00    Years: 12.00    Pack years: 12.00    Types: Cigarettes    Start date: 01/05/1978    Quit date: 12/28/1989    Years since quitting: 30.2  . Smokeless tobacco: Former Network engineer  . Vaping Use: Never used  Substance and Sexual Activity  . Alcohol use: No    Alcohol/week: 0.0 standard drinks    Comment: 03-26-16 per pt no  . Drug use: No    Comment: 03-26-16 per pt currently no but use to  . Sexual activity: Never  Other Topics Concern  . Not on file  Social History Narrative  . Not on file   Social Determinants of Health   Financial Resource Strain:   . Difficulty of Paying Living Expenses: Not on file  Food Insecurity:   . Worried About Charity fundraiser in the Last Year: Not on file  . Ran Out of Food in the Last Year: Not on file  Transportation Needs:   . Lack of Transportation (Medical): Not on file  .  Lack of Transportation (Non-Medical): Not on file  Physical Activity:   . Days of Exercise per Week: Not on file  . Minutes of Exercise per Session: Not on file  Stress:   . Feeling of Stress : Not on file  Social Connections:   . Frequency of Communication with Friends and Family: Not on file  . Frequency of Social Gatherings with Friends and Family: Not on file  . Attends Religious Services: Not on file  . Active Member of Clubs  or Organizations: Not on file  . Attends Archivist Meetings: Not on file  . Marital Status: Not on file    Allergies:  Allergies  Allergen Reactions  . Lipitor [Atorvastatin] Shortness Of Breath    Metabolic Disorder Labs: Lab Results  Component Value Date   HGBA1C 6.2 04/10/2018   No results found for: PROLACTIN Lab Results  Component Value Date   CHOL 147 04/10/2018   TRIG 81 04/10/2018   HDL 58 04/10/2018   CHOLHDL 2.5 04/10/2018   LDLCALC 73 04/10/2018   LDLCALC 70 07/02/2017   Lab Results  Component Value Date   TSH 0.96 02/09/2019   TSH 0.893 04/10/2018    Therapeutic Level Labs: Lab Results  Component Value Date   LITHIUM 0.3 (L) 02/09/2019   LITHIUM 0.4 (L) 04/10/2018   No results found for: VALPROATE No components found for:  CBMZ  Current Medications: Current Outpatient Medications  Medication Sig Dispense Refill  . cetirizine (ZYRTEC) 10 MG tablet Take 1 tablet (10 mg total) by mouth daily. (Patient not taking: Reported on 01/20/2020) 90 tablet 3  . cyclobenzaprine (FLEXERIL) 10 MG tablet Take 1 tablet (10 mg total) by mouth 3 (three) times daily as needed for muscle spasms. 270 tablet 3  . DULoxetine (CYMBALTA) 60 MG capsule Take 1 capsule (60 mg total) by mouth daily. 90 capsule 0  . hydrOXYzine (ATARAX/VISTARIL) 25 MG tablet Take 1 tablet (25 mg total) by mouth every 8 (eight) hours as needed for anxiety. 30 tablet 2  . lisinopril (ZESTRIL) 10 MG tablet Take 1 tablet (10 mg total) by mouth daily. 30 tablet 0   . lithium carbonate (ESKALITH) 450 MG CR tablet Take 2 tablets (900 mg total) by mouth at bedtime. 180 tablet 3  . tamsulosin (FLOMAX) 0.4 MG CAPS capsule Take 1 capsule (0.4 mg total) by mouth daily. 30 capsule 0   No current facility-administered medications for this visit.     Musculoskeletal: Strength & Muscle Tone: within normal limits Gait & Station: normal Patient leans: N/A  Psychiatric Specialty Exam: Review of Systems  Musculoskeletal: Positive for arthralgias.  All other systems reviewed and are negative.   There were no vitals taken for this visit.There is no height or weight on file to calculate BMI.  General Appearance: NA  Eye Contact:  NA  Speech:  Clear and Coherent  Volume:  Normal  Mood:  Euthymic  Affect:  NA  Thought Process:  Goal Directed  Orientation:  Full (Time, Place, and Person)  Thought Content: WDL   Suicidal Thoughts:  No  Homicidal Thoughts:  No  Memory:  Immediate;   Good Recent;   Good Remote;   Fair  Judgement:  Good  Insight:  Good  Psychomotor Activity:  Normal  Concentration:  Concentration: Good and Attention Span: Good  Recall:  Good  Fund of Knowledge: Good  Language: Good  Akathisia:  No  Handed:  Right  AIMS (if indicated): not done  Assets:  Communication Skills Desire for Improvement Resilience Social Support Talents/Skills  ADL's:  Intact  Cognition: WNL  Sleep:  Good   Screenings: PHQ2-9     Office Visit from 01/20/2020 in [place] Office Visit from 10/09/2019 in [place] Office Visit from 08/17/2019 in [place] Office Visit from 05/07/2019 in [place] Visit from 04/10/2018 in [place]  PHQ-2 Total Score 0 0 0 0 0  Assessment and Plan: This patient is a 59 year old male with a history of bipolar disorder.  For the most part he is doing well now he has decided to retire.  He  will continue hydroxyzine 25 mg as needed for anxiety, lithium carbonate 900 mg daily and Cymbalta 60 mg daily for mood stabilization.  He is going to address the elevated blood sugar with his PCP.  I also warned her about combining nonsteroidals with lithium and urged him to use Tylenol or aspirin to deal with pain.  He'll return to see me in 3 months   Levonne Spiller, MD 03/22/2020, 9:50 AM

## 2020-05-03 ENCOUNTER — Telehealth (HOSPITAL_COMMUNITY): Payer: Self-pay | Admitting: *Deleted

## 2020-05-03 ENCOUNTER — Other Ambulatory Visit (HOSPITAL_COMMUNITY): Payer: Self-pay | Admitting: Psychiatry

## 2020-05-03 MED ORDER — LITHIUM CARBONATE ER 450 MG PO TBCR
900.0000 mg | EXTENDED_RELEASE_TABLET | Freq: Every day | ORAL | 3 refills | Status: DC
Start: 2020-05-03 — End: 2020-08-09

## 2020-05-03 MED ORDER — DULOXETINE HCL 60 MG PO CPEP: 60.0000 mg | ORAL_CAPSULE | Freq: Every day | ORAL | 0 refills | Status: DC

## 2020-05-03 NOTE — Telephone Encounter (Signed)
Adam wife called stating that she forgot to call in for Adam Oneal with [facility] and he is now out of his scripts. Per pt wife, she would like for provider to please send 90 days scripts to Adam Oneal.

## 2020-05-03 NOTE — Telephone Encounter (Signed)
sent 

## 2020-05-03 NOTE — Telephone Encounter (Signed)
Patient wife aware and verbalized understanding. 

## 2020-05-31 ENCOUNTER — Telehealth (HOSPITAL_COMMUNITY): Payer: Self-pay | Admitting: *Deleted

## 2020-05-31 NOTE — Telephone Encounter (Signed)
Patient wife called stating patient was out of work on October 28,29 and 30th on his FMLA. Per pt wife, patient went to another provider on the 30th due to it being convenient to him and they wrote him a note. Per pt wife, the note he received stated patient was seen in our facility. Per pt wife, when they submitted the note to pt FMLA, they stated they needed more details then just being seen in that place facility. Per pt wife they would also like to know why patient was out of owrk on October 28th and 29th. Per pt wife, she needs a letter from Dr. Tenny Craw stating that patient was out those 3 days stating that patient was on FMLA due to his anxiety and seasonal effective disorder. Per pt wife, Dr. Tenny Craw was the one that did the paperwork for FMLA and it will need to come from her.   [phone]

## 2020-05-31 NOTE — Telephone Encounter (Signed)
Informed patient wife with what provider stated and she verbalized understanding.

## 2020-05-31 NOTE — Telephone Encounter (Signed)
He was not seen here on those dates. He was seen at urgent care

## 2020-06-04 ENCOUNTER — Other Ambulatory Visit: Payer: Self-pay | Admitting: Family Medicine

## 2020-06-04 DIAGNOSIS — I10 Essential (primary) hypertension: Secondary | ICD-10-CM

## 2020-06-06 NOTE — Telephone Encounter (Signed)
Det[place]er. NTBS OVs this year have been for acute visit. Mail order not sent

## 2020-06-07 NOTE — Telephone Encounter (Signed)
Appointment scheduled.

## 2020-06-15 ENCOUNTER — Ambulatory Visit (INDEPENDENT_AMBULATORY_CARE_PROVIDER_SITE_OTHER): Admitting: Family Medicine

## 2020-06-15 ENCOUNTER — Other Ambulatory Visit: Payer: Self-pay

## 2020-06-15 ENCOUNTER — Encounter: Payer: Self-pay | Admitting: Family Medicine

## 2020-06-15 VITALS — BP 128/87 | HR 77 | Temp 97.6°F | Ht 72.0 in | Wt 239.0 lb

## 2020-06-15 DIAGNOSIS — E785 Hyperlipidemia, unspecified: Secondary | ICD-10-CM

## 2020-06-15 DIAGNOSIS — E1159 Type 2 diabetes mellitus with other circulatory complications: Secondary | ICD-10-CM

## 2020-06-15 DIAGNOSIS — E118 Type 2 diabetes mellitus with unspecified complications: Secondary | ICD-10-CM | POA: Diagnosis not present

## 2020-06-15 DIAGNOSIS — E1169 Type 2 diabetes mellitus with other specified complication: Secondary | ICD-10-CM | POA: Diagnosis not present

## 2020-06-15 DIAGNOSIS — K219 Gastro-esophageal reflux disease without esophagitis: Secondary | ICD-10-CM | POA: Diagnosis not present

## 2020-06-15 DIAGNOSIS — Z23 Encounter for immunization: Secondary | ICD-10-CM

## 2020-06-15 DIAGNOSIS — E119 Type 2 diabetes mellitus without complications: Secondary | ICD-10-CM

## 2020-06-15 DIAGNOSIS — I152 Hypertension secondary to endocrine disorders: Secondary | ICD-10-CM

## 2020-06-15 DIAGNOSIS — I10 Essential (primary) hypertension: Secondary | ICD-10-CM

## 2020-06-15 DIAGNOSIS — N400 Enlarged prostate without lower urinary tract symptoms: Secondary | ICD-10-CM

## 2020-06-15 LAB — BAYER DCA HB A1C WAIVED: HB A1C (BAYER DCA - WAIVED): 10.3 % — ABNORMAL HIGH (ref <7.0)

## 2020-06-15 MED ORDER — METFORMIN HCL 1000 MG PO TABS: 1000.0000 mg | ORAL_TABLET | Freq: Two times a day (BID) | ORAL | 3 refills | Status: DC

## 2020-06-15 MED ORDER — LISINOPRIL 10 MG PO TABS: 10.0000 mg | ORAL_TABLET | Freq: Every day | ORAL | 3 refills | Status: DC

## 2020-06-15 MED ORDER — TAMSULOSIN HCL 0.4 MG PO CAPS: 0.4000 mg | ORAL_CAPSULE | Freq: Every day | ORAL | 3 refills | Status: DC

## 2020-06-15 NOTE — Progress Notes (Signed)
BP 128/87   Pulse 77   Temp 97.6 F (36.4 C)   Ht 6' (1.829 m)   Wt 239 lb (108.4 kg)   SpO2 98%   BMI 32.41 kg/m    Subjective:   Patient ID: Adam Oneal, male    DOB: 1961-02-09, 59 y.o.   MRN: [id number]  HPI: Adam Oneal is a 59 y.o. male presenting on 06/15/2020 for Medical Management of Chronic Issues   HPI Type 2 diabetes mellitus Patient comes in today for recheck of his diabetes. Patient has been currently taking no medication currently but A1c is up to 10.3. Patient is currently on an ACE inhibitor/ARB. Patient has seen an ophthalmologist this year. Patient is complaining of some new tingling in both of his feet. The symptom started onset as an adult hypertension and hyperlipidemia ARE RELATED TO DM.  Patient is also complaining of a lot more frequency of urination.  He does admit that his wife has been having some memory issues and he has been eating a lot worse because of that.  Hypertension Patient is currently on lisinopril, and their blood pressure today is 128/87. Patient denies any lightheadedness or dizziness. Patient denies headaches, blurred vision, chest pains, shortness of breath, or weakness. Denies any side effects from medication and is content with current medication.   Hyperlipidemia Patient is coming in for recheck of his hyperlipidemia. The patient is currently taking no medication currently. They deny any issues with myalgias or history of liver damage from it. They deny any focal numbness or weakness or chest pain.   GERD Patient is currently on no medication currently.  She denies any major symptoms or abdominal pain or belching or burping. She denies any blood in her stool or lightheadedness or dizziness.   Patient sees psychiatry for his bipolar  Relevant past medical, surgical, family and social history reviewed and updated as indicated. Interim medical history since our last visit reviewed. Allergies and medications reviewed and  updated.  Review of Systems  Constitutional: Negative for chills and fever.  Respiratory: Negative for shortness of breath and wheezing.   Cardiovascular: Negative for chest pain and leg swelling.  Genitourinary: Positive for frequency.  Musculoskeletal: Negative for back pain and gait problem.  Skin: Negative for rash.  Neurological: Positive for numbness. Negative for weakness.  All other systems reviewed and are negative.   Per HPI unless specifically indicated above   Allergies as of 06/15/2020      Reactions   Lipitor [atorvastatin] Shortness Of Breath      Medication List       Accurate as of June 15, 2020 11:52 AM. If you have any questions, ask your nurse or doctor.        cetirizine 10 MG tablet Commonly known as: ZYRTEC Take 1 tablet (10 mg total) by mouth daily.   cyclobenzaprine 10 MG tablet Commonly known as: FLEXERIL Take 1 tablet (10 mg total) by mouth 3 (three) times daily as needed for muscle spasms.   DULoxetine 60 MG capsule Commonly known as: Cymbalta Take 1 capsule (60 mg total) by mouth daily.   hydrOXYzine 25 MG tablet Commonly known as: ATARAX/VISTARIL Take 1 tablet (25 mg total) by mouth every 8 (eight) hours as needed for anxiety.   lisinopril 10 MG tablet Commonly known as: ZESTRIL Take 1 tablet (10 mg total) by mouth daily.   lithium carbonate 450 MG CR tablet Commonly known as: ESKALITH Take 2 tablets (900 mg total) by mouth  at bedtime.   metFORMIN 1000 MG tablet Commonly known as: GLUCOPHAGE Take 1 tablet (1,000 mg total) by mouth 2 (two) times daily with a meal. Started by: Fransisca Kaufmann Serenna Deroy, MD   tamsulosin 0.4 MG Caps capsule Commonly known as: FLOMAX Take 1 capsule (0.4 mg total) by mouth daily.        Objective:   BP 128/87   Pulse 77   Temp 97.6 F (36.4 C)   Ht 6' (1.829 m)   Wt 239 lb (108.4 kg)   SpO2 98%   BMI 32.41 kg/m   Wt Readings from Last 3 Encounters:  06/15/20 239 lb (108.4 kg)  01/20/20  251 lb 3.2 oz (113.9 kg)  10/09/19 243 lb 3.2 oz (110.3 kg)    Physical Exam Vitals and nursing note reviewed.  Constitutional:      General: He is not in acute distress.    Appearance: He is well-developed. He is not diaphoretic.  Eyes:     General: No scleral icterus.    Conjunctiva/sclera: Conjunctivae normal.  Neck:     Thyroid: No thyromegaly.  Cardiovascular:     Rate and Rhythm: Normal rate and regular rhythm.     Heart sounds: Normal heart sounds. No murmur heard.   Pulmonary:     Effort: Pulmonary effort is normal. No respiratory distress.     Breath sounds: Normal breath sounds. No wheezing.  Musculoskeletal:        General: Normal range of motion.     Cervical back: Neck supple.  Lymphadenopathy:     Cervical: No cervical adenopathy.  Skin:    General: Skin is warm and dry.     Findings: No rash.  Neurological:     Mental Status: He is alert and oriented to person, place, and time.     Coordination: Coordination normal.  Psychiatric:        Behavior: Behavior normal.       Assessment & Plan:   Problem List Items Addressed This Visit      Cardiovascular and Mediastinum   Hypertension associated with diabetes ([place])   Relevant Medications   lisinopril (ZESTRIL) 10 MG tablet   metFORMIN (GLUCOPHAGE) 1000 MG tablet   Other Relevant Orders   CBC with Differential/Platelet   CMP14+EGFR     Digestive   GERD (gastroesophageal reflux disease)     Endocrine   Type 2 diabetes mellitus without complication, without long-term current use of insulin (HCC)   Relevant Medications   lisinopril (ZESTRIL) 10 MG tablet   metFORMIN (GLUCOPHAGE) 1000 MG tablet   Other Relevant Orders   CBC with Differential/Platelet   CMP14+EGFR   Hyperlipidemia associated with type 2 diabetes mellitus (HCC)   Relevant Medications   lisinopril (ZESTRIL) 10 MG tablet   metFORMIN (GLUCOPHAGE) 1000 MG tablet   Other Relevant Orders   Lipid panel    Other Visit Diagnoses     Controlled type 2 diabetes mellitus with complication, without long-term current use of insulin (HCC)    -  Primary   Relevant Medications   lisinopril (ZESTRIL) 10 MG tablet   metFORMIN (GLUCOPHAGE) 1000 MG tablet   Other Relevant Orders   Bayer DCA Hb A1c Waived   CBC with Differential/Platelet   Essential hypertension, benign       Relevant Medications   lisinopril (ZESTRIL) 10 MG tablet   Need for immunization against influenza       Relevant Orders   Flu Vaccine QUAD 36+ mos IM (Completed)  Benign prostatic hyperplasia without lower urinary tract symptoms       Relevant Medications   tamsulosin (FLOMAX) 0.4 MG CAPS capsule   Other Relevant Orders   PSA, total and free      Will start Metformin 1000 twice a day because of his A1c being up at 10.4.  Will monitor closely and see back in 3 months Follow up plan: Return in about 3 months (around 09/15/2020), or if symptoms worsen or fail to improve, for Diabetes and hyperlipidemia recheck.  Counseling provided for all of the vaccine components Orders Placed This Encounter  Procedures  . Flu Vaccine QUAD 36+ mos IM  . Bayer DCA Hb A1c Waived  . CBC with Differential/Platelet  . CMP14+EGFR  . Lipid panel  . PSA, total and free    Caryl Pina, MD [facility] 06/15/2020, 11:52 AM

## 2020-06-16 ENCOUNTER — Encounter: Payer: Self-pay | Admitting: Family Medicine

## 2020-06-16 ENCOUNTER — Other Ambulatory Visit: Payer: Self-pay

## 2020-06-16 ENCOUNTER — Telehealth: Payer: Self-pay | Admitting: Family Medicine

## 2020-06-16 LAB — LIPID PANEL
Chol/HDL Ratio: 3.8 ratio (ref 0.0–5.0)
Cholesterol, Total: 219 mg/dL — ABNORMAL HIGH (ref 100–199)
HDL: 57 mg/dL (ref >39)
LDL Chol Calc (NIH): 127 mg/dL — ABNORMAL HIGH (ref 0–99)
Triglycerides: 197 mg/dL — ABNORMAL HIGH (ref 0–149)
VLDL Cholesterol Cal: 35 mg/dL (ref 5–40)

## 2020-06-16 LAB — CBC WITH DIFFERENTIAL/PLATELET
Basophils Absolute: 0 10*3/uL (ref 0.0–0.2)
Basos: 0 %
EOS (ABSOLUTE): 0.2 10*3/uL (ref 0.0–0.4)
Eos: 2 %
Hematocrit: 50.1 % (ref 37.5–51.0)
Hemoglobin: 16.6 g/dL (ref 13.0–17.7)
Immature Grans (Abs): 0 10*3/uL (ref 0.0–0.1)
Immature Granulocytes: 0 %
Lymphocytes Absolute: 3.4 10*3/uL — ABNORMAL HIGH (ref 0.7–3.1)
Lymphs: 33 %
MCH: 30.3 pg (ref 26.6–33.0)
MCHC: 33.1 g/dL (ref 31.5–35.7)
MCV: 91 fL (ref 79–97)
Monocytes Absolute: 1.3 10*3/uL — ABNORMAL HIGH (ref 0.1–0.9)
Monocytes: 13 %
Neutrophils Absolute: 5.4 10*3/uL (ref 1.4–7.0)
Neutrophils: 52 %
Platelets: 321 10*3/uL (ref 150–450)
RBC: 5.48 x10E6/uL (ref 4.14–5.80)
RDW: 12 % (ref 11.6–15.4)
WBC: 10.4 10*3/uL (ref 3.4–10.8)

## 2020-06-16 LAB — CMP14+EGFR
ALT: 27 IU/L (ref 0–44)
AST: 15 IU/L (ref 0–40)
Albumin/Globulin Ratio: 1.5 (ref 1.2–2.2)
Albumin: 4.3 g/dL (ref 3.8–4.8)
Alkaline Phosphatase: 147 IU/L — ABNORMAL HIGH (ref 44–121)
BUN/Creatinine Ratio: 12 (ref 10–24)
BUN: 11 mg/dL (ref 8–27)
Bilirubin Total: 0.3 mg/dL (ref 0.0–1.2)
CO2: 25 mmol/L (ref 20–29)
Calcium: 9.9 mg/dL (ref 8.6–10.2)
Chloride: 98 mmol/L (ref 96–106)
Creatinine, Ser: 0.91 mg/dL (ref 0.76–1.27)
GFR calc Af Amer: 105 mL/min/{1.73_m2} (ref >59)
GFR calc non Af Amer: 91 mL/min/{1.73_m2} (ref >59)
Globulin, Total: 2.9 g/dL (ref 1.5–4.5)
Glucose: 488 mg/dL (ref 65–99)
Potassium: 5 mmol/L (ref 3.5–5.2)
Sodium: 135 mmol/L (ref 134–144)
Total Protein: 7.2 g/dL (ref 6.0–8.5)

## 2020-06-16 LAB — PSA, TOTAL AND FREE
PSA, Free Pct: 16.7 %
PSA, Free: 0.1 ng/mL
Prostate Specific Ag, Serum: 0.6 ng/mL (ref 0.0–4.0)

## 2020-06-16 MED ORDER — ROSUVASTATIN CALCIUM 5 MG PO TABS: 5.0000 mg | ORAL_TABLET | Freq: Every day | ORAL | 1 refills | Status: DC

## 2020-06-16 NOTE — Telephone Encounter (Signed)
Pt needs a letter to return to work on 11/19, only out of work on 11/18, this letter can be added to [employer] and pt's wife said they would get it off of there

## 2020-06-16 NOTE — Telephone Encounter (Signed)
Patient was seen yesterday- please advise

## 2020-06-16 NOTE — Telephone Encounter (Signed)
Letter sent - wife aware per dpr.

## 2020-06-16 NOTE — Telephone Encounter (Signed)
Yes go ahead and do the letter for the patient.

## 2020-06-21 ENCOUNTER — Telehealth (HOSPITAL_COMMUNITY): Admitting: Psychiatry

## 2020-06-21 ENCOUNTER — Other Ambulatory Visit: Payer: Self-pay

## 2020-06-27 ENCOUNTER — Telehealth: Payer: Self-pay

## 2020-06-27 DIAGNOSIS — N400 Enlarged prostate without lower urinary tract symptoms: Secondary | ICD-10-CM

## 2020-06-27 MED ORDER — TAMSULOSIN HCL 0.4 MG PO CAPS: 0.4000 mg | ORAL_CAPSULE | Freq: Every day | ORAL | 0 refills | Status: DC

## 2020-06-27 NOTE — Telephone Encounter (Signed)
Aware refill sent to [facility] till they get his mail order

## 2020-06-27 NOTE — Telephone Encounter (Signed)
  Prescription Request  06/27/2020  What is the name of the medication or equipment? Tamsulosin  Have you contacted your pharmacy to request a refill? (if applicable) Yes  Which pharmacy would you like this sent to? [facility], [place]  [facility] but pt still has not received them yet and will run out of Rx before he receives it in the mail. Needs emergency refill sent to [facility] pharmacy in [place]   Patient notified that their request is being sent to the clinical staff for review and that they should receive a response within 2 business days.

## 2020-06-28 ENCOUNTER — Telehealth: Payer: Self-pay

## 2020-06-28 NOTE — Telephone Encounter (Signed)
This will have to wait for PCP

## 2020-06-28 NOTE — Telephone Encounter (Signed)
Pts wife called stating that pt needs a note for work because he wasn't able to work this past Saturday, Sunday, or Monday, due to not having his Tamsulosin Rx and not being able to hardly walk.   Please advise and call patient or wife.

## 2020-06-29 ENCOUNTER — Encounter: Payer: Self-pay | Admitting: Family Medicine

## 2020-06-29 NOTE — Telephone Encounter (Signed)
Note sent to mychart- patient aware.

## 2020-06-29 NOTE — Telephone Encounter (Signed)
We can go ahead and do a note for those days, in the future if something is happening that he needs a note for commonly we do have to have a visit associated with it.

## 2020-07-12 ENCOUNTER — Telehealth (HOSPITAL_COMMUNITY): Admitting: Psychiatry

## 2020-07-12 ENCOUNTER — Other Ambulatory Visit: Payer: Self-pay

## 2020-08-03 ENCOUNTER — Telehealth: Payer: Self-pay

## 2020-08-03 NOTE — Telephone Encounter (Signed)
Letter wrote and placed up front. Left patient detailed message.

## 2020-08-03 NOTE — Telephone Encounter (Signed)
Okay to go ahead and write the note for the patient.  If he needs new FMLA then he will probably need to make an appointment again.

## 2020-08-03 NOTE — Telephone Encounter (Signed)
Patient states that he was in the hospital with his wife and she was very sick.  Requesting letter to excuse him from work.  Advised patient to go through his work and he states he has FMLA for his wife but he missed more than the 3 days he was allowed.  Advised patient that he normally needs to be seen in order to get a work note.

## 2020-08-03 NOTE — Telephone Encounter (Signed)
Spouse was in the hospital and is requesting a letter for husband--husband needs a note to return to work he was off Friday Saturday and Sunday Monday he'll be returning to work on 08/04/2020 Thursday but he needs a note if you can just put it in here I can print it off for him and then we can go from there on Friday.

## 2020-08-08 ENCOUNTER — Encounter: Payer: Self-pay | Admitting: Family Medicine

## 2020-08-08 ENCOUNTER — Other Ambulatory Visit: Payer: Self-pay | Admitting: *Deleted

## 2020-08-08 DIAGNOSIS — N400 Enlarged prostate without lower urinary tract symptoms: Secondary | ICD-10-CM

## 2020-08-08 MED ORDER — TAMSULOSIN HCL 0.4 MG PO CAPS: 0.4000 mg | ORAL_CAPSULE | Freq: Every day | ORAL | 0 refills | Status: DC

## 2020-08-09 ENCOUNTER — Other Ambulatory Visit (HOSPITAL_COMMUNITY): Payer: Self-pay | Admitting: Psychiatry

## 2020-08-09 MED ORDER — LITHIUM CARBONATE ER 450 MG PO TBCR
900.0000 mg | EXTENDED_RELEASE_TABLET | Freq: Every day | ORAL | 3 refills | Status: DC
Start: 2020-08-09 — End: 2020-11-23

## 2020-08-16 ENCOUNTER — Telehealth (INDEPENDENT_AMBULATORY_CARE_PROVIDER_SITE_OTHER): Admitting: Psychiatry

## 2020-08-16 ENCOUNTER — Encounter (HOSPITAL_COMMUNITY): Payer: Self-pay | Admitting: Psychiatry

## 2020-08-16 ENCOUNTER — Other Ambulatory Visit: Payer: Self-pay

## 2020-08-16 DIAGNOSIS — F3162 Bipolar disorder, current episode mixed, moderate: Secondary | ICD-10-CM | POA: Diagnosis not present

## 2020-08-16 MED ORDER — DULOXETINE HCL 60 MG PO CPEP: 60.0000 mg | ORAL_CAPSULE | Freq: Every day | ORAL | 2 refills | Status: DC

## 2020-08-16 MED ORDER — HYDROXYZINE HCL 25 MG PO TABS: 25.0000 mg | ORAL_TABLET | Freq: Three times a day (TID) | ORAL | 2 refills | Status: DC | PRN

## 2020-08-16 NOTE — Progress Notes (Signed)
Virtual Visit via Telephone Note  I connected with Adam Oneal on 08/16/20 at 11:40 AM EST by telephone and verified that I am speaking with the correct person using two identifiers.  Location: Patient: home Provider: home   I discussed the limitations, risks, security and privacy concerns of performing an evaluation and management service by telephone and the availability of in person appointments. I also discussed with the patient that there may be a patient responsible charge related to this service. The patient expressed understanding and agreed to proceed.    I discussed the assessment and treatment plan with the patient. The patient was provided an opportunity to ask questions and all were answered. The patient agreed with the plan and demonstrated an understanding of the instructions.   The patient was advised to call back or seek an in-person evaluation if the symptoms worsen or if the condition fails to improve as anticipated.  I provided 15 minutes of non-face-to-face time during this encounter.   Diannia Ruder, MD  [facility] MD/PA/NP OP Progress Note  08/16/2020 11:29 AM Adam Oneal  MRN:  [id number]  Chief Complaint:  Chief Complaint    Depression; Hallucinations; Anxiety; Follow-up     HPI: This patient is a 60 year old married white male who lives with his wife in [place].  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  The couple have no children.  The patient returns for follow-up after about 5 months.  He states that he is retiring at the end of this month and is looking forward to it.  As usual he sounds a bit disorganized and claims that he is missed some doses of his medication because the pharmacy is not sending it but when I spoke to his wife she stated that he does have enough of his lithium.  For some reason he has gotten off the duloxetine although he claims he is not depressed but is having a lot more chronic pain.  He denies  significant anxiety and the hydroxyzine helps when he does feel anxious.  He denies auditory visual hallucinations suicidal ideation or trouble sleeping. Visit Diagnosis:    ICD-10-CM   1. Bipolar 1 disorder, mixed, moderate (HCC)  F31.62     Past Psychiatric History: Long-term outpatient treatment for bipolar disorder  Past Medical History:  Past Medical History:  Diagnosis Date  . Acid reflux   . Anxiety 01/1998  . Cataract 02/2004  . Depression 01/1998  . Diabetes mellitus without complication (HCC) 12/1999  . Diabetes mellitus, type II (HCC)   . Hyperlipidemia 11/2006  . Hypertension   . Hypertension    History reviewed. No pertinent surgical history.  Family Psychiatric History: See below  Family History:  Family History  Problem Relation Age of Onset  . Cancer Mother 66       lung  . Heart disease Father   . Hyperlipidemia Father   . Diabetes Brother   . Leukemia Brother   . Diabetes Paternal Grandmother   . Heart disease Paternal Grandmother   . Alcohol abuse Paternal Grandfather   . Diabetes Brother   . Depression Maternal Aunt     Social History:  Social History   Socioeconomic History  . Marital status: Married    Spouse name: Not on file  . Number of children: Not on file  . Years of education: Not on file  . Highest education level: Not on file  Occupational History  . Not on file  Tobacco Use  . Smoking status: Former Smoker    Packs/day: 1.00    Years: 12.00    Pack years: 12.00    Types: Cigarettes    Start date: 01/05/1978    Quit date: 12/28/1989    Years since quitting: 30.6  . Smokeless tobacco: Former Clinical biochemist  . Vaping Use: Never used  Substance and Sexual Activity  . Alcohol use: No    Alcohol/week: 0.0 standard drinks    Comment: 03-26-16 per pt no  . Drug use: No    Comment: 03-26-16 per pt currently no but use to  . Sexual activity: Never  Other Topics Concern  . Not on file  Social History Narrative  . Not on file    Social Determinants of Health   Financial Resource Strain: Not on file  Food Insecurity: Not on file  Transportation Needs: Not on file  Physical Activity: Not on file  Stress: Not on file  Social Connections: Not on file    Allergies:  Allergies  Allergen Reactions  . Lipitor [Atorvastatin] Shortness Of Breath    Metabolic Disorder Labs: Lab Results  Component Value Date   HGBA1C 10.3 (H) 06/15/2020   No results found for: PROLACTIN Lab Results  Component Value Date   CHOL 219 (H) 06/15/2020   TRIG 197 (H) 06/15/2020   HDL 57 06/15/2020   CHOLHDL 3.8 06/15/2020   LDLCALC 127 (H) 06/15/2020   LDLCALC 73 04/10/2018   Lab Results  Component Value Date   TSH 0.96 02/09/2019   TSH 0.893 04/10/2018    Therapeutic Level Labs: Lab Results  Component Value Date   LITHIUM 0.3 (L) 02/09/2019   LITHIUM 0.4 (L) 04/10/2018   No results found for: VALPROATE No components found for:  CBMZ  Current Medications: Current Outpatient Medications  Medication Sig Dispense Refill  . cetirizine (ZYRTEC) 10 MG tablet Take 1 tablet (10 mg total) by mouth daily. 90 tablet 3  . cyclobenzaprine (FLEXERIL) 10 MG tablet Take 1 tablet (10 mg total) by mouth 3 (three) times daily as needed for muscle spasms. 270 tablet 3  . DULoxetine (CYMBALTA) 60 MG capsule Take 1 capsule (60 mg total) by mouth daily. 90 capsule 2  . hydrOXYzine (ATARAX/VISTARIL) 25 MG tablet Take 1 tablet (25 mg total) by mouth every 8 (eight) hours as needed for anxiety. 30 tablet 2  . lisinopril (ZESTRIL) 10 MG tablet Take 1 tablet (10 mg total) by mouth daily. 90 tablet 3  . lithium carbonate (ESKALITH) 450 MG CR tablet Take 2 tablets (900 mg total) by mouth at bedtime. 180 tablet 3  . metFORMIN (GLUCOPHAGE) 1000 MG tablet Take 1 tablet (1,000 mg total) by mouth 2 (two) times daily with a meal. 180 tablet 3  . rosuvastatin (CRESTOR) 5 MG tablet Take 1 tablet (5 mg total) by mouth daily. 90 tablet 1  . tamsulosin  (FLOMAX) 0.4 MG CAPS capsule Take 1 capsule (0.4 mg total) by mouth daily. 90 capsule 0   No current facility-administered medications for this visit.     Musculoskeletal: Strength & Muscle Tone: within normal limits Gait & Station: normal Patient leans: N/A  Psychiatric Specialty Exam: Review of Systems  Musculoskeletal: Positive for arthralgias and back pain.  All other systems reviewed and are negative.   There were no vitals taken for this visit.There is no height or weight on file to calculate BMI.  General Appearance: NA  Eye Contact:  NA  Speech:  Clear and  Coherent  Volume:  Normal  Mood:  Euthymic  Affect:  NA  Thought Process:  Descriptions of Associations: Tangential  Orientation:  Full (Time, Place, and Person)  Thought Content: Rumination   Suicidal Thoughts:  No  Homicidal Thoughts:  No  Memory:  Immediate;   Good Recent;   Fair Remote;   Fair  Judgement:  Fair  Insight:  Shallow  Psychomotor Activity:  Normal  Concentration:  Concentration: Fair and Attention Span: Fair  Recall:  Good  Fund of Knowledge: Good  Language: Good  Akathisia:  No  Handed:  Right  AIMS (if indicated): not done  Assets:  Communication Skills Desire for Improvement Resilience Social Support Talents/Skills  ADL's:  Intact  Cognition: WNL  Sleep:  Good   Screenings: PHQ2-9   Flowsheet Row Office Visit from 06/15/2020 in [facility] Office Visit from 01/20/2020 in [facility] Office Visit from 10/09/2019 in [facility] Office Visit from 08/17/2019 in [facility] Office Visit from 05/07/2019 in [facility]  PHQ-2 Total Score 0 0 0 0 0       Assessment and Plan: This patient is a 60 year old male with a history of bipolar disorder.  He seems to be at baseline.  We do need to check his lithium level and TSH his recent renal functions were all normal.  He will  continue lithium carbonate 900 mg daily, Cymbalta 60 mg daily for mood stabilization and depression.  He will continue hydroxyzine 25 mg daily as needed for anxiety.  He will return to see me in 3 months   Diannia Ruder, MD 08/16/2020, 11:29 AM

## 2020-08-17 ENCOUNTER — Encounter: Payer: Self-pay | Admitting: Nurse Practitioner

## 2020-08-17 ENCOUNTER — Other Ambulatory Visit: Payer: Self-pay

## 2020-08-17 ENCOUNTER — Ambulatory Visit (INDEPENDENT_AMBULATORY_CARE_PROVIDER_SITE_OTHER): Admitting: Nurse Practitioner

## 2020-08-17 VITALS — BP 127/86 | HR 67 | Temp 97.4°F | Ht 72.0 in | Wt 241.6 lb

## 2020-08-17 DIAGNOSIS — M25559 Pain in unspecified hip: Secondary | ICD-10-CM

## 2020-08-17 DIAGNOSIS — M25561 Pain in right knee: Secondary | ICD-10-CM | POA: Diagnosis not present

## 2020-08-17 MED ORDER — DULOXETINE HCL 60 MG PO CPEP: 60.0000 mg | ORAL_CAPSULE | Freq: Every day | ORAL | 0 refills | Status: DC

## 2020-08-17 MED ORDER — METHYLPREDNISOLONE ACETATE 40 MG/ML IJ SUSP
40.0000 mg | Freq: Once | INTRAMUSCULAR | Status: AC
  Administered 2020-08-17: 40 mg via INTRAMUSCULAR

## 2020-08-17 NOTE — Assessment & Plan Note (Signed)
Hip pain not well controlled this is new for patient in the last few days.  Patient is not currently taking Cymbalta or any of his pain medications due to not picking it up from the pharmacy.  Advised patient to start taking medication for pain as prescribed.  Depo-Medrol shot given in office.  Education provided with printed handouts given.  Patient verbalized understanding.   Follow-up for worsening unresolved symptoms.

## 2020-08-17 NOTE — Patient Instructions (Addendum)
Acute Knee Pain, Adult Many things can cause knee pain. Sometimes, knee pain is sudden (acute) and may be caused by damage, swelling, or irritation of the muscles and tissues that support your knee. The pain often goes away on its own with time and rest. If the pain does not go away, tests may be done to find out what is causing the pain. Follow these instructions at home: If you have a knee sleeve or brace:  Wear the knee sleeve or brace as told by your doctor. Take it off only as told by your doctor.  Loosen it if your toes: ? Tingle. ? Become numb. ? Turn cold and blue.  Keep it clean.  If the knee sleeve or brace is not waterproof: ? Do not let it get wet. ? Cover it with a watertight covering when you take a bath or shower.   Activity  Rest your knee.  Do not do things that cause pain or make pain worse.  Avoid activities where both feet leave the ground at the same time (high-impact activities). Examples are running, jumping rope, and doing jumping jacks.  Work with a physical therapist to make a safe exercise program, as told by your doctor. Managing pain, stiffness, and swelling  If told, put ice on the knee. To do this: ? If you have a removable knee sleeve or brace, take it off as told by your doctor. ? Put ice in a plastic bag. ? Place a towel between your skin and the bag. ? Leave the ice on for 20 minutes, 2-3 times a day. ? Take off the ice if your skin turns bright red. This is very important. If you cannot feel pain, heat, or cold, you have a greater risk of damage to the area.  If told, use an elastic bandage to put pressure (compression) on your injured knee.  Raise your knee above the level of your heart while you are sitting or lying down.  Sleep with a pillow under your knee.   General instructions  Take over-the-counter and prescription medicines only as told by your doctor.  Do not smoke or use any products that contain nicotine or tobacco. If you  need help quitting, ask your doctor.  If you are overweight, work with your doctor and a food expert (dietitian) to set goals to lose weight. Being overweight can make your knee hurt more.  Watch for any changes in your symptoms.  Keep all follow-up visits. Contact a doctor if:  The knee pain does not stop.  The knee pain changes or gets worse.  You have a fever along with knee pain.  Your knee is red or feels warm when you touch it.  Your knee gives out or locks up. Get help right away if:  Your knee swells, and the swelling gets worse.  You cannot move your knee.  You have very bad knee pain that does not get better with pain medicine. Summary  Many things can cause knee pain. The pain often goes away on its own with time and rest.  Your doctor may do tests to find out the cause of the pain.  Watch for any changes in your symptoms. Relieve your pain with rest, medicines, light activity, and use of ice.  Get help right away if you cannot move your knee or your knee pain is very bad. This information is not intended to replace advice given to you by your health care provider. Make sure you discuss   any questions you have with your health care provider. Document Revised: 12/30/2019 Document Reviewed: 12/30/2019 Elsevier Patient Education  2021 [employer] Inc[employer]  Hip Pain The hip is the joint between the upper legs and the lower pelvis. The bones, cartilage, tendons, and muscles of your hip joint support your body and allow you to move around. Hip pain can range from a minor ache to severe pain in one or both of your hips. The pain may be felt on the inside of the hip joint near the groin, or on the outside near the buttocks and upper thigh. You may also have swelling or stiffness in your hip area. Follow these instructions at home: Managing pain, stiffness, and swelling  If directed, put ice on the painful area. To do this: ? Put ice in a plastic bag. ? Place a towel between  your skin and the bag. ? Leave the ice on for 20 minutes, 2-3 times a day.  If directed, apply heat to the affected area as often as told by your health care provider. Use the heat source that your health care provider recommends, such as a moist heat pack or a heating pad. ? Place a towel between your skin and the heat source. ? Leave the heat on for 20-30 minutes. ? Remove the heat if your skin turns bright red. This is especially important if you are unable to feel pain, heat, or cold. You may have a greater risk of getting burned.      Activity  Do exercises as told by your health care provider.  Avoid activities that cause pain. General instructions  Take over-the-counter and prescription medicines only as told by your health care provider.  Keep a journal of your symptoms. Write down: ? How often you have hip pain. ? The location of your pain. ? What the pain feels like. ? What makes the pain worse.  Sleep with a pillow between your legs on your most comfortable side.  Keep all follow-up visits as told by your health care provider. This is important.   Contact a health care provider if:  You cannot put weight on your leg.  Your pain or swelling continues or gets worse after one week.  It gets harder to walk.  You have a fever. Get help right away if:  You fall.  You have a sudden increase in pain and swelling in your hip.  Your hip is red or swollen or very tender to touch. Summary  Hip pain can range from a minor ache to severe pain in one or both of your hips.  The pain may be felt on the inside of the hip joint near the groin, or on the outside near the buttocks and upper thigh.  Avoid activities that cause pain.  Write down how often you have hip pain, the location of the pain, what makes it worse, and what it feels like. This information is not intended to replace advice given to you by your health care provider. Make sure you discuss any questions you  have with your health care provider. Document Revised: 12/01/2018 Document Reviewed: 12/01/2018 Elsevier Patient Education  2021 [employer].

## 2020-08-17 NOTE — Assessment & Plan Note (Signed)
Acute right knee pain not well controlled.  Patient not taking medication as prescribed.  Advised patient to start taking Cymbalta as prescribed.  Follow-up with worsening or unresolved symptoms Rx sent for 10 days Cymbalta as patient did not collect the mail prescription ordered.  Follow-up with worsening or unresolved symptoms.

## 2020-08-17 NOTE — Progress Notes (Signed)
Acute Office Visit  Subjective:    Patient ID: Adam Oneal, male    DOB: [birth year], 60 y.o.   MRN: [id number]  Chief Complaint  Patient presents with  . Knee Pain  . Hip Pain    HPI Patient is a 60 year old male who presents to clinic with acute knee pain/hip pain. Pain  He reports new onset right knee, and hip pain. was not an injury that may have caused the pain. The pain started about a week ago and is worsening. The pain does not radiate . The pain is described as aching, is 7/10 in intensity, occurring constantly. Symptoms are worse in the: morning, mid-day, afternoon, evening  Aggravating factors: bending backwards, bending forwards and bending sideways Relieving factors: none.  He has tried application of heat and NSAIDs with little relief.   ---------------------------------------------------------------------------------------------------   Past Medical History:  Diagnosis Date  . Acid reflux   . Anxiety 01/1998  . Cataract 02/2004  . Depression 01/1998  . Diabetes mellitus without complication (HCC) 12/1999  . Diabetes mellitus, type II (HCC)   . Hyperlipidemia 11/2006  . Hypertension   . Hypertension     History reviewed. No pertinent surgical history.  Family History  Problem Relation Age of Onset  . Cancer Mother 4       lung  . Heart disease Father   . Hyperlipidemia Father   . Diabetes Brother   . Leukemia Brother   . Diabetes Paternal Grandmother   . Heart disease Paternal Grandmother   . Alcohol abuse Paternal Grandfather   . Diabetes Brother   . Depression Maternal Aunt     Social History   Socioeconomic History  . Marital status: Married    Spouse name: Not on file  . Number of children: Not on file  . Years of education: Not on file  . Highest education level: Not on file  Occupational History  . Not on file  Tobacco Use  . Smoking status: Former Smoker    Packs/day: 1.00    Years: 12.00    Pack years: 12.00    Types:  Cigarettes    Start date: 01/05/1978    Quit date: 12/28/1989    Years since quitting: 30.6  . Smokeless tobacco: Former Clinical biochemist  . Vaping Use: Never used  Substance and Sexual Activity  . Alcohol use: No    Alcohol/week: 0.0 standard drinks    Comment: 03-26-16 per pt no  . Drug use: No    Comment: 03-26-16 per pt currently no but use to  . Sexual activity: Never  Other Topics Concern  . Not on file  Social History Narrative  . Not on file   Social Determinants of Health   Financial Resource Strain: Not on file  Food Insecurity: Not on file  Transportation Needs: Not on file  Physical Activity: Not on file  Stress: Not on file  Social Connections: Not on file  Intimate Partner Violence: Not on file    Outpatient Medications Prior to Visit  Medication Sig Dispense Refill  . cetirizine (ZYRTEC) 10 MG tablet Take 1 tablet (10 mg total) by mouth daily. 90 tablet 3  . cyclobenzaprine (FLEXERIL) 10 MG tablet Take 1 tablet (10 mg total) by mouth 3 (three) times daily as needed for muscle spasms. 270 tablet 3  . DULoxetine (CYMBALTA) 60 MG capsule Take 1 capsule (60 mg total) by mouth daily. 90 capsule 2  . hydrOXYzine (ATARAX/VISTARIL) 25 MG tablet Take  1 tablet (25 mg total) by mouth every 8 (eight) hours as needed for anxiety. 30 tablet 2  . lisinopril (ZESTRIL) 10 MG tablet Take 1 tablet (10 mg total) by mouth daily. 90 tablet 3  . lithium carbonate (ESKALITH) 450 MG CR tablet Take 2 tablets (900 mg total) by mouth at bedtime. 180 tablet 3  . metFORMIN (GLUCOPHAGE) 1000 MG tablet Take 1 tablet (1,000 mg total) by mouth 2 (two) times daily with a meal. 180 tablet 3  . rosuvastatin (CRESTOR) 5 MG tablet Take 1 tablet (5 mg total) by mouth daily. 90 tablet 1  . tamsulosin (FLOMAX) 0.4 MG CAPS capsule Take 1 capsule (0.4 mg total) by mouth daily. 90 capsule 0   No facility-administered medications prior to visit.    Allergies  Allergen Reactions  . Lipitor [Atorvastatin]  Shortness Of Breath    Review of Systems  Constitutional: Negative.   HENT: Negative.   Eyes: Negative.   Respiratory: Negative.   Cardiovascular: Negative.   Gastrointestinal: Negative.   Genitourinary: Negative.   Musculoskeletal: Positive for joint swelling.  Skin: Negative.   All other systems reviewed and are negative.      Objective:    Physical Exam Vitals reviewed.  Constitutional:      Appearance: Normal appearance.  HENT:     Head: Normocephalic.     Nose: Nose normal.  Eyes:     Conjunctiva/sclera: Conjunctivae normal.  Cardiovascular:     Rate and Rhythm: Normal rate and regular rhythm.     Pulses: Normal pulses.     Heart sounds: Normal heart sounds.  Pulmonary:     Effort: Pulmonary effort is normal.     Breath sounds: Normal breath sounds.  Abdominal:     General: Bowel sounds are normal.  Musculoskeletal:        General: Tenderness present. No swelling or signs of injury.     Right lower leg: No edema.     Left lower leg: No edema.  Skin:    General: Skin is warm.  Neurological:     Mental Status: He is alert and oriented to person, place, and time.     BP 127/86   Pulse 67   Temp (!) 97.4 F (36.3 C)   Ht 6' (1.829 m)   Wt 241 lb 9.6 oz (109.6 kg)   SpO2 98%   BMI 32.77 kg/m  Wt Readings from Last 3 Encounters:  08/17/20 241 lb 9.6 oz (109.6 kg)  06/15/20 239 lb (108.4 kg)  01/20/20 251 lb 3.2 oz (113.9 kg)    Health Maintenance Due  Topic Date Due  . PNEUMOCOCCAL POLYSACCHARIDE VACCINE AGE 22-64 HIGH RISK  Never done    There are no preventive care reminders to display for this patient.   Lab Results  Component Value Date   TSH 0.96 02/09/2019   Lab Results  Component Value Date   WBC 10.4 06/15/2020   HGB 16.6 06/15/2020   HCT 50.1 06/15/2020   MCV 91 06/15/2020   PLT 321 06/15/2020   Lab Results  Component Value Date   NA 135 06/15/2020   K 5.0 06/15/2020   CO2 25 06/15/2020   GLUCOSE 488 (HH) 06/15/2020   BUN  11 06/15/2020   CREATININE 0.91 06/15/2020   BILITOT 0.3 06/15/2020   ALKPHOS 147 (H) 06/15/2020   AST 15 06/15/2020   ALT 27 06/15/2020   PROT 7.2 06/15/2020   ALBUMIN 4.3 06/15/2020   CALCIUM 9.9 06/15/2020  Lab Results  Component Value Date   CHOL 219 (H) 06/15/2020   Lab Results  Component Value Date   HDL 57 06/15/2020   Lab Results  Component Value Date   LDLCALC 127 (H) 06/15/2020   Lab Results  Component Value Date   TRIG 197 (H) 06/15/2020   Lab Results  Component Value Date   CHOLHDL 3.8 06/15/2020   Lab Results  Component Value Date   HGBA1C 10.3 (H) 06/15/2020       Assessment & Plan:  Hip pain Hip pain not well controlled this is new for patient in the last few days.  Patient is not currently taking Cymbalta or any of his pain medications due to not picking it up from the pharmacy.  Advised patient to start taking medication for pain as prescribed.  Depo-Medrol shot given in office.  Education provided with printed handouts given.  Patient verbalized understanding.   Follow-up for worsening unresolved symptoms.  Acute pain of right knee Acute right knee pain not well controlled.  Patient not taking medication as prescribed.  Advised patient to start taking Cymbalta as prescribed.  Follow-up with worsening or unresolved symptoms Rx sent for 10 days Cymbalta as patient did not collect the mail prescription ordered.  Follow-up with worsening or unresolved symptoms.  Problem List Items Addressed This Visit      Other   Acute pain of right knee    Acute right knee pain not well controlled.  Patient not taking medication as prescribed.  Advised patient to start taking Cymbalta as prescribed.  Follow-up with worsening or unresolved symptoms Rx sent for 10 days Cymbalta as patient did not collect the mail prescription ordered.  Follow-up with worsening or unresolved symptoms.      Hip pain - Primary    Hip pain not well controlled this is new  for patient in the last few days.  Patient is not currently taking Cymbalta or any of his pain medications due to not picking it up from the pharmacy.  Advised patient to start taking medication for pain as prescribed.  Depo-Medrol shot given in office.  Education provided with printed handouts given.  Patient verbalized understanding.   Follow-up for worsening unresolved symptoms.          Meds ordered this encounter  Medications  . DULoxetine (CYMBALTA) 60 MG capsule    Sig: Take 1 capsule (60 mg total) by mouth daily.    Dispense:  10 capsule    Refill:  0    Order Specific Question:   Supervising Provider    Answer:   Raliegh Ip [[id number]]  . methylPREDNISolone acetate (DEPO-MEDROL) injection 40 mg     Daryll Drown, NP

## 2020-08-24 ENCOUNTER — Other Ambulatory Visit

## 2020-08-24 ENCOUNTER — Other Ambulatory Visit (HOSPITAL_COMMUNITY): Payer: Self-pay | Admitting: Psychiatry

## 2020-08-24 DIAGNOSIS — Z20822 Contact with and (suspected) exposure to covid-19: Secondary | ICD-10-CM

## 2020-08-24 DIAGNOSIS — F3162 Bipolar disorder, current episode mixed, moderate: Secondary | ICD-10-CM

## 2020-08-25 LAB — SARS-COV-2, NAA 2 DAY TAT

## 2020-08-25 LAB — NOVEL CORONAVIRUS, NAA: SARS-CoV-2, NAA: NOT DETECTED

## 2020-08-29 ENCOUNTER — Telehealth (HOSPITAL_COMMUNITY): Payer: Self-pay | Admitting: Psychiatry

## 2020-08-29 ENCOUNTER — Other Ambulatory Visit: Payer: Self-pay | Admitting: Family

## 2020-08-29 ENCOUNTER — Telehealth: Payer: Self-pay

## 2020-08-29 ENCOUNTER — Encounter: Payer: Self-pay | Admitting: Family Medicine

## 2020-08-29 NOTE — Telephone Encounter (Signed)
Called to schedule f/u appt, left message to return call

## 2020-08-29 NOTE — Telephone Encounter (Signed)
Pts wife called stating that pt's job is not paying him because they need 1 letter stating that he has been out of work taking care of his wife from 07/28/20 to 08/29/20. Says that they have several notes, but his job is requesting 1 note with start and end date of when pt was out caring for his wife.  Please advise and call patient/wife.

## 2020-08-29 NOTE — Telephone Encounter (Signed)
Please advise 

## 2020-08-30 NOTE — Telephone Encounter (Signed)
Note placed at front for pickup, patient's wife is aware

## 2020-08-30 NOTE — Telephone Encounter (Signed)
I know she has had a lot of health issues so we will go ahead and do the note for him but in the future when these things are happening, he needs to at least let me know I had a time when he is going to be out and how long she is out for.  Keep me in the loop.

## 2020-09-13 IMAGING — DX DG KNEE 1-2V*L*
2 series · 2 of 2 positions shown · non-contrast
Comparison: None.

CLINICAL DATA: Acute left knee pain.

EXAM:
LEFT KNEE - 1-2 VIEW

[knee ap]
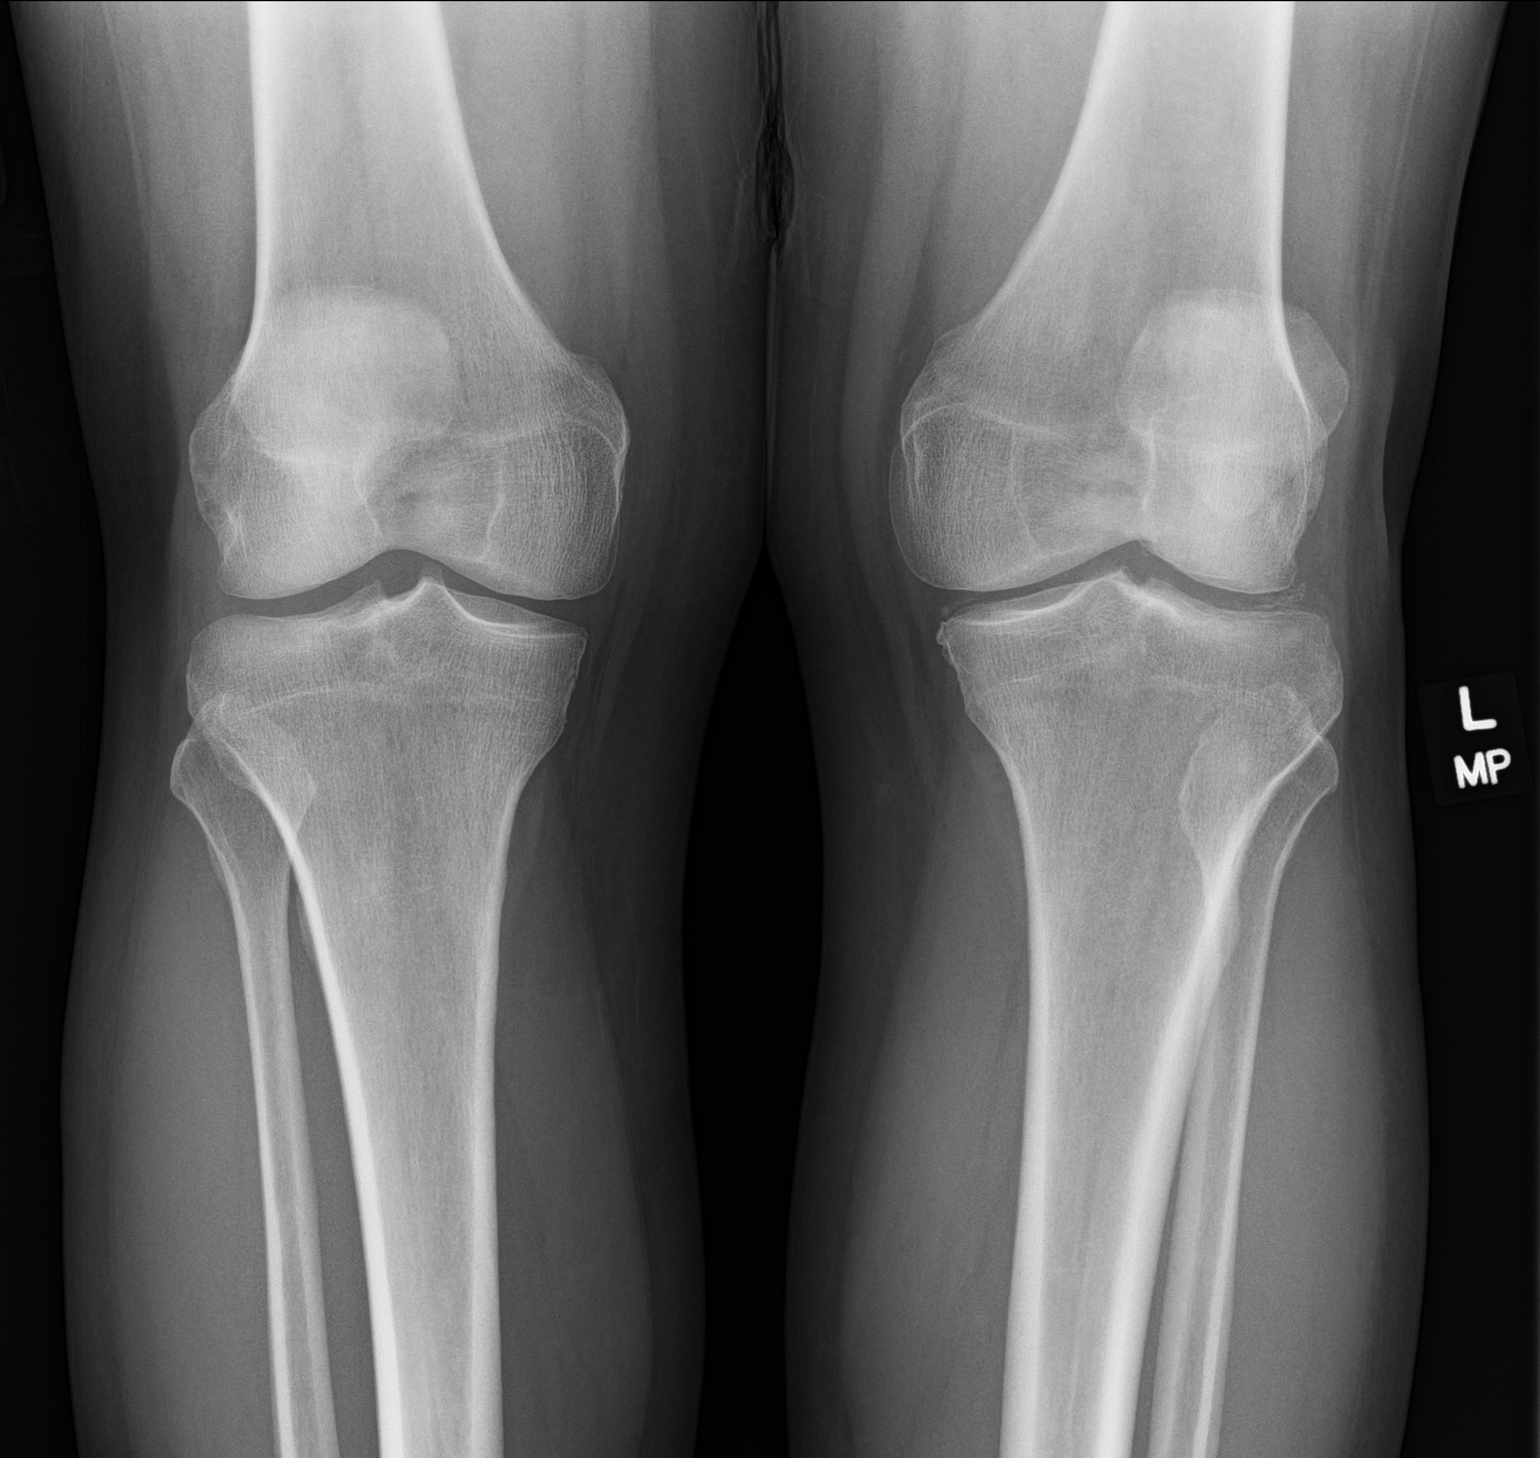

[knee lat]
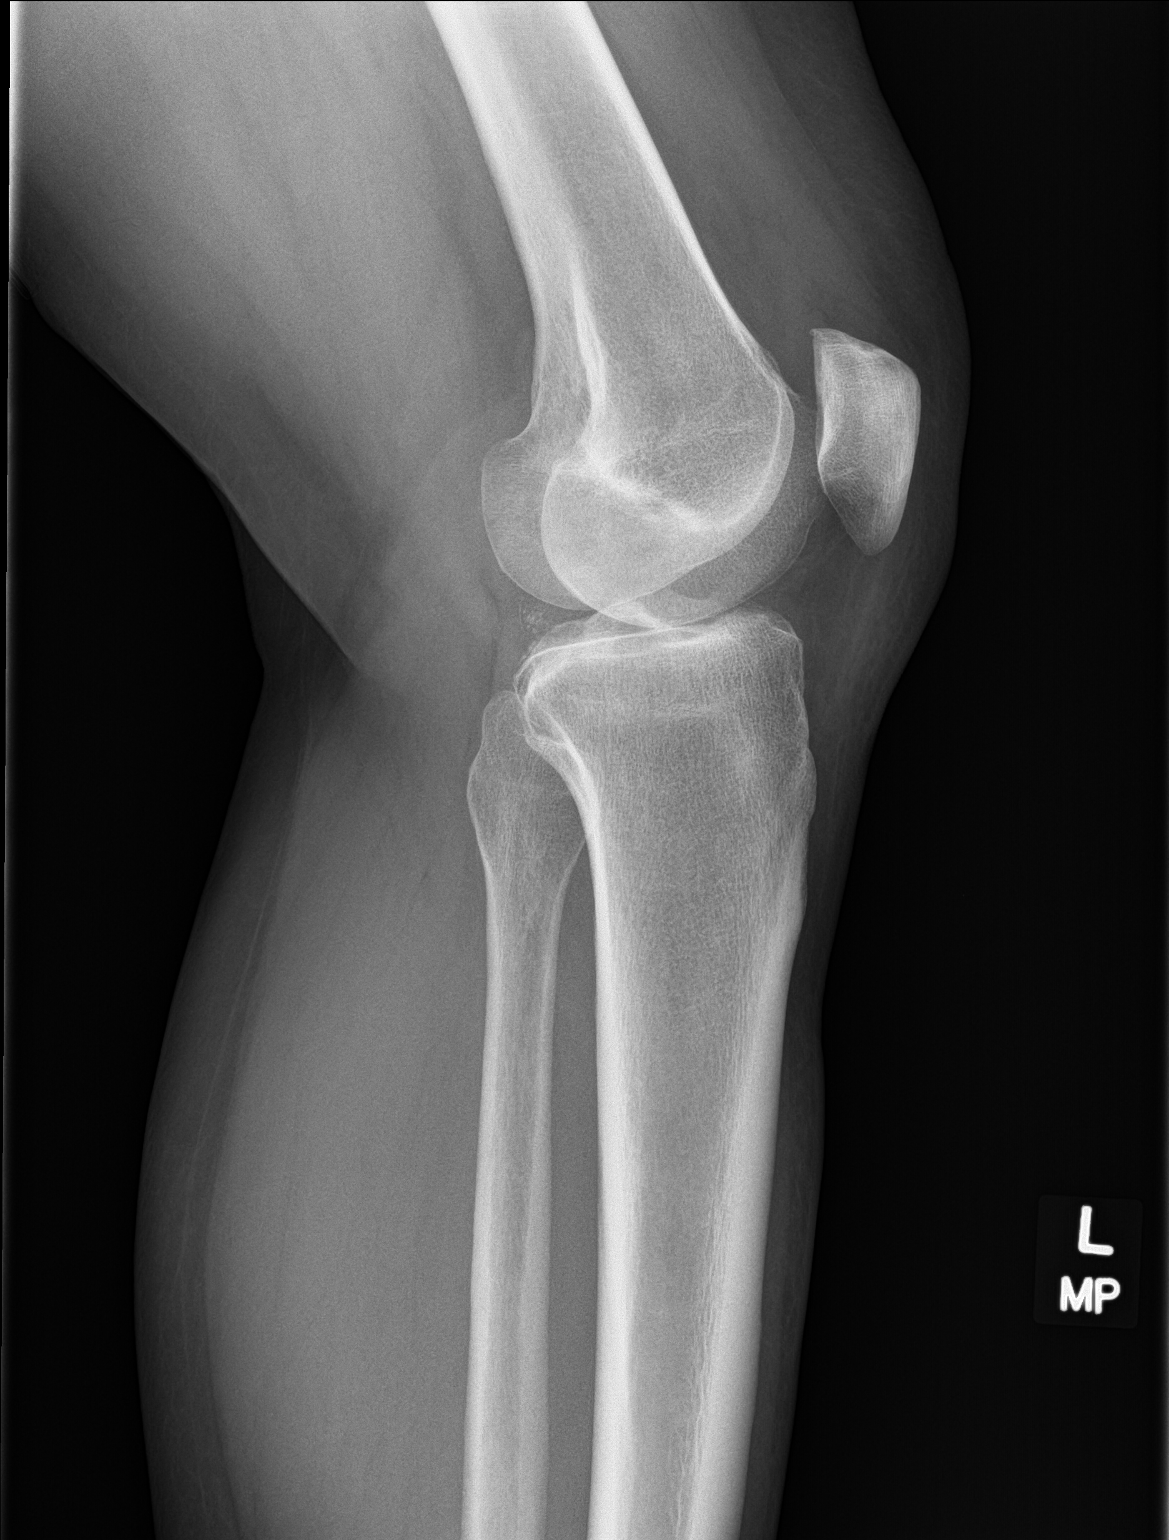

[2 of 2 positions shown; findings below may reference images not displayed]

FINDINGS: No evidence of fracture, dislocation, or joint effusion. No
significant joint space narrowing is noted. Chondrocalcinosis is
noted medially and laterally. Soft tissues are unremarkable.
IMPRESSION: Chondrocalcinosis is noted in the medial and lateral joint spaces.
This may represent calcium pyrophosphate deposition disease or
possibly early degenerative change. No acute abnormality seen in the
left knee.

## 2020-09-15 ENCOUNTER — Ambulatory Visit: Admitting: Family Medicine

## 2020-09-19 ENCOUNTER — Encounter: Payer: Self-pay | Admitting: Family Medicine

## 2020-10-27 ENCOUNTER — Telehealth: Payer: Self-pay

## 2020-10-27 DIAGNOSIS — N400 Enlarged prostate without lower urinary tract symptoms: Secondary | ICD-10-CM

## 2020-10-27 MED ORDER — TAMSULOSIN HCL 0.4 MG PO CAPS: 0.4000 mg | ORAL_CAPSULE | Freq: Every day | ORAL | 0 refills | Status: DC

## 2020-10-27 NOTE — Telephone Encounter (Signed)
refilled 

## 2020-10-27 NOTE — Telephone Encounter (Signed)
  Prescription Request  10/27/2020  What is the name of the medication or equipment? tamsulosin (FLOMAX) 0.4 MG CAPS capsule  Have you contacted your pharmacy to request a refill? (if applicable) [facility] called  Which pharmacy would you like this sent to? [facility]. Cancel mail order   Patient notified that their request is being sent to the clinical staff for review and that they should receive a response within 2 business days.

## 2020-11-04 ENCOUNTER — Telehealth: Payer: Self-pay

## 2020-11-04 NOTE — Telephone Encounter (Signed)
Pt called to schedule a check up/med refill appt with Dr Louanne Skye. Explained to pt that Dr Oris Drone first opening isnt until 12/09/20. Pt says he needs to be seen sooner than that because he is due for his diabetic check up and he's having issues with his sugar. Wants to know if Dr Louanne Skye can work him in to be seen sooner?  Please advise and call patient.

## 2020-11-04 NOTE — Telephone Encounter (Signed)
Dr. Louanne Skye had a cancellation on 4/11. Pt agreed to the appt.

## 2020-11-07 ENCOUNTER — Other Ambulatory Visit: Payer: Self-pay

## 2020-11-07 ENCOUNTER — Encounter: Payer: Self-pay | Admitting: Family Medicine

## 2020-11-07 ENCOUNTER — Ambulatory Visit (INDEPENDENT_AMBULATORY_CARE_PROVIDER_SITE_OTHER): Admitting: Family Medicine

## 2020-11-07 VITALS — BP 135/83 | HR 83 | Ht 72.0 in | Wt 232.0 lb

## 2020-11-07 DIAGNOSIS — I152 Hypertension secondary to endocrine disorders: Secondary | ICD-10-CM

## 2020-11-07 DIAGNOSIS — E1169 Type 2 diabetes mellitus with other specified complication: Secondary | ICD-10-CM | POA: Diagnosis not present

## 2020-11-07 DIAGNOSIS — N400 Enlarged prostate without lower urinary tract symptoms: Secondary | ICD-10-CM

## 2020-11-07 DIAGNOSIS — E1159 Type 2 diabetes mellitus with other circulatory complications: Secondary | ICD-10-CM | POA: Diagnosis not present

## 2020-11-07 DIAGNOSIS — F3162 Bipolar disorder, current episode mixed, moderate: Secondary | ICD-10-CM

## 2020-11-07 DIAGNOSIS — E119 Type 2 diabetes mellitus without complications: Secondary | ICD-10-CM | POA: Diagnosis not present

## 2020-11-07 DIAGNOSIS — E785 Hyperlipidemia, unspecified: Secondary | ICD-10-CM

## 2020-11-07 LAB — BAYER DCA HB A1C WAIVED: HB A1C (BAYER DCA - WAIVED): 11.7 % — ABNORMAL HIGH (ref <7.0)

## 2020-11-07 MED ORDER — ROSUVASTATIN CALCIUM 5 MG PO TABS: 5.0000 mg | ORAL_TABLET | Freq: Every day | ORAL | 3 refills | Status: DC

## 2020-11-07 MED ORDER — DULOXETINE HCL 60 MG PO CPEP: 60.0000 mg | ORAL_CAPSULE | Freq: Every day | ORAL | 0 refills | Status: DC

## 2020-11-07 MED ORDER — TAMSULOSIN HCL 0.4 MG PO CAPS: 0.4000 mg | ORAL_CAPSULE | Freq: Every day | ORAL | 3 refills | Status: DC

## 2020-11-07 NOTE — Progress Notes (Signed)
BP 135/83   Pulse 83   Ht 6' (1.829 m)   Wt 232 lb (105.2 kg)   SpO2 97%   BMI 31.46 kg/m    Subjective:   Patient ID: Adam Oneal, male    DOB: [birth year], 60 y.o.   MRN: [id number]  HPI: Adam Oneal is a 60 y.o. male presenting on 11/07/2020 for No chief complaint on file.   HPI Type 2 diabetes mellitus Patient comes in today for recheck of his diabetes. Patient has been currently taking Metformin. Patient is currently on an ACE inhibitor/ARB. Patient has not seen an ophthalmologist this year. Patient denies any issues with their feet. The symptom started onset as an adult hypertension and hyperlipidemia ARE RELATED TO DM   Hypertension Patient is currently on lisinopril, and their blood pressure today is 135/83. Patient denies any lightheadedness or dizziness. Patient denies headaches, blurred vision, chest pains, shortness of breath, or weakness. Denies any side effects from medication and is content with current medication.   Hyperlipidemia Patient is coming in for recheck of his hyperlipidemia. The patient is currently taking Crestor. They deny any issues with myalgias or history of liver damage from it. They deny any focal numbness or weakness or chest pain.   Bipolar Patient sees psychiatry for bipolar and is currently on lithium and Cymbalta.  He says that he is getting meds refilled today.  His wife passed away in 11/03/2022 and that has been weighing on him.  She was very sick before she passed away but it still has been weighing on him. Depression screen [facility] 2/9 11/07/2020 08/17/2020 06/15/2020 01/20/2020 10/09/2019  Decreased Interest 0 0 0 0 0  Down, Depressed, Hopeless 1 0 0 0 0  PHQ - 2 Score 1 0 0 0 0  Altered sleeping - - - - -  Tired, decreased energy - - - - -  Change in appetite - - - - -  Trouble concentrating - - - - -  Moving slowly or fidgety/restless - - - - -  Suicidal thoughts - - - - -  PHQ-9 Score - - - - -     Relevant past medical, surgical,  family and social history reviewed and updated as indicated. Interim medical history since our last visit reviewed. Allergies and medications reviewed and updated.  Review of Systems  Constitutional: Negative for chills and fever.  Eyes: Negative for visual disturbance.  Respiratory: Negative for shortness of breath and wheezing.   Cardiovascular: Negative for chest pain and leg swelling.  Musculoskeletal: Negative for back pain and gait problem.  Skin: Negative for rash.  Neurological: Negative for dizziness, weakness and numbness.  All other systems reviewed and are negative.   Per HPI unless specifically indicated above   Allergies as of 11/07/2020      Reactions   Lipitor [atorvastatin] Shortness Of Breath      Medication List       Accurate as of November 07, 2020 10:43 AM. If you have any questions, ask your nurse or doctor.        cetirizine 10 MG tablet Commonly known as: ZYRTEC Take 1 tablet (10 mg total) by mouth daily.   cyclobenzaprine 10 MG tablet Commonly known as: FLEXERIL Take 1 tablet (10 mg total) by mouth 3 (three) times daily as needed for muscle spasms.   DULoxetine 60 MG capsule Commonly known as: Cymbalta Take 1 capsule (60 mg total) by mouth daily. What changed: Another medication with the same  name was removed. Continue taking this medication, and follow the directions you see here. Changed by: Fransisca Kaufmann Char Feltman, MD   fluticasone 50 MCG/ACT nasal spray Commonly known as: FLONASE Place 1 spray into both nostrils daily.   hydrOXYzine 25 MG tablet Commonly known as: ATARAX/VISTARIL Take 1 tablet (25 mg total) by mouth every 8 (eight) hours as needed for anxiety.   lisinopril 10 MG tablet Commonly known as: ZESTRIL Take 1 tablet (10 mg total) by mouth daily.   lithium carbonate 450 MG CR tablet Commonly known as: ESKALITH Take 2 tablets (900 mg total) by mouth at bedtime.   metFORMIN 1000 MG tablet Commonly known as: GLUCOPHAGE Take 1  tablet (1,000 mg total) by mouth 2 (two) times daily with a meal.   rosuvastatin 5 MG tablet Commonly known as: Crestor Take 1 tablet (5 mg total) by mouth daily.   tamsulosin 0.4 MG Caps capsule Commonly known as: FLOMAX Take 1 capsule (0.4 mg total) by mouth daily.        Objective:   BP 135/83   Pulse 83   Ht 6' (1.829 m)   Wt 232 lb (105.2 kg)   SpO2 97%   BMI 31.46 kg/m   Wt Readings from Last 3 Encounters:  11/07/20 232 lb (105.2 kg)  08/17/20 241 lb 9.6 oz (109.6 kg)  06/15/20 239 lb (108.4 kg)    Physical Exam Vitals and nursing note reviewed.  Constitutional:      General: He is not in acute distress.    Appearance: He is well-developed. He is not diaphoretic.  Eyes:     General: No scleral icterus.    Conjunctiva/sclera: Conjunctivae normal.  Neck:     Thyroid: No thyromegaly.  Cardiovascular:     Rate and Rhythm: Normal rate and regular rhythm.     Heart sounds: Normal heart sounds. No murmur heard.   Pulmonary:     Effort: Pulmonary effort is normal. No respiratory distress.     Breath sounds: Normal breath sounds. No wheezing.  Musculoskeletal:        General: Normal range of motion.     Cervical back: Neck supple.  Lymphadenopathy:     Cervical: No cervical adenopathy.  Skin:    General: Skin is warm and dry.     Findings: No rash.  Neurological:     Mental Status: He is alert and oriented to person, place, and time.     Coordination: Coordination normal.  Psychiatric:        Behavior: Behavior normal.       Assessment & Plan:   Problem List Items Addressed This Visit      Cardiovascular and Mediastinum   Hypertension associated with diabetes ([place])   Relevant Medications   rosuvastatin (CRESTOR) 5 MG tablet   Other Relevant Orders   CBC with Differential/Platelet     Endocrine   Type 2 diabetes mellitus without complication, without long-term current use of insulin (HCC) - Primary   Relevant Medications   rosuvastatin  (CRESTOR) 5 MG tablet   Other Relevant Orders   CMP14+EGFR   CBC with Differential/Platelet   Bayer DCA Hb A1c Waived   Hyperlipidemia associated with type 2 diabetes mellitus (HCC)   Relevant Medications   rosuvastatin (CRESTOR) 5 MG tablet   Other Relevant Orders   Lipid panel     Other   Bipolar 1 disorder, mixed, moderate (HCC)   Relevant Orders   Thyroid Panel With TSH    Other Visit  Diagnoses    Benign prostatic hyperplasia without lower urinary tract symptoms       Relevant Medications   tamsulosin (FLOMAX) 0.4 MG CAPS capsule      Refill his Cymbalta for him for Dr.Ross talk with our office.  He will call them for the lithium.  We will check to see where his A1c is today.  Follow-up in 3 months Follow up plan: Return in about 3 months (around 02/06/2021), or if symptoms worsen or fail to improve, for Diabetes recheck.  Counseling provided for all of the vaccine components Orders Placed This Encounter  Procedures  . CMP14+EGFR  . CBC with Differential/Platelet  . Lipid panel  . Bayer DCA Hb A1c Waived  . Thyroid Panel With TSH    Caryl Pina, MD [facility] 11/07/2020, 10:43 AM

## 2020-11-08 LAB — CBC WITH DIFFERENTIAL/PLATELET
Basophils Absolute: 0 10*3/uL (ref 0.0–0.2)
Basos: 0 %
EOS (ABSOLUTE): 0.3 10*3/uL (ref 0.0–0.4)
Eos: 2 %
Hematocrit: 49.5 % (ref 37.5–51.0)
Hemoglobin: 15.9 g/dL (ref 13.0–17.7)
Immature Grans (Abs): 0 10*3/uL (ref 0.0–0.1)
Immature Granulocytes: 0 %
Lymphocytes Absolute: 3.2 10*3/uL — ABNORMAL HIGH (ref 0.7–3.1)
Lymphs: 29 %
MCH: 29.3 pg (ref 26.6–33.0)
MCHC: 32.1 g/dL (ref 31.5–35.7)
MCV: 91 fL (ref 79–97)
Monocytes Absolute: 1.6 10*3/uL — ABNORMAL HIGH (ref 0.1–0.9)
Monocytes: 14 %
Neutrophils Absolute: 6 10*3/uL (ref 1.4–7.0)
Neutrophils: 55 %
Platelets: 404 10*3/uL (ref 150–450)
RBC: 5.42 x10E6/uL (ref 4.14–5.80)
RDW: 12.5 % (ref 11.6–15.4)
WBC: 11.1 10*3/uL — ABNORMAL HIGH (ref 3.4–10.8)

## 2020-11-08 LAB — THYROID PANEL WITH TSH
Free Thyroxine Index: 2.6 (ref 1.2–4.9)
T3 Uptake Ratio: 26 % (ref 24–39)
T4, Total: 9.9 ug/dL (ref 4.5–12.0)
TSH: 1.58 u[IU]/mL (ref 0.450–4.500)

## 2020-11-08 LAB — CMP14+EGFR
ALT: 18 IU/L (ref 0–44)
AST: 17 IU/L (ref 0–40)
Albumin/Globulin Ratio: 1.5 (ref 1.2–2.2)
Albumin: 4.3 g/dL (ref 3.8–4.8)
Alkaline Phosphatase: 140 IU/L — ABNORMAL HIGH (ref 44–121)
BUN/Creatinine Ratio: 19 (ref 10–24)
BUN: 16 mg/dL (ref 8–27)
Bilirubin Total: 0.3 mg/dL (ref 0.0–1.2)
CO2: 20 mmol/L (ref 20–29)
Calcium: 10.1 mg/dL (ref 8.6–10.2)
Chloride: 96 mmol/L (ref 96–106)
Creatinine, Ser: 0.83 mg/dL (ref 0.76–1.27)
Globulin, Total: 2.8 g/dL (ref 1.5–4.5)
Glucose: 338 mg/dL — ABNORMAL HIGH (ref 65–99)
Potassium: 5.1 mmol/L (ref 3.5–5.2)
Sodium: 135 mmol/L (ref 134–144)
Total Protein: 7.1 g/dL (ref 6.0–8.5)
eGFR: 100 mL/min/{1.73_m2} (ref >59)

## 2020-11-08 LAB — LIPID PANEL
Chol/HDL Ratio: 3.3 ratio (ref 0.0–5.0)
Cholesterol, Total: 154 mg/dL (ref 100–199)
HDL: 47 mg/dL (ref >39)
LDL Chol Calc (NIH): 84 mg/dL (ref 0–99)
Triglycerides: 132 mg/dL (ref 0–149)
VLDL Cholesterol Cal: 23 mg/dL (ref 5–40)

## 2020-11-09 ENCOUNTER — Telehealth (HOSPITAL_COMMUNITY): Admitting: Psychiatry

## 2020-11-14 ENCOUNTER — Telehealth (HOSPITAL_COMMUNITY): Admitting: Psychiatry

## 2020-11-15 ENCOUNTER — Encounter: Payer: Self-pay | Admitting: Family Medicine

## 2020-11-16 ENCOUNTER — Other Ambulatory Visit: Payer: Self-pay

## 2020-11-16 ENCOUNTER — Telehealth: Payer: Self-pay

## 2020-11-16 ENCOUNTER — Telehealth (HOSPITAL_COMMUNITY): Admitting: Psychiatry

## 2020-11-16 DIAGNOSIS — E118 Type 2 diabetes mellitus with unspecified complications: Secondary | ICD-10-CM

## 2020-11-16 DIAGNOSIS — I10 Essential (primary) hypertension: Secondary | ICD-10-CM

## 2020-11-16 DIAGNOSIS — E119 Type 2 diabetes mellitus without complications: Secondary | ICD-10-CM

## 2020-11-16 NOTE — Telephone Encounter (Signed)
LMTCB/ww 

## 2020-11-22 ENCOUNTER — Other Ambulatory Visit: Payer: Self-pay

## 2020-11-22 ENCOUNTER — Telehealth: Payer: Self-pay

## 2020-11-22 DIAGNOSIS — N400 Enlarged prostate without lower urinary tract symptoms: Secondary | ICD-10-CM

## 2020-11-22 MED ORDER — DULOXETINE HCL 60 MG PO CPEP: 60.0000 mg | ORAL_CAPSULE | Freq: Every day | ORAL | 0 refills | Status: DC

## 2020-11-22 MED ORDER — ROSUVASTATIN CALCIUM 5 MG PO TABS: 5.0000 mg | ORAL_TABLET | Freq: Every day | ORAL | 3 refills | Status: DC

## 2020-11-22 MED ORDER — LISINOPRIL 10 MG PO TABS: 10.0000 mg | ORAL_TABLET | Freq: Every day | ORAL | 1 refills | Status: DC

## 2020-11-22 MED ORDER — TAMSULOSIN HCL 0.4 MG PO CAPS: 0.4000 mg | ORAL_CAPSULE | Freq: Every day | ORAL | 3 refills | Status: DC

## 2020-11-22 MED ORDER — METFORMIN HCL 1000 MG PO TABS: 1000.0000 mg | ORAL_TABLET | Freq: Two times a day (BID) | ORAL | 1 refills | Status: DC

## 2020-11-22 NOTE — Telephone Encounter (Signed)
Prescriptions have been cancelled with [facility] and sent to [facility].

## 2020-11-22 NOTE — Telephone Encounter (Signed)
Stew called from [facility] stating that he spoke with patient and patient wants all of his Rx's sent to [facility]. Please void scripts that were sent to [facility] and send to [facility].

## 2020-11-23 ENCOUNTER — Telehealth (HOSPITAL_COMMUNITY): Payer: Self-pay | Admitting: Psychiatry

## 2020-11-23 ENCOUNTER — Other Ambulatory Visit: Payer: Self-pay | Admitting: Psychiatry

## 2020-11-23 MED ORDER — LITHIUM CARBONATE ER 450 MG PO TBCR: 900.0000 mg | EXTENDED_RELEASE_TABLET | Freq: Every day | ORAL | 0 refills | Status: DC

## 2020-11-23 NOTE — Telephone Encounter (Signed)
Ordered

## 2020-11-23 NOTE — Telephone Encounter (Signed)
Patient called requesting refill on LIthium, advised may not be called in until tomorrow morning due to the time of day, but will send to provider. Patient has a new pharmacy as well: [facility] [phone].

## 2020-11-24 ENCOUNTER — Other Ambulatory Visit: Payer: Self-pay | Admitting: Psychiatry

## 2020-11-24 ENCOUNTER — Other Ambulatory Visit (HOSPITAL_COMMUNITY): Payer: Self-pay | Admitting: Psychiatry

## 2020-11-24 MED ORDER — LITHIUM CARBONATE ER 450 MG PO TBCR: 900.0000 mg | EXTENDED_RELEASE_TABLET | Freq: Every day | ORAL | 0 refills | Status: DC

## 2020-11-24 NOTE — Telephone Encounter (Signed)
I resent it, I think it went through

## 2020-11-24 NOTE — Telephone Encounter (Signed)
Ordered again. If this does not go through either, I will defer this to Dr. Tenny Craw, who I believe is in the office today. Let me know if she is not.

## 2020-11-29 ENCOUNTER — Other Ambulatory Visit: Payer: Self-pay

## 2020-11-29 ENCOUNTER — Telehealth (HOSPITAL_COMMUNITY): Admitting: Psychiatry

## 2020-12-05 ENCOUNTER — Telehealth (HOSPITAL_COMMUNITY): Payer: Self-pay | Admitting: Psychiatry

## 2020-12-05 NOTE — Telephone Encounter (Signed)
Called to schedule f/u appt, not able to leave vm

## 2021-01-04 ENCOUNTER — Encounter (HOSPITAL_COMMUNITY): Payer: Self-pay | Admitting: Psychiatry

## 2021-01-04 ENCOUNTER — Other Ambulatory Visit: Payer: Self-pay

## 2021-01-04 ENCOUNTER — Telehealth (INDEPENDENT_AMBULATORY_CARE_PROVIDER_SITE_OTHER): Admitting: Psychiatry

## 2021-01-04 DIAGNOSIS — F3162 Bipolar disorder, current episode mixed, moderate: Secondary | ICD-10-CM

## 2021-01-04 MED ORDER — DULOXETINE HCL 60 MG PO CPEP: 60.0000 mg | ORAL_CAPSULE | Freq: Every day | ORAL | 2 refills | Status: DC

## 2021-01-04 MED ORDER — HYDROXYZINE HCL 25 MG PO TABS: 25.0000 mg | ORAL_TABLET | Freq: Three times a day (TID) | ORAL | 2 refills | Status: DC | PRN

## 2021-01-04 MED ORDER — LITHIUM CARBONATE ER 450 MG PO TBCR: 900.0000 mg | EXTENDED_RELEASE_TABLET | Freq: Every day | ORAL | 2 refills | Status: DC

## 2021-01-04 NOTE — Progress Notes (Signed)
Virtual Visit via Telephone Note  I connected with Adam Oneal on 01/04/21 at 10:40 AM EDT by telephone and verified that I am speaking with the correct person using two identifiers.  Location: Patient: home Provider: home office   I discussed the limitations, risks, security and privacy concerns of performing an evaluation and management service by telephone and the availability of in person appointments. I also discussed with the patient that there may be a patient responsible charge related to this service. The patient expressed understanding and agreed to proceed.     I discussed the assessment and treatment plan with the patient. The patient was provided an opportunity to ask questions and all were answered. The patient agreed with the plan and demonstrated an understanding of the instructions.   The patient was advised to call back or seek an in-person evaluation if the symptoms worsen or if the condition fails to improve as anticipated.  I provided 15 minutes of non-face-to-face time during this encounter.   Diannia Ruder, MD  [facility] MD/PA/NP OP Progress Note  01/04/2021 10:58 AM Adam Oneal  MRN:  [id number]  Chief Complaint:  Chief Complaint    Depression; Manic Behavior; Follow-up     HPI: This patient is a 59 year old widowed white male who is living with 2 roommates in [place].  He was working for [employer] but recently retired.  The patient returns for follow-up after about 5 months.  He states that his wife died in 10/07/22 of respiratory failure.  They have been together for 36 years.  He states that he was really sad in the beginning and drank fairly heavily for about a months.  This wrecked havoc with his blood sugar but he is now quit and his blood sugars are coming down.  He states that he was depressed after she died but over the last few weeks he has been feeling better.  The patient has been staying busy working on his property and taking care of his  parents.  He states that his brothers and sisters call him daily to check on him.  He is eating and sleeping well and his energy is good.  He seems surprisingly upbeat for someone who just lost a partner.  He denies any thoughts of self-harm or suicidal ideation.  He denies racing thoughts or manic symptoms. Visit Diagnosis:    ICD-10-CM   1. Bipolar 1 disorder, mixed, moderate (HCC)  F31.62     Past Psychiatric History: Long-term outpatient treatment for bipolar disorder  Past Medical History:  Past Medical History:  Diagnosis Date  . Acid reflux   . Anxiety 01/1998  . Cataract 02/2004  . Depression 01/1998  . Diabetes mellitus without complication (HCC) 12/1999  . Diabetes mellitus, type II (HCC)   . Hyperlipidemia 11/2006  . Hypertension   . Hypertension    History reviewed. No pertinent surgical history.  Family Psychiatric History: see below  Family History:  Family History  Problem Relation Age of Onset  . Cancer Mother 62       lung  . Heart disease Father   . Hyperlipidemia Father   . Diabetes Brother   . Leukemia Brother   . Diabetes Paternal Grandmother   . Heart disease Paternal Grandmother   . Alcohol abuse Paternal Grandfather   . Diabetes Brother   . Depression Maternal Aunt     Social History:  Social History   Socioeconomic History  . Marital status: Married    Spouse  name: Not on file  . Number of children: Not on file  . Years of education: Not on file  . Highest education level: Not on file  Occupational History  . Not on file  Tobacco Use  . Smoking status: Former Smoker    Packs/day: 1.00    Years: 12.00    Pack years: 12.00    Types: Cigarettes    Start date: 01/05/1978    Quit date: 12/28/1989    Years since quitting: 31.0  . Smokeless tobacco: Former Clinical biochemist  . Vaping Use: Never used  Substance and Sexual Activity  . Alcohol use: No    Alcohol/week: 0.0 standard drinks    Comment: 03-26-16 per pt no  . Drug use: No     Comment: 03-26-16 per pt currently no but use to  . Sexual activity: Never  Other Topics Concern  . Not on file  Social History Narrative  . Not on file   Social Determinants of Health   Financial Resource Strain: Not on file  Food Insecurity: Not on file  Transportation Needs: Not on file  Physical Activity: Not on file  Stress: Not on file  Social Connections: Not on file    Allergies:  Allergies  Allergen Reactions  . Lipitor [Atorvastatin] Shortness Of Breath    Metabolic Disorder Labs: Lab Results  Component Value Date   HGBA1C 11.7 (H) 11/07/2020   No results found for: PROLACTIN Lab Results  Component Value Date   CHOL 154 11/07/2020   TRIG 132 11/07/2020   HDL 47 11/07/2020   CHOLHDL 3.3 11/07/2020   LDLCALC 84 11/07/2020   LDLCALC 127 (H) 06/15/2020   Lab Results  Component Value Date   TSH 1.580 11/07/2020   TSH 0.96 02/09/2019    Therapeutic Level Labs: Lab Results  Component Value Date   LITHIUM 0.3 (L) 02/09/2019   LITHIUM 0.4 (L) 04/10/2018   No results found for: VALPROATE No components found for:  CBMZ  Current Medications: Current Outpatient Medications  Medication Sig Dispense Refill  . cetirizine (ZYRTEC) 10 MG tablet Take 1 tablet (10 mg total) by mouth daily. 90 tablet 3  . cyclobenzaprine (FLEXERIL) 10 MG tablet Take 1 tablet (10 mg total) by mouth 3 (three) times daily as needed for muscle spasms. 270 tablet 3  . DULoxetine (CYMBALTA) 60 MG capsule Take 1 capsule (60 mg total) by mouth daily. 90 capsule 2  . fluticasone (FLONASE) 50 MCG/ACT nasal spray Place 1 spray into both nostrils daily.    . hydrOXYzine (ATARAX/VISTARIL) 25 MG tablet Take 1 tablet (25 mg total) by mouth every 8 (eight) hours as needed for anxiety. 30 tablet 2  . lisinopril (ZESTRIL) 10 MG tablet Take 1 tablet (10 mg total) by mouth daily. 90 tablet 1  . lithium carbonate (ESKALITH) 450 MG CR tablet Take 2 tablets (900 mg total) by mouth at bedtime. 180 tablet 2   . metFORMIN (GLUCOPHAGE) 1000 MG tablet Take 1 tablet (1,000 mg total) by mouth 2 (two) times daily with a meal. 180 tablet 1  . rosuvastatin (CRESTOR) 5 MG tablet Take 1 tablet (5 mg total) by mouth daily. 90 tablet 3  . tamsulosin (FLOMAX) 0.4 MG CAPS capsule Take 1 capsule (0.4 mg total) by mouth daily. 90 capsule 3   No current facility-administered medications for this visit.     Musculoskeletal: Strength & Muscle Tone: within normal limits Gait & Station: normal Patient leans: N/A  Psychiatric Specialty Exam: Review  of Systems  All other systems reviewed and are negative.   There were no vitals taken for this visit.There is no height or weight on file to calculate BMI.  General Appearance: NA  Eye Contact:  NA  Speech:  Clear and Coherent  Volume:  Normal  Mood:  Euthymic  Affect:  NA  Thought Process:  Goal Directed  Orientation:  Full (Time, Place, and Person)  Thought Content: Rumination   Suicidal Thoughts:  No  Homicidal Thoughts:  No  Memory:  Immediate;   Good Recent;   Good Remote;   Good  Judgement:  Fair  Insight:  Fair  Psychomotor Activity:  Normal  Concentration:  Concentration: Good and Attention Span: Good  Recall:  Good  Fund of Knowledge: Good  Language: Good  Akathisia:  No  Handed:  Right  AIMS (if indicated): not done  Assets:  Communication Skills Desire for Improvement Resilience Social Support Talents/Skills  ADL's:  Impaired  Cognition: WNL  Sleep:  Good   Screenings: PHQ2-9   Flowsheet Row Video Visit [facility]04[facility]2021 [facility] Visit from 11/07/2020 in [facility] Office Visit from 08/17/2020 in [facility] Office Visit from 06/15/2020 in [facility] Office Visit from 01/20/2020 in [facility]  PHQ-2 Total Score 0 1 0 0 0    Flowsheet Row Video Visit from 01/04/2021 [facility]  C-SSRS RISK CATEGORY No Risk       Assessment and Plan: This patient is a 60 year old male with a history of bipolar disorder.  Despite his wife's recent death he seems to be holding his own.  He had recent lab work and lipids and blood sugar were elevated but thyroid functions were normal.  He still has not gotten the lithium level and we will try to get this done this week.  He will continue lithium carbonate 900 mg daily for mood stabilization, Cymbalta 60 mg daily for depression and hydroxyzine 25 mg daily as needed for anxiety.  He will return to see me in 3 months   Diannia Ruder, MD 01/04/2021, 10:58 AM

## 2021-01-05 ENCOUNTER — Other Ambulatory Visit (HOSPITAL_COMMUNITY): Payer: Self-pay | Admitting: Psychiatry

## 2021-01-05 DIAGNOSIS — F3162 Bipolar disorder, current episode mixed, moderate: Secondary | ICD-10-CM

## 2021-02-02 NOTE — Progress Notes (Signed)
Spoke with to f/u on when he'll get his labs completed. Per pt he have to see his PCP for a f/u and he'll get his Lithium labs completed at that time. Per pt his f/u with PCP is July 16th.

## 2021-02-02 NOTE — Progress Notes (Signed)
Staff spoke with patient to see when he'll get his labs completed. Patient stated PCP will completed it on 02-11-21 when he goes in for his f/u.

## 2021-02-08 ENCOUNTER — Ambulatory Visit: Admitting: Family Medicine

## 2021-02-08 ENCOUNTER — Encounter: Payer: Self-pay | Admitting: Family Medicine

## 2021-02-08 ENCOUNTER — Other Ambulatory Visit: Payer: Self-pay

## 2021-02-08 VITALS — BP 149/81 | HR 87 | Ht 72.0 in | Wt 224.0 lb

## 2021-02-08 DIAGNOSIS — E1169 Type 2 diabetes mellitus with other specified complication: Secondary | ICD-10-CM | POA: Diagnosis not present

## 2021-02-08 DIAGNOSIS — E1159 Type 2 diabetes mellitus with other circulatory complications: Secondary | ICD-10-CM

## 2021-02-08 DIAGNOSIS — I152 Hypertension secondary to endocrine disorders: Secondary | ICD-10-CM

## 2021-02-08 DIAGNOSIS — E785 Hyperlipidemia, unspecified: Secondary | ICD-10-CM

## 2021-02-08 DIAGNOSIS — E118 Type 2 diabetes mellitus with unspecified complications: Secondary | ICD-10-CM

## 2021-02-08 DIAGNOSIS — E119 Type 2 diabetes mellitus without complications: Secondary | ICD-10-CM | POA: Diagnosis not present

## 2021-02-08 DIAGNOSIS — F3162 Bipolar disorder, current episode mixed, moderate: Secondary | ICD-10-CM | POA: Diagnosis not present

## 2021-02-08 LAB — BAYER DCA HB A1C WAIVED: HB A1C (BAYER DCA - WAIVED): 8.3 % — ABNORMAL HIGH (ref <7.0)

## 2021-02-08 MED ORDER — EMPAGLIFLOZIN 25 MG PO TABS: 25.0000 mg | ORAL_TABLET | Freq: Every day | ORAL | 3 refills | Status: DC

## 2021-02-08 NOTE — Progress Notes (Signed)
BP (!) 149/81   Pulse 87   Ht 6' (1.829 m)   Wt 224 lb (101.6 kg)   SpO2 98%   BMI 30.38 kg/m    Subjective:   Patient ID: Adam Oneal, male    DOB: [birth year], 60 y.o.   MRN: [id number]  HPI: Adam Oneal is a 60 y.o. male presenting on 02/08/2021 for Medical Management of Chronic Issues   HPI Type 2 diabetes mellitus Patient comes in today for recheck of his diabetes. Patient has been currently taking metformin, we had tried some Jardiance but he never got, will send. Patient is currently on an ACE inhibitor/ARB. Patient has not seen an ophthalmologist this year. Patient denies any issues with their feet. The symptom started onset as an adult hypertension and hyperlipidemia ARE RELATED TO DM   Hypertension Patient is currently on lisinopril, and their blood pressure today is 128/86. Patient denies any lightheadedness or dizziness. Patient denies headaches, blurred vision, chest pains, shortness of breath, or weakness. Denies any side effects from medication and is content with current medication.  Hyperlipidemia Patient is coming in for recheck of his hyperlipidemia. The patient is currently taking Crestor. They deny any issues with myalgias or history of liver damage from it. They deny any focal numbness or weakness or chest pain.   Patient sees Dr. Tenny Craw for bipolar management and is on lithium and they want a lithium level checked today with her blood work.  Relevant past medical, surgical, family and social history reviewed and updated as indicated. Interim medical history since our last visit reviewed. Allergies and medications reviewed and updated.  Review of Systems  Constitutional:  Negative for chills and fever.  Eyes:  Negative for visual disturbance.  Respiratory:  Negative for shortness of breath and wheezing.   Cardiovascular:  Negative for chest pain and leg swelling.  Musculoskeletal:  Negative for back pain and gait problem.  Skin:  Negative for rash.   All other systems reviewed and are negative.  Per HPI unless specifically indicated above   Allergies as of 02/08/2021       Reactions   Lipitor [atorvastatin] Shortness Of Breath        Medication List        Accurate as of February 08, 2021 11:32 AM. If you have any questions, ask your nurse or doctor.          cetirizine 10 MG tablet Commonly known as: ZYRTEC Take 1 tablet (10 mg total) by mouth daily.   cyclobenzaprine 10 MG tablet Commonly known as: FLEXERIL Take 1 tablet (10 mg total) by mouth 3 (three) times daily as needed for muscle spasms.   DULoxetine 60 MG capsule Commonly known as: Cymbalta Take 1 capsule (60 mg total) by mouth daily.   empagliflozin 25 MG Tabs tablet Commonly known as: Jardiance Take 1 tablet (25 mg total) by mouth daily before breakfast. Started by: Elige Radon Dmiya Malphrus, MD   fluticasone 50 MCG/ACT nasal spray Commonly known as: FLONASE Place 1 spray into both nostrils daily.   hydrOXYzine 25 MG tablet Commonly known as: ATARAX/VISTARIL Take 1 tablet (25 mg total) by mouth every 8 (eight) hours as needed for anxiety.   lisinopril 10 MG tablet Commonly known as: ZESTRIL Take 1 tablet (10 mg total) by mouth daily.   lithium carbonate 450 MG CR tablet Commonly known as: ESKALITH Take 2 tablets (900 mg total) by mouth at bedtime.   metFORMIN 1000 MG tablet Commonly known as: GLUCOPHAGE  Take 1 tablet (1,000 mg total) by mouth 2 (two) times daily with a meal.   rosuvastatin 5 MG tablet Commonly known as: Crestor Take 1 tablet (5 mg total) by mouth daily.   tamsulosin 0.4 MG Caps capsule Commonly known as: FLOMAX Take 1 capsule (0.4 mg total) by mouth daily.         Objective:   BP (!) 149/81   Pulse 87   Ht 6' (1.829 m)   Wt 224 lb (101.6 kg)   SpO2 98%   BMI 30.38 kg/m   Wt Readings from Last 3 Encounters:  02/08/21 224 lb (101.6 kg)  11/07/20 232 lb (105.2 kg)  08/17/20 241 lb 9.6 oz (109.6 kg)    Physical  Exam Vitals and nursing note reviewed.  Constitutional:      General: He is not in acute distress.    Appearance: He is well-developed. He is not diaphoretic.  Eyes:     General: No scleral icterus.       Right eye: No discharge.     Conjunctiva/sclera: Conjunctivae normal.     Pupils: Pupils are equal, round, and reactive to light.  Neck:     Thyroid: No thyromegaly.  Cardiovascular:     Rate and Rhythm: Normal rate and regular rhythm.     Heart sounds: Normal heart sounds. No murmur heard. Pulmonary:     Effort: Pulmonary effort is normal. No respiratory distress.     Breath sounds: Normal breath sounds. No wheezing.  Musculoskeletal:        General: Normal range of motion.     Cervical back: Neck supple.  Lymphadenopathy:     Cervical: No cervical adenopathy.  Skin:    General: Skin is warm and dry.     Findings: No rash.  Neurological:     Mental Status: He is alert and oriented to person, place, and time.     Coordination: Coordination normal.  Psychiatric:        Behavior: Behavior normal.      Assessment & Plan:   Problem List Items Addressed This Visit       Cardiovascular and Mediastinum   Hypertension associated with diabetes (HCC)   Relevant Medications   empagliflozin (JARDIANCE) 25 MG TABS tablet     Endocrine   Type 2 diabetes mellitus without complication, without long-term current use of insulin (HCC)   Relevant Medications   empagliflozin (JARDIANCE) 25 MG TABS tablet   Hyperlipidemia associated with type 2 diabetes mellitus (HCC)   Relevant Medications   empagliflozin (JARDIANCE) 25 MG TABS tablet     Other   Bipolar 1 disorder, mixed, moderate (HCC)   Relevant Orders   Lithium level   Other Visit Diagnoses     Controlled type 2 diabetes mellitus with complication, without long-term current use of insulin (HCC)    -  Primary   Relevant Medications   empagliflozin (JARDIANCE) 25 MG TABS tablet   Other Relevant Orders   Bayer DCA Hb A1c  Waived       Patient psychiatrist wants to check a lithium level with her blood work today.  A1c is 8.3 which is up which we knew, we can try taking Jardiance but we had not gotten through to him.  We will send the Jardiance now and have him start taking it.  Warned him of the side effects. Follow up plan: Return in about 3 months (around 05/11/2021), or if symptoms worsen or fail to improve, for Diabetes recheck.  Counseling provided for all of the vaccine components Orders Placed This Encounter  Procedures   Bayer DCA Hb A1c Waived   Lithium level    Arville Care, MD [facility] 02/08/2021, 11:32 AM

## 2021-02-09 LAB — LITHIUM LEVEL: Lithium Lvl: 0.5 mmol/L (ref 0.5–1.2)

## 2021-02-13 ENCOUNTER — Telehealth: Payer: Self-pay | Admitting: Psychiatry

## 2021-02-13 NOTE — Telephone Encounter (Signed)
-----   Message from Christella Noa, [place] sent at 02/13/2021  5:24 PM EDT -----  ----- Message ----- From: Jomarie Longs, MD Sent: 02/13/2021  11:10 AM EDT To: Myrlene Broker, MD   Reviewed Dierdre Searles level. Since patient is stable on this dose, will continue same. He does not need refill at this time. He could continue follow up as scheduled . Will route to Dr.Ross. ----- Message ----- From: Christella Noa, RMA Sent: 02/13/2021  10:16 AM EDT To: Jomarie Longs, MD  Medication refill ----- Message ----- From: Quay Burow, LPN Sent: [id number]/45/3646  10:23 AM EDT To: Myrlene Broker, MD  Result forwarded to patient's psychiatrist

## 2021-02-13 NOTE — Telephone Encounter (Signed)
We will route this message to Dr. Tenny Craw.  Reviewed lithium level.  Okay to continue the medication at this dose since he is stable.

## 2021-02-20 NOTE — Telephone Encounter (Signed)
Agree with above 

## 2021-03-27 ENCOUNTER — Other Ambulatory Visit: Payer: Self-pay | Admitting: Family Medicine

## 2021-03-27 MED ORDER — CYCLOBENZAPRINE HCL 10 MG PO TABS: 10.0000 mg | ORAL_TABLET | Freq: Three times a day (TID) | ORAL | 3 refills | Status: DC | PRN

## 2021-03-27 NOTE — Telephone Encounter (Signed)
NA/NVM refill sent to pharmacy 

## 2021-03-27 NOTE — Telephone Encounter (Signed)
  Prescription Request  03/27/2021  What is the name of the medication or equipment? Cyclobenzaprine  Have you contacted your pharmacy to request a refill? Yes  Which pharmacy would you like this sent to? [facility]  Pt says he had a visit with Dr Dettinger last month and was asked if he needed a refill on his Cyclobenzaprine but pt said no because he thought he had plenty since he only takes them PRN. Pt says he was wrong. He is out of the medicine and needs refill called in ASAP. Pt says he is hurting today.

## 2021-04-26 ENCOUNTER — Telehealth (HOSPITAL_COMMUNITY): Payer: Self-pay | Admitting: *Deleted

## 2021-04-26 NOTE — Telephone Encounter (Signed)
Staff called patient to sch f/u and was not able to reach patient. Patient did not have voicemail and phone kept ringing. Pre-recording states call can not be completed at this time. Staff called number on file x3.

## 2021-05-02 ENCOUNTER — Telehealth: Payer: Self-pay | Admitting: *Deleted

## 2021-05-02 DIAGNOSIS — E119 Type 2 diabetes mellitus without complications: Secondary | ICD-10-CM

## 2021-05-02 DIAGNOSIS — N400 Enlarged prostate without lower urinary tract symptoms: Secondary | ICD-10-CM

## 2021-05-02 DIAGNOSIS — E118 Type 2 diabetes mellitus with unspecified complications: Secondary | ICD-10-CM

## 2021-05-02 DIAGNOSIS — I10 Essential (primary) hypertension: Secondary | ICD-10-CM

## 2021-05-02 MED ORDER — METFORMIN HCL 1000 MG PO TABS: 1000.0000 mg | ORAL_TABLET | Freq: Two times a day (BID) | ORAL | 0 refills | Status: DC

## 2021-05-02 MED ORDER — EMPAGLIFLOZIN 25 MG PO TABS: 25.0000 mg | ORAL_TABLET | Freq: Every day | ORAL | 0 refills | Status: DC

## 2021-05-02 MED ORDER — TAMSULOSIN HCL 0.4 MG PO CAPS
0.4000 mg | ORAL_CAPSULE | Freq: Every day | ORAL | 0 refills | Status: DC
Start: 2021-05-02 — End: 2021-05-29

## 2021-05-02 MED ORDER — ROSUVASTATIN CALCIUM 5 MG PO TABS
5.0000 mg | ORAL_TABLET | Freq: Every day | ORAL | 0 refills | Status: DC
Start: 2021-05-02 — End: 2021-05-29

## 2021-05-02 MED ORDER — LISINOPRIL 10 MG PO TABS: 10.0000 mg | ORAL_TABLET | Freq: Every day | ORAL | 0 refills | Status: DC

## 2021-05-02 NOTE — Telephone Encounter (Signed)
Rtc to pt, he is having to change to mail order [facility], he has an appt on 05/11/21 sent in 90 d supply of meds.

## 2021-05-11 ENCOUNTER — Ambulatory Visit: Admitting: Family Medicine

## 2021-05-29 ENCOUNTER — Ambulatory Visit (INDEPENDENT_AMBULATORY_CARE_PROVIDER_SITE_OTHER): Admitting: Family Medicine

## 2021-05-29 ENCOUNTER — Other Ambulatory Visit: Payer: Self-pay

## 2021-05-29 ENCOUNTER — Encounter: Payer: Self-pay | Admitting: Family Medicine

## 2021-05-29 VITALS — BP 131/76 | HR 85 | Ht 72.0 in | Wt 215.0 lb

## 2021-05-29 DIAGNOSIS — E1159 Type 2 diabetes mellitus with other circulatory complications: Secondary | ICD-10-CM

## 2021-05-29 DIAGNOSIS — Z1211 Encounter for screening for malignant neoplasm of colon: Secondary | ICD-10-CM | POA: Diagnosis not present

## 2021-05-29 DIAGNOSIS — E118 Type 2 diabetes mellitus with unspecified complications: Secondary | ICD-10-CM

## 2021-05-29 DIAGNOSIS — E1169 Type 2 diabetes mellitus with other specified complication: Secondary | ICD-10-CM

## 2021-05-29 DIAGNOSIS — I10 Essential (primary) hypertension: Secondary | ICD-10-CM | POA: Diagnosis not present

## 2021-05-29 DIAGNOSIS — I152 Hypertension secondary to endocrine disorders: Secondary | ICD-10-CM

## 2021-05-29 DIAGNOSIS — N400 Enlarged prostate without lower urinary tract symptoms: Secondary | ICD-10-CM

## 2021-05-29 DIAGNOSIS — E785 Hyperlipidemia, unspecified: Secondary | ICD-10-CM

## 2021-05-29 DIAGNOSIS — E119 Type 2 diabetes mellitus without complications: Secondary | ICD-10-CM

## 2021-05-29 DIAGNOSIS — F3162 Bipolar disorder, current episode mixed, moderate: Secondary | ICD-10-CM

## 2021-05-29 LAB — BAYER DCA HB A1C WAIVED: HB A1C (BAYER DCA - WAIVED): 6.3 % — ABNORMAL HIGH (ref 4.8–5.6)

## 2021-05-29 MED ORDER — TAMSULOSIN HCL 0.4 MG PO CAPS: 0.4000 mg | ORAL_CAPSULE | Freq: Every day | ORAL | 3 refills | Status: DC

## 2021-05-29 MED ORDER — EMPAGLIFLOZIN 25 MG PO TABS: 25.0000 mg | ORAL_TABLET | Freq: Every day | ORAL | 3 refills | Status: DC

## 2021-05-29 MED ORDER — ROSUVASTATIN CALCIUM 5 MG PO TABS: 5.0000 mg | ORAL_TABLET | Freq: Every day | ORAL | 3 refills | Status: DC

## 2021-05-29 MED ORDER — DULOXETINE HCL 60 MG PO CPEP: 60.0000 mg | ORAL_CAPSULE | Freq: Every day | ORAL | 0 refills | Status: DC

## 2021-05-29 MED ORDER — METFORMIN HCL 1000 MG PO TABS: 1000.0000 mg | ORAL_TABLET | Freq: Two times a day (BID) | ORAL | 3 refills | Status: DC

## 2021-05-29 NOTE — Progress Notes (Signed)
BP 131/76   Pulse 85   Ht 6' (1.829 m)   Wt 215 lb (97.5 kg)   SpO2 97%   BMI 29.16 kg/m    Subjective:   Patient ID: Adam Oneal, male    DOB: 03/05/1961, 60 y.o.   MRN: [id number]  HPI: Adam Oneal is a 60 y.o. male presenting on 05/29/2021 for Medical Management of Chronic Issues and Diabetes   HPI Type 2 diabetes mellitus Patient comes in today for recheck of his diabetes. Patient has been currently taking metformin and Jardiance. Patient is currently on an ACE inhibitor/ARB. Patient has not seen an ophthalmologist this year. Patient denies any issues with their feet. The symptom started onset as an adult hypertension and hyperlipidemia ARE RELATED TO DM   Hypertension Patient is currently on lisinopril 10, and their blood pressure today is 69/34. Patient denies any lightheadedness or dizziness. Patient denies headaches, blurred vision, chest pains, shortness of breath, or weakness. Denies any side effects from medication and is content with current medication.   Hyperlipidemia Patient is coming in for recheck of his hyperlipidemia. The patient is currently taking Crestor. They deny any issues with myalgias or history of liver damage from it. They deny any focal numbness or weakness or chest pain.   Bipolar and depression Patient has been taking Cymbalta and lithium from his psychiatrist.  He says he ran out of his Cymbalta and he is feeling the difference from it.  He still plan on following up with his psychiatrist but wonders if he can get a refill for less of that.  He denies any suicidal ideations.  Patient is slightly manic and says he cannot of his medicine more than 2 weeks but he cannot recall because of the way that he is feeling. Depression screen [facility] 2/9 05/29/2021 02/08/2021 02/08/2021 11/07/2020 08/17/2020  Decreased Interest 0 0 0 0 0  Down, Depressed, Hopeless 0 0 0 1 0  PHQ - 2 Score 0 0 0 1 0  Altered sleeping 0 0 - - -  Tired, decreased energy 0 0 - - -   Change in appetite 0 2 - - -  Feeling bad or failure about yourself  0 0 - - -  Trouble concentrating 0 0 - - -  Moving slowly or fidgety/restless 0 0 - - -  Suicidal thoughts 0 0 - - -  PHQ-9 Score - 2 - - -  Some encounter information is confidential and restricted. Go to Review Flowsheets activity to see all data.     Relevant past medical, surgical, family and social history reviewed and updated as indicated. Interim medical history since our last visit reviewed. Allergies and medications reviewed and updated.  Review of Systems  Constitutional:  Negative for chills and fever.  Eyes:  Negative for visual disturbance.  Respiratory:  Negative for shortness of breath and wheezing.   Cardiovascular:  Negative for chest pain and leg swelling.  Musculoskeletal:  Negative for arthralgias, back pain and gait problem.  Skin:  Negative for rash.  All other systems reviewed and are negative.  Per HPI unless specifically indicated above   Allergies as of 05/29/2021       Reactions   Lipitor [atorvastatin] Shortness Of Breath        Medication List        Accurate as of May 29, 2021 11:24 AM. If you have any questions, ask your nurse or doctor.  STOP taking these medications    lisinopril 10 MG tablet Commonly known as: ZESTRIL Stopped by: Fransisca Kaufmann Shantavia Jha, MD       TAKE these medications    cetirizine 10 MG tablet Commonly known as: ZYRTEC Take 1 tablet (10 mg total) by mouth daily.   cyclobenzaprine 10 MG tablet Commonly known as: FLEXERIL Take 1 tablet (10 mg total) by mouth 3 (three) times daily as needed for muscle spasms.   DULoxetine 60 MG capsule Commonly known as: Cymbalta Take 1 capsule (60 mg total) by mouth daily.   empagliflozin 25 MG Tabs tablet Commonly known as: Jardiance Take 1 tablet (25 mg total) by mouth daily before breakfast.   fluticasone 50 MCG/ACT nasal spray Commonly known as: FLONASE Place 1 spray into both  nostrils daily.   hydrOXYzine 25 MG tablet Commonly known as: ATARAX/VISTARIL Take 1 tablet (25 mg total) by mouth every 8 (eight) hours as needed for anxiety.   lithium carbonate 450 MG CR tablet Commonly known as: ESKALITH Take 2 tablets (900 mg total) by mouth at bedtime.   metFORMIN 1000 MG tablet Commonly known as: GLUCOPHAGE Take 1 tablet (1,000 mg total) by mouth 2 (two) times daily with a meal.   rosuvastatin 5 MG tablet Commonly known as: Crestor Take 1 tablet (5 mg total) by mouth daily.   tamsulosin 0.4 MG Caps capsule Commonly known as: FLOMAX Take 1 capsule (0.4 mg total) by mouth daily.         Objective:   BP 131/76   Pulse 85   Ht 6' (1.829 m)   Wt 215 lb (97.5 kg)   SpO2 97%   BMI 29.16 kg/m   Wt Readings from Last 3 Encounters:  05/29/21 215 lb (97.5 kg)  02/08/21 224 lb (101.6 kg)  11/07/20 232 lb (105.2 kg)    Physical Exam Vitals and nursing note reviewed.  Constitutional:      General: He is not in acute distress.    Appearance: He is well-developed. He is not diaphoretic.  Eyes:     General: No scleral icterus.       Right eye: No discharge.     Conjunctiva/sclera: Conjunctivae normal.     Pupils: Pupils are equal, round, and reactive to light.  Neck:     Thyroid: No thyromegaly.  Cardiovascular:     Rate and Rhythm: Normal rate and regular rhythm.     Heart sounds: Normal heart sounds. No murmur heard. Pulmonary:     Effort: Pulmonary effort is normal. No respiratory distress.     Breath sounds: Normal breath sounds. No wheezing.  Musculoskeletal:        General: Normal range of motion.     Cervical back: Neck supple.  Lymphadenopathy:     Cervical: No cervical adenopathy.  Skin:    General: Skin is warm and dry.     Findings: No rash.  Neurological:     Mental Status: He is alert and oriented to person, place, and time.     Coordination: Coordination normal.  Psychiatric:        Behavior: Behavior normal.       Assessment & Plan:   Problem List Items Addressed This Visit       Cardiovascular and Mediastinum   Hypertension associated with diabetes ([place])   Relevant Medications   empagliflozin (JARDIANCE) 25 MG TABS tablet   metFORMIN (GLUCOPHAGE) 1000 MG tablet   rosuvastatin (CRESTOR) 5 MG tablet     Endocrine  Type 2 diabetes mellitus without complication, without long-term current use of insulin (HCC)   Relevant Medications   empagliflozin (JARDIANCE) 25 MG TABS tablet   metFORMIN (GLUCOPHAGE) 1000 MG tablet   rosuvastatin (CRESTOR) 5 MG tablet   Hyperlipidemia associated with type 2 diabetes mellitus (HCC)   Relevant Medications   empagliflozin (JARDIANCE) 25 MG TABS tablet   metFORMIN (GLUCOPHAGE) 1000 MG tablet   rosuvastatin (CRESTOR) 5 MG tablet   Other Relevant Orders   CBC with Differential/Platelet   CMP14+EGFR   Lipid panel   Bayer DCA Hb A1c Waived     Other   Bipolar 1 disorder, mixed, moderate ([place])   Other Visit Diagnoses     Controlled type 2 diabetes mellitus with complication, without long-term current use of insulin (HCC)    -  Primary   Relevant Medications   empagliflozin (JARDIANCE) 25 MG TABS tablet   metFORMIN (GLUCOPHAGE) 1000 MG tablet   rosuvastatin (CRESTOR) 5 MG tablet   Other Relevant Orders   CBC with Differential/Platelet   CMP14+EGFR   Lipid panel   Bayer DCA Hb A1c Waived   Essential hypertension, benign       Relevant Medications   rosuvastatin (CRESTOR) 5 MG tablet   Other Relevant Orders   CBC with Differential/Platelet   CMP14+EGFR   Lipid panel   Bayer DCA Hb A1c Waived   Colon cancer screening       Benign prostatic hyperplasia without lower urinary tract symptoms       Relevant Medications   tamsulosin (FLOMAX) 0.4 MG CAPS capsule       A1c is 6.3, looks good.  Blood pressure was down initially but recheck was normal, recommend that he still hold his lisinopril for now  Recheck was normal on the pressure, I would  still hold the lisinopril for now. Follow up plan: Return in about 3 months (around 08/29/2021), or if symptoms worsen or fail to improve, for Diabetes recheck.  Counseling provided for all of the vaccine components Orders Placed This Encounter  Procedures   CBC with Differential/Platelet   CMP14+EGFR   Lipid panel   Bayer DCA Hb A1c Waived    Caryl Pina, MD [facility] 05/29/2021, 11:24 AM

## 2021-05-30 ENCOUNTER — Other Ambulatory Visit: Payer: Self-pay

## 2021-05-30 ENCOUNTER — Telehealth: Payer: Self-pay

## 2021-05-30 DIAGNOSIS — E875 Hyperkalemia: Secondary | ICD-10-CM

## 2021-05-30 LAB — CBC WITH DIFFERENTIAL/PLATELET
Basophils Absolute: 0.1 10*3/uL (ref 0.0–0.2)
Basos: 1 %
EOS (ABSOLUTE): 0.4 10*3/uL (ref 0.0–0.4)
Eos: 3 %
Hematocrit: 52.3 % — ABNORMAL HIGH (ref 37.5–51.0)
Hemoglobin: 17.4 g/dL (ref 13.0–17.7)
Immature Grans (Abs): 0 10*3/uL (ref 0.0–0.1)
Immature Granulocytes: 0 %
Lymphocytes Absolute: 5.1 10*3/uL — ABNORMAL HIGH (ref 0.7–3.1)
Lymphs: 35 %
MCH: 30 pg (ref 26.6–33.0)
MCHC: 33.3 g/dL (ref 31.5–35.7)
MCV: 90 fL (ref 79–97)
Monocytes Absolute: 2.1 10*3/uL — ABNORMAL HIGH (ref 0.1–0.9)
Monocytes: 14 %
Neutrophils Absolute: 7 10*3/uL (ref 1.4–7.0)
Neutrophils: 47 %
Platelets: 561 10*3/uL — ABNORMAL HIGH (ref 150–450)
RBC: 5.8 x10E6/uL (ref 4.14–5.80)
RDW: 13.1 % (ref 11.6–15.4)
WBC: 14.7 10*3/uL — ABNORMAL HIGH (ref 3.4–10.8)

## 2021-05-30 LAB — CMP14+EGFR
ALT: 146 IU/L — ABNORMAL HIGH (ref 0–44)
AST: 52 IU/L — ABNORMAL HIGH (ref 0–40)
Albumin/Globulin Ratio: 1.5 (ref 1.2–2.2)
Albumin: 4.7 g/dL (ref 3.8–4.8)
Alkaline Phosphatase: 82 IU/L (ref 44–121)
BUN/Creatinine Ratio: 13 (ref 10–24)
BUN: 11 mg/dL (ref 8–27)
Bilirubin Total: <0.2 mg/dL (ref 0.0–1.2)
CO2: 25 mmol/L (ref 20–29)
Calcium: 10.9 mg/dL — ABNORMAL HIGH (ref 8.6–10.2)
Chloride: 103 mmol/L (ref 96–106)
Creatinine, Ser: 0.87 mg/dL (ref 0.76–1.27)
Globulin, Total: 3.2 g/dL (ref 1.5–4.5)
Glucose: 99 mg/dL (ref 70–99)
Potassium: 6.9 mmol/L (ref 3.5–5.2)
Sodium: 142 mmol/L (ref 134–144)
Total Protein: 7.9 g/dL (ref 6.0–8.5)
eGFR: 98 mL/min/1.73

## 2021-05-30 LAB — LIPID PANEL
Chol/HDL Ratio: 6.2 ratio — ABNORMAL HIGH (ref 0.0–5.0)
Cholesterol, Total: 211 mg/dL — ABNORMAL HIGH (ref 100–199)
HDL: 34 mg/dL — ABNORMAL LOW (ref >39)
LDL Chol Calc (NIH): 119 mg/dL — ABNORMAL HIGH (ref 0–99)
Triglycerides: 331 mg/dL — ABNORMAL HIGH (ref 0–149)
VLDL Cholesterol Cal: 58 mg/dL — ABNORMAL HIGH (ref 5–40)

## 2021-05-30 NOTE — Telephone Encounter (Signed)
Received critical potassium level per [facility]. Contacted patient and stated that he would come back in to the clinic tomorrow and have labs redrawn.

## 2021-06-01 ENCOUNTER — Telehealth: Payer: Self-pay | Admitting: Family Medicine

## 2021-06-01 NOTE — Telephone Encounter (Signed)
Patient aware and verbalized understanding. States he was just not in the best of moods and didn't want to speak with anyone. He was not in the right mind frame. He will come back to get drawn.

## 2021-06-01 NOTE — Telephone Encounter (Signed)
Please let her know to please come back and have his labs drawn, he does not need an appointment but he does have to check in and then they bring him back to the lab but it does not need to be on the schedule, just so labs can be drawn that he has to be checked in.  Please come back and have it rechecked because of the potassium

## 2021-06-02 ENCOUNTER — Other Ambulatory Visit: Payer: Self-pay

## 2021-06-02 ENCOUNTER — Encounter (HOSPITAL_COMMUNITY): Payer: Self-pay | Admitting: Psychiatry

## 2021-06-02 ENCOUNTER — Telehealth (INDEPENDENT_AMBULATORY_CARE_PROVIDER_SITE_OTHER): Admitting: Psychiatry

## 2021-06-02 DIAGNOSIS — F3162 Bipolar disorder, current episode mixed, moderate: Secondary | ICD-10-CM

## 2021-06-02 MED ORDER — LITHIUM CARBONATE ER 450 MG PO TBCR: 900.0000 mg | EXTENDED_RELEASE_TABLET | Freq: Every day | ORAL | 2 refills | Status: DC

## 2021-06-02 MED ORDER — DULOXETINE HCL 60 MG PO CPEP: 60.0000 mg | ORAL_CAPSULE | Freq: Every day | ORAL | 0 refills | Status: DC

## 2021-06-02 MED ORDER — HYDROXYZINE HCL 25 MG PO TABS: 25.0000 mg | ORAL_TABLET | Freq: Three times a day (TID) | ORAL | 2 refills | Status: DC | PRN

## 2021-06-02 NOTE — Progress Notes (Signed)
Virtual Visit via Telephone Note  I connected with Adam Oneal on 06/02/21 at 11:20 AM EDT by telephone and verified that I am speaking with the correct person using two identifiers.  Location: Patient: home Provider: office   I discussed the limitations, risks, security and privacy concerns of performing an evaluation and management service by telephone and the availability of in person appointments. I also discussed with the patient that there may be a patient responsible charge related to this service. The patient expressed understanding and agreed to proceed.       I discussed the assessment and treatment plan with the patient. The patient was provided an opportunity to ask questions and all were answered. The patient agreed with the plan and demonstrated an understanding of the instructions.   The patient was advised to call back or seek an in-person evaluation if the symptoms worsen or if the condition fails to improve as anticipated.  I provided 15 minutes of non-face-to-face time during this encounter.   Diannia Ruder, MD  [facility] MD/PA/NP OP Progress Note  06/02/2021 12:02 PM Adam Oneal  MRN:  [id number]  Chief Complaint:  Chief Complaint   Depression; Manic Behavior; Family Problem    HPI:  This patient is a 61 year old widowed white male who is living with 2 roommates in [place].  He was working for [employer] but recently retired.  The patient returns after 5 months.  He states that he has been a little bit more depressed lately.  He ran out of his Cymbalta several weeks ago.  He states that he has been "a little dizzy.  His potassium was high last week at 6.3 needs.  At recheck he states that he drinks a lot of energy drinks with added potassium.  His primary doctors told him to stop and I agree.  He denies any manic symptoms agitation difficulty sleeping or racing thoughts.  His primary doctor has sent him the Cymbalta.  He is compliant with the lithium  and uses hydroxyzine as needed for anxiety.  He denies any thoughts of self-harm or suicidal ideation actually was joking quite a bit and seem to be in fairly good spirits Visit Diagnosis:    ICD-10-CM   1. Bipolar 1 disorder, mixed, moderate (HCC)  F31.62       Past Psychiatric History: Long-term outpatient treatment for bipolar disorder  Past Medical History:  Past Medical History:  Diagnosis Date   Acid reflux    Anxiety 01/1998   Cataract 02/2004   Depression 01/1998   Diabetes mellitus without complication (HCC) 12/1999   Diabetes mellitus, type II (HCC)    Hyperlipidemia 11/2006   Hypertension    Hypertension    History reviewed. No pertinent surgical history.  Family Psychiatric History: see below  Family History:  Family History  Problem Relation Age of Onset   Cancer Mother 31       lung   Heart disease Father    Hyperlipidemia Father    Diabetes Brother    Leukemia Brother    Diabetes Paternal Grandmother    Heart disease Paternal Grandmother    Alcohol abuse Paternal Grandfather    Diabetes Brother    Depression Maternal Aunt     Social History:  Social History   Socioeconomic History   Marital status: Married    Spouse name: Not on file   Number of children: Not on file   Years of education: Not on file   Highest education level:  Not on file  Occupational History   Not on file  Tobacco Use   Smoking status: Former    Packs/day: 1.00    Years: 12.00    Pack years: 12.00    Types: Cigarettes    Start date: 01/05/1978    Quit date: 12/28/1989    Years since quitting: 31.4   Smokeless tobacco: Former  Building services engineer Use: Never used  Substance and Sexual Activity   Alcohol use: No    Alcohol/week: 0.0 standard drinks    Comment: 03-26-16 per pt no   Drug use: No    Comment: 03-26-16 per pt currently no but use to   Sexual activity: Never  Other Topics Concern   Not on file  Social History Narrative   Not on file   Social Determinants  of Health   Financial Resource Strain: Not on file  Food Insecurity: Not on file  Transportation Needs: Not on file  Physical Activity: Not on file  Stress: Not on file  Social Connections: Not on file    Allergies:  Allergies  Allergen Reactions   Lipitor [Atorvastatin] Shortness Of Breath    Metabolic Disorder Labs: Lab Results  Component Value Date   HGBA1C 6.3 (H) 05/29/2021   No results found for: PROLACTIN Lab Results  Component Value Date   CHOL 211 (H) 05/29/2021   TRIG 331 (H) 05/29/2021   HDL 34 (L) 05/29/2021   CHOLHDL 6.2 (H) 05/29/2021   LDLCALC 119 (H) 05/29/2021   LDLCALC 84 11/07/2020   Lab Results  Component Value Date   TSH 1.580 11/07/2020   TSH 0.96 02/09/2019    Therapeutic Level Labs: Lab Results  Component Value Date   LITHIUM 0.5 02/08/2021   LITHIUM 0.3 (L) 02/09/2019   No results found for: VALPROATE No components found for:  CBMZ  Current Medications: Current Outpatient Medications  Medication Sig Dispense Refill   cetirizine (ZYRTEC) 10 MG tablet Take 1 tablet (10 mg total) by mouth daily. 90 tablet 3   cyclobenzaprine (FLEXERIL) 10 MG tablet Take 1 tablet (10 mg total) by mouth 3 (three) times daily as needed for muscle spasms. 270 tablet 3   DULoxetine (CYMBALTA) 60 MG capsule Take 1 capsule (60 mg total) by mouth daily. 90 capsule 0   empagliflozin (JARDIANCE) 25 MG TABS tablet Take 1 tablet (25 mg total) by mouth daily before breakfast. 90 tablet 3   fluticasone (FLONASE) 50 MCG/ACT nasal spray Place 1 spray into both nostrils daily.     hydrOXYzine (ATARAX/VISTARIL) 25 MG tablet Take 1 tablet (25 mg total) by mouth every 8 (eight) hours as needed for anxiety. 30 tablet 2   lithium carbonate (ESKALITH) 450 MG CR tablet Take 2 tablets (900 mg total) by mouth at bedtime. 180 tablet 2   metFORMIN (GLUCOPHAGE) 1000 MG tablet Take 1 tablet (1,000 mg total) by mouth 2 (two) times daily with a meal. 180 tablet 3   rosuvastatin  (CRESTOR) 5 MG tablet Take 1 tablet (5 mg total) by mouth daily. 90 tablet 3   tamsulosin (FLOMAX) 0.4 MG CAPS capsule Take 1 capsule (0.4 mg total) by mouth daily. 90 capsule 3   No current facility-administered medications for this visit.     Musculoskeletal: Strength & Muscle Tone: na Gait & Station: na Patient leans: N/A  Psychiatric Specialty Exam: Review of Systems  Psychiatric/Behavioral:  Positive for dysphoric mood.   All other systems reviewed and are negative.  There were no vitals  taken for this visit.There is no height or weight on file to calculate BMI.  General Appearance: NA  Eye Contact:  NA  Speech:  Clear and Coherent  Volume:  Normal  Mood:  Dysphoric  Affect:  NA  Thought Process:  Goal Directed  Orientation:  Full (Time, Place, and Person)  Thought Content: Rumination   Suicidal Thoughts:  No  Homicidal Thoughts:  No  Memory:  Immediate;   Good Recent;   Good Remote;   Good  Judgement:  Fair  Insight:  Fair  Psychomotor Activity:  Normal  Concentration:  Concentration: Good and Attention Span: Good  Recall:  Good  Fund of Knowledge: Good  Language: Good  Akathisia:  No  Handed:  Right  AIMS (if indicated): not done  Assets:  Communication Skills Desire for Improvement Physical Health Resilience Social Support  ADL's:  Intact  Cognition: WNL  Sleep:  Good   Screenings: GAD-7    Flowsheet Row Office Visit from 05/29/2021 in [facility] Office Visit from 02/08/2021 in [facility]  Total GAD-7 Score 0 3      PHQ2-9    Flowsheet Row [facility] Office Visit from 05/29/2021 in [facility] Office Visit from 02/08/2021 in [facility] Video Visit from 01/04/2021 in [facility] Office Visit from 11/07/2020 in [facility]   PHQ-2 Total Score 1 0 0 0 1  PHQ-9 Total Score -- -- 2 -- --      Flowsheet Row Video Visit from 06/02/2021 in [facility] Video Visit from 01/04/2021 [facility]  C-SSRS RISK CATEGORY No Risk No Risk        Assessment and Plan: This patient is a 60 year old male with a history of bipolar disorder.  He has been a bit more depressed due to his wife's death as well as getting off the Cymbalta.  He will restart Cymbalta 60 mg daily for depression and continue hydroxyzine 25 mg daily as needed for anxiety and lithium carbonate 900 mg daily for mood stabilization.  He will return to see me in 3 months   Diannia Ruder, MD 06/02/2021, 12:02 PM

## 2021-06-05 ENCOUNTER — Other Ambulatory Visit: Payer: Self-pay

## 2021-06-05 ENCOUNTER — Other Ambulatory Visit

## 2021-06-05 DIAGNOSIS — E875 Hyperkalemia: Secondary | ICD-10-CM

## 2021-06-05 LAB — BMP8+EGFR
BUN/Creatinine Ratio: 15 (ref 10–24)
BUN: 12 mg/dL (ref 8–27)
CO2: 20 mmol/L (ref 20–29)
Calcium: 9.7 mg/dL (ref 8.6–10.2)
Chloride: 105 mmol/L (ref 96–106)
Creatinine, Ser: 0.82 mg/dL (ref 0.76–1.27)
Glucose: 115 mg/dL — ABNORMAL HIGH (ref 70–99)
Potassium: 5.1 mmol/L (ref 3.5–5.2)
Sodium: 140 mmol/L (ref 134–144)
eGFR: 99 mL/min/{1.73_m2} (ref >59)

## 2021-07-27 ENCOUNTER — Other Ambulatory Visit: Payer: Self-pay | Admitting: Family Medicine

## 2021-07-27 DIAGNOSIS — N400 Enlarged prostate without lower urinary tract symptoms: Secondary | ICD-10-CM

## 2021-07-27 DIAGNOSIS — E118 Type 2 diabetes mellitus with unspecified complications: Secondary | ICD-10-CM

## 2021-07-27 DIAGNOSIS — E119 Type 2 diabetes mellitus without complications: Secondary | ICD-10-CM

## 2021-08-30 ENCOUNTER — Ambulatory Visit: Admitting: Family Medicine

## 2021-10-04 ENCOUNTER — Encounter: Payer: Self-pay | Admitting: Family Medicine

## 2021-10-04 ENCOUNTER — Ambulatory Visit (INDEPENDENT_AMBULATORY_CARE_PROVIDER_SITE_OTHER): Admitting: Family Medicine

## 2021-10-04 VITALS — BP 136/92 | HR 87 | Ht 72.0 in | Wt 213.0 lb

## 2021-10-04 DIAGNOSIS — N400 Enlarged prostate without lower urinary tract symptoms: Secondary | ICD-10-CM

## 2021-10-04 DIAGNOSIS — F3162 Bipolar disorder, current episode mixed, moderate: Secondary | ICD-10-CM

## 2021-10-04 DIAGNOSIS — I152 Hypertension secondary to endocrine disorders: Secondary | ICD-10-CM

## 2021-10-04 DIAGNOSIS — I10 Essential (primary) hypertension: Secondary | ICD-10-CM

## 2021-10-04 DIAGNOSIS — E785 Hyperlipidemia, unspecified: Secondary | ICD-10-CM

## 2021-10-04 DIAGNOSIS — E1169 Type 2 diabetes mellitus with other specified complication: Secondary | ICD-10-CM

## 2021-10-04 DIAGNOSIS — E118 Type 2 diabetes mellitus with unspecified complications: Secondary | ICD-10-CM | POA: Diagnosis not present

## 2021-10-04 DIAGNOSIS — E1159 Type 2 diabetes mellitus with other circulatory complications: Secondary | ICD-10-CM

## 2021-10-04 DIAGNOSIS — E119 Type 2 diabetes mellitus without complications: Secondary | ICD-10-CM

## 2021-10-04 LAB — BAYER DCA HB A1C WAIVED: HB A1C (BAYER DCA - WAIVED): 5.9 % — ABNORMAL HIGH (ref 4.8–5.6)

## 2021-10-04 MED ORDER — EMPAGLIFLOZIN 25 MG PO TABS: 25.0000 mg | ORAL_TABLET | Freq: Every day | ORAL | 3 refills | Status: DC

## 2021-10-04 MED ORDER — METFORMIN HCL 1000 MG PO TABS: 1000.0000 mg | ORAL_TABLET | Freq: Two times a day (BID) | ORAL | 3 refills | Status: DC

## 2021-10-04 MED ORDER — FLUTICASONE PROPIONATE 50 MCG/ACT NA SUSP: 1.0000 | Freq: Every day | NASAL | 3 refills | Status: DC

## 2021-10-04 MED ORDER — TAMSULOSIN HCL 0.4 MG PO CAPS: 0.4000 mg | ORAL_CAPSULE | Freq: Every day | ORAL | 3 refills | Status: DC

## 2021-10-04 NOTE — Progress Notes (Signed)
? ?BP (!) 136/92   Pulse 87   Ht 6' (1.829 m)   Wt 213 lb (96.6 kg)   SpO2 96%   BMI 28.89 kg/m?   ? ?Subjective:  ? ?Patient ID: Adam Oneal, male    DOB: [birth year], 61 y.o.   MRN: [id number] ? ?HPI: ?Adam Oneal is a 61 y.o. male presenting on 10/04/2021 for Medical Management of Chronic Issues and Diabetes ? ? ?HPI ?Type 2 diabetes mellitus ?Patient comes in today for recheck of his diabetes. Patient has been currently taking Jardiance and metformin, A1c looks good at 5.9. Patient is not currently on an ACE inhibitor/ARB. Patient has not seen an ophthalmologist this year. Patient denies any issues with their feet. The symptom started onset as an adult hypertension and hyperlipidemia ARE RELATED TO DM  ? ?Hypertension ?Patient is currently on no medication currently diet controlled, and their blood pressure today is 136/92. Patient denies any lightheadedness or dizziness. Patient denies headaches, blurred vision, chest pains, shortness of breath, or weakness. Denies any side effects from medication and is content with current medication.  ? ?Hyperlipidemia ?Patient is coming in for recheck of his hyperlipidemia. The patient is currently taking Crestor. They deny any issues with myalgias or history of liver damage from it. They deny any focal numbness or weakness or chest pain.  ? ?Bipolar ?Patient has bipolar and sees Dr. Harrington Challenger for this. ? ?Relevant past medical, surgical, family and social history reviewed and updated as indicated. Interim medical history since our last visit reviewed. ?Allergies and medications reviewed and updated. ? ?Review of Systems  ?Constitutional:  Negative for chills and fever.  ?Respiratory:  Negative for shortness of breath and wheezing.   ?Cardiovascular:  Negative for chest pain and leg swelling.  ?Musculoskeletal:  Negative for back pain and gait problem.  ?Skin:  Negative for rash.  ?Neurological:  Negative for dizziness, weakness and light-headedness.  ?All other systems  reviewed and are negative. ? ?Per HPI unless specifically indicated above ? ? ?Allergies as of 10/04/2021   ? ?   Reactions  ? Lipitor [atorvastatin] Shortness Of Breath  ? ?  ? ?  ?Medication List  ?  ? ?  ? Accurate as of October 04, 2021  4:18 PM. If you have any questions, ask your nurse or doctor.  ?  ?  ? ?  ? ?cetirizine 10 MG tablet ?Commonly known as: ZYRTEC ?Take 1 tablet (10 mg total) by mouth daily. ?  ?cyclobenzaprine 10 MG tablet ?Commonly known as: FLEXERIL ?Take 1 tablet (10 mg total) by mouth 3 (three) times daily as needed for muscle spasms. ?  ?DULoxetine 60 MG capsule ?Commonly known as: Cymbalta ?Take 1 capsule (60 mg total) by mouth daily. ?  ?fluticasone 50 MCG/ACT nasal spray ?Commonly known as: FLONASE ?Place 1 spray into both nostrils daily. ?  ?hydrOXYzine 25 MG tablet ?Commonly known as: ATARAX ?Take 1 tablet (25 mg total) by mouth every 8 (eight) hours as needed for anxiety. ?  ?Jardiance 25 MG Tabs tablet ?Generic drug: empagliflozin ?TAKE 1 TABLET DAILY BEFORE BREAKFAST ?  ?lithium carbonate 450 MG CR tablet ?Commonly known as: ESKALITH ?Take 2 tablets (900 mg total) by mouth at bedtime. ?  ?metFORMIN 1000 MG tablet ?Commonly known as: GLUCOPHAGE ?TAKE 1 TABLET TWICE A DAY WITH MEALS ?  ?rosuvastatin 5 MG tablet ?Commonly known as: Crestor ?Take 1 tablet (5 mg total) by mouth daily. ?  ?tamsulosin 0.4 MG Caps capsule ?Commonly known as:  [place] ?TAKE 1 CAPSULE DAILY ?  ? ?  ? ? ? ?Objective:  ? ?BP (!) 136/92   Pulse 87   Ht 6' (1.829 m)   Wt 213 lb (96.6 kg)   SpO2 96%   BMI 28.89 kg/m?   ?Wt Readings from Last 3 Encounters:  ?10/04/21 213 lb (96.6 kg)  ?05/29/21 215 lb (97.5 kg)  ?02/08/21 224 lb (101.6 kg)  ?  ?Physical Exam ?Vitals and nursing note reviewed.  ?Constitutional:   ?   General: He is not in acute distress. ?   Appearance: He is well-developed. He is not diaphoretic.  ?Eyes:  ?   General: No scleral icterus. ?   Conjunctiva/sclera: Conjunctivae normal.  ?Neck:  ?    Thyroid: No thyromegaly.  ?Cardiovascular:  ?   Rate and Rhythm: Normal rate and regular rhythm.  ?   Heart sounds: Normal heart sounds. No murmur heard. ?Pulmonary:  ?   Effort: Pulmonary effort is normal. No respiratory distress.  ?   Breath sounds: Normal breath sounds. No wheezing.  ?Musculoskeletal:     ?   General: No swelling. Normal range of motion.  ?   Cervical back: Neck supple.  ?Lymphadenopathy:  ?   Cervical: No cervical adenopathy.  ?Skin: ?   General: Skin is warm and dry.  ?   Findings: No rash.  ?Neurological:  ?   Mental Status: He is alert and oriented to person, place, and time.  ?   Coordination: Coordination normal.  ?Psychiatric:     ?   Behavior: Behavior normal.  ? ? ? ? ?Assessment & Plan:  ? ?Problem List Items Addressed This Visit   ? ?  ? Cardiovascular and Mediastinum  ? Hypertension associated with diabetes ([place])  ?  ? Endocrine  ? Type 2 diabetes mellitus without complication, without long-term current use of insulin ([place])  ? Hyperlipidemia associated with type 2 diabetes mellitus ([place])  ? Relevant Orders  ? CBC with Differential/Platelet  ? CMP14+EGFR  ? Lipid panel  ? Bayer DCA Hb A1c Waived  ? Microalbumin / creatinine urine ratio  ?  ? Other  ? Bipolar 1 disorder, mixed, moderate (HCC)  ? Relevant Orders  ? Lithium level  ? ?Other Visit Diagnoses   ? ? Controlled type 2 diabetes mellitus with complication, without long-term current use of insulin ([place])    -  Primary  ? Relevant Orders  ? CBC with Differential/Platelet  ? CMP14+EGFR  ? Lipid panel  ? Bayer DCA Hb A1c Waived  ? Microalbumin / creatinine urine ratio  ? Essential hypertension, benign      ? Relevant Orders  ? CBC with Differential/Platelet  ? CMP14+EGFR  ? Lipid panel  ? Bayer DCA Hb A1c Waived  ? Microalbumin / creatinine urine ratio  ? Benign prostatic hyperplasia without lower urinary tract symptoms      ? Relevant Orders  ? PSA, total and free  ? ?  ?  ?A1c is 5.9, will check other blood work.  No change in  medications. ?Follow up plan: ?Return in about 3 months (around 01/04/2022), or if symptoms worsen or fail to improve, for Diabetes recheck. ? ?Counseling provided for all of the vaccine components ?Orders Placed This Encounter  ?Procedures  ? CBC with Differential/Platelet  ? CMP14+EGFR  ? Lipid panel  ? Bayer DCA Hb A1c Waived  ? Lithium level  ? PSA, total and free  ? Microalbumin / creatinine urine ratio  ? ? ?  Caryl Pina, MD ?[place] ?10/04/2021, 4:18 PM ? ? ?  ?

## 2021-10-05 LAB — CBC WITH DIFFERENTIAL/PLATELET
Basophils Absolute: 0.1 10*3/uL (ref 0.0–0.2)
Basos: 0 %
EOS (ABSOLUTE): 0.4 10*3/uL (ref 0.0–0.4)
Eos: 3 %
Hematocrit: 51.9 % — ABNORMAL HIGH (ref 37.5–51.0)
Hemoglobin: 16.6 g/dL (ref 13.0–17.7)
Immature Grans (Abs): 0 10*3/uL (ref 0.0–0.1)
Immature Granulocytes: 0 %
Lymphocytes Absolute: 4.1 10*3/uL — ABNORMAL HIGH (ref 0.7–3.1)
Lymphs: 30 %
MCH: 27.9 pg (ref 26.6–33.0)
MCHC: 32 g/dL (ref 31.5–35.7)
MCV: 87 fL (ref 79–97)
Monocytes Absolute: 1.9 10*3/uL — ABNORMAL HIGH (ref 0.1–0.9)
Monocytes: 14 %
Neutrophils Absolute: 7.3 10*3/uL — ABNORMAL HIGH (ref 1.4–7.0)
Neutrophils: 53 %
Platelets: 495 10*3/uL — ABNORMAL HIGH (ref 150–450)
RBC: 5.94 x10E6/uL — ABNORMAL HIGH (ref 4.14–5.80)
RDW: 14.2 % (ref 11.6–15.4)
WBC: 13.8 10*3/uL — ABNORMAL HIGH (ref 3.4–10.8)

## 2021-10-05 LAB — CMP14+EGFR
ALT: 39 IU/L (ref 0–44)
AST: 27 IU/L (ref 0–40)
Albumin/Globulin Ratio: 1.5 (ref 1.2–2.2)
Albumin: 4.3 g/dL (ref 3.8–4.8)
Alkaline Phosphatase: 118 IU/L (ref 44–121)
BUN/Creatinine Ratio: 20 (ref 10–24)
BUN: 19 mg/dL (ref 8–27)
Bilirubin Total: <0.2 mg/dL (ref 0.0–1.2)
CO2: 20 mmol/L (ref 20–29)
Calcium: 10 mg/dL (ref 8.6–10.2)
Chloride: 104 mmol/L (ref 96–106)
Creatinine, Ser: 0.95 mg/dL (ref 0.76–1.27)
Globulin, Total: 2.9 g/dL (ref 1.5–4.5)
Glucose: 150 mg/dL — ABNORMAL HIGH (ref 70–99)
Potassium: 5 mmol/L (ref 3.5–5.2)
Sodium: 141 mmol/L (ref 134–144)
Total Protein: 7.2 g/dL (ref 6.0–8.5)
eGFR: 90 mL/min/{1.73_m2} (ref >59)

## 2021-10-05 LAB — LIPID PANEL
Chol/HDL Ratio: 3.6 ratio (ref 0.0–5.0)
Cholesterol, Total: 183 mg/dL (ref 100–199)
HDL: 51 mg/dL (ref >39)
LDL Chol Calc (NIH): 98 mg/dL (ref 0–99)
Triglycerides: 198 mg/dL — ABNORMAL HIGH (ref 0–149)
VLDL Cholesterol Cal: 34 mg/dL (ref 5–40)

## 2021-10-05 LAB — MICROALBUMIN / CREATININE URINE RATIO
Creatinine, Urine: 39 mg/dL
Microalb/Creat Ratio: 9 mg/g creat (ref 0–29)
Microalbumin, Urine: 3.6 ug/mL

## 2021-10-05 LAB — PSA, TOTAL AND FREE
PSA, Free Pct: 14.3 %
PSA, Free: 0.1 ng/mL
Prostate Specific Ag, Serum: 0.7 ng/mL (ref 0.0–4.0)

## 2021-10-05 LAB — LITHIUM LEVEL: Lithium Lvl: 0.7 mmol/L (ref 0.5–1.2)

## 2021-10-25 ENCOUNTER — Other Ambulatory Visit (HOSPITAL_COMMUNITY): Payer: Self-pay | Admitting: Psychiatry

## 2021-10-25 ENCOUNTER — Other Ambulatory Visit: Payer: Self-pay | Admitting: Family Medicine

## 2021-10-25 DIAGNOSIS — E118 Type 2 diabetes mellitus with unspecified complications: Secondary | ICD-10-CM

## 2021-10-25 DIAGNOSIS — E119 Type 2 diabetes mellitus without complications: Secondary | ICD-10-CM

## 2021-10-25 DIAGNOSIS — N400 Enlarged prostate without lower urinary tract symptoms: Secondary | ICD-10-CM

## 2021-10-25 NOTE — Telephone Encounter (Signed)
Call for appt

## 2021-10-25 NOTE — Telephone Encounter (Signed)
Called patient and he do not have voicemail.  ?

## 2021-10-26 ENCOUNTER — Telehealth (INDEPENDENT_AMBULATORY_CARE_PROVIDER_SITE_OTHER): Admitting: Psychiatry

## 2021-10-26 ENCOUNTER — Encounter (HOSPITAL_COMMUNITY): Payer: Self-pay | Admitting: Psychiatry

## 2021-10-26 DIAGNOSIS — F3162 Bipolar disorder, current episode mixed, moderate: Secondary | ICD-10-CM

## 2021-10-26 MED ORDER — DULOXETINE HCL 60 MG PO CPEP: 60.0000 mg | ORAL_CAPSULE | Freq: Every day | ORAL | 2 refills | Status: DC

## 2021-10-26 MED ORDER — LITHIUM CARBONATE ER 450 MG PO TBCR: 900.0000 mg | EXTENDED_RELEASE_TABLET | Freq: Every day | ORAL | 3 refills | Status: DC

## 2021-10-26 NOTE — Progress Notes (Signed)
Virtual Visit via Video Note ? ?I connected with Adam Oneal on 10/26/21 at 10:00 AM EDT by a video enabled telemedicine application and verified that I am speaking with the correct person using two identifiers. ? ?Location: ?Patient: home ?Provider: office ?  ?I discussed the limitations of evaluation and management by telemedicine and the availability of in person appointments. The patient expressed understanding and agreed to proceed. ? ? ? ?  ?I discussed the assessment and treatment plan with the patient. The patient was provided an opportunity to ask questions and all were answered. The patient agreed with the plan and demonstrated an understanding of the instructions. ?  ?The patient was advised to call back or seek an in-person evaluation if the symptoms worsen or if the condition fails to improve as anticipated. ? ?I provided 15 minutes of non-face-to-face time during this encounter. ? ? ?Diannia Ruder, MD ? ?[facility] MD/PA/NP OP Progress Note ? ?10/26/2021 10:34 AM ?Adam Oneal  ?MRN:  [id number] ? ?Chief Complaint:  ?Chief Complaint  ?Patient presents with  ? Depression  ? Manic Behavior  ? Follow-up  ? ?HPI: This patient is a 61 year old widowed white male who is living in [place].  He was working for [employer] but recently retired. ? ?The patient returns for follow-up after about 5 months he states that overall he is doing well.  He has been eating healthier and has lost about 40 pounds.  His A1c has come down.  He is doing weight lifting and running.  He has been compliant with his medications and denies significant symptoms of depression anxiety insomnia.  He denies irritability anger or racing thoughts.  He also denies suicidal ideation.  His labs were checked this month in primary care and his lithium level is 0.7 and kidney function tests are all normal. ?Visit Diagnosis:  ?  ICD-10-CM   ?1. Bipolar 1 disorder, mixed, moderate (HCC)  F31.62   ?  ? ? ?Past Psychiatric History: Long-term  outpatient treatment for bipolar disorder ? ?Past Medical History:  ?Past Medical History:  ?Diagnosis Date  ? Acid reflux   ? Anxiety 01/1998  ? Cataract 02/2004  ? Depression 01/1998  ? Diabetes mellitus without complication (HCC) 12/1999  ? Diabetes mellitus, type II (HCC)   ? Hyperlipidemia 11/2006  ? Hypertension   ? Hypertension   ? History reviewed. No pertinent surgical history. ? ?Family Psychiatric History: see below ? ?Family History:  ?Family History  ?Problem Relation Age of Onset  ? Cancer Mother 77  ?     lung  ? Heart disease Father   ? Hyperlipidemia Father   ? Diabetes Brother   ? Leukemia Brother   ? Diabetes Paternal Grandmother   ? Heart disease Paternal Grandmother   ? Alcohol abuse Paternal Grandfather   ? Diabetes Brother   ? Depression Maternal Aunt   ? ? ?Social History:  ?Social History  ? ?Socioeconomic History  ? Marital status: Married  ?  Spouse name: Not on file  ? Number of children: Not on file  ? Years of education: Not on file  ? Highest education level: Not on file  ?Occupational History  ? Not on file  ?Tobacco Use  ? Smoking status: Former  ?  Packs/day: 1.00  ?  Years: 12.00  ?  Pack years: 12.00  ?  Types: Cigarettes  ?  Start date: 01/05/1978  ?  Quit date: 12/28/1989  ?  Years since quitting: 31.8  ?  Smokeless tobacco: Former  ?Vaping Use  ? Vaping Use: Never used  ?Substance and Sexual Activity  ? Alcohol use: No  ?  Alcohol/week: 0.0 standard drinks  ?  Comment: 03-26-16 per pt no  ? Drug use: No  ?  Comment: 03-26-16 per pt currently no but use to  ? Sexual activity: Never  ?Other Topics Concern  ? Not on file  ?Social History Narrative  ? Not on file  ? ?Social Determinants of Health  ? ?Financial Resource Strain: Not on file  ?Food Insecurity: Not on file  ?Transportation Needs: Not on file  ?Physical Activity: Not on file  ?Stress: Not on file  ?Social Connections: Not on file  ? ? ?Allergies:  ?Allergies  ?Allergen Reactions  ? Lipitor [Atorvastatin] Shortness Of Breath   ? ? ?Metabolic Disorder Labs: ?Lab Results  ?Component Value Date  ? HGBA1C 5.9 (H) 10/04/2021  ? ?No results found for: PROLACTIN ?Lab Results  ?Component Value Date  ? CHOL 183 10/04/2021  ? TRIG 198 (H) 10/04/2021  ? HDL 51 10/04/2021  ? CHOLHDL 3.6 10/04/2021  ? LDLCALC 98 10/04/2021  ? LDLCALC 119 (H) 05/29/2021  ? ?Lab Results  ?Component Value Date  ? TSH 1.580 11/07/2020  ? TSH 0.96 02/09/2019  ? ? ?Therapeutic Level Labs: ?Lab Results  ?Component Value Date  ? LITHIUM 0.7 10/04/2021  ? LITHIUM 0.5 02/08/2021  ? ?No results found for: VALPROATE ?No components found for:  CBMZ ? ?Current Medications: ?Current Outpatient Medications  ?Medication Sig Dispense Refill  ? cetirizine (ZYRTEC) 10 MG tablet Take 1 tablet (10 mg total) by mouth daily. 90 tablet 3  ? cyclobenzaprine (FLEXERIL) 10 MG tablet Take 1 tablet (10 mg total) by mouth 3 (three) times daily as needed for muscle spasms. 270 tablet 3  ? DULoxetine (CYMBALTA) 60 MG capsule Take 1 capsule (60 mg total) by mouth daily. 90 capsule 2  ? empagliflozin (JARDIANCE) 25 MG TABS tablet Take 1 tablet (25 mg total) by mouth daily before breakfast. 90 tablet 3  ? fluticasone (FLONASE) 50 MCG/ACT nasal spray Place 1 spray into both nostrils daily. 16 g 3  ? hydrOXYzine (ATARAX/VISTARIL) 25 MG tablet Take 1 tablet (25 mg total) by mouth every 8 (eight) hours as needed for anxiety. 30 tablet 2  ? lithium carbonate (ESKALITH) 450 MG CR tablet Take 2 tablets (900 mg total) by mouth at bedtime. 180 tablet 3  ? metFORMIN (GLUCOPHAGE) 1000 MG tablet Take 1 tablet (1,000 mg total) by mouth 2 (two) times daily with a meal. 180 tablet 3  ? rosuvastatin (CRESTOR) 5 MG tablet Take 1 tablet (5 mg total) by mouth daily. 90 tablet 3  ? tamsulosin (FLOMAX) 0.4 MG CAPS capsule Take 1 capsule (0.4 mg total) by mouth daily. 90 capsule 3  ? ?No current facility-administered medications for this visit.  ? ? ? ?Musculoskeletal: ?Strength & Muscle Tone: within normal limits ?Gait &  Station: normal ?Patient leans: N/A ? ?Psychiatric Specialty Exam: ?Review of Systems  ?All other systems reviewed and are negative.  ?There were no vitals taken for this visit.There is no height or weight on file to calculate BMI.  ?General Appearance: Casual and Fairly Groomed  ?Eye Contact:  Good  ?Speech:  Clear and Coherent  ?Volume:  Normal  ?Mood:  Euthymic  ?Affect:  Appropriate and Congruent  ?Thought Process:  Goal Directed  ?Orientation:  Full (Time, Place, and Person)  ?Thought Content: WDL   ?Suicidal Thoughts:  No  ?  Homicidal Thoughts:  No  ?Memory:  Immediate;   Good ?Recent;   Good ?Remote;   Good  ?Judgement:  Good  ?Insight:  Fair  ?Psychomotor Activity:  Normal  ?Concentration:  Concentration: Good and Attention Span: Good  ?Recall:  Good  ?Fund of Knowledge: Good  ?Language: Good  ?Akathisia:  No  ?Handed:  Right  ?AIMS (if indicated): not done  ?Assets:  Communication Skills ?Desire for Improvement ?Physical Health ?Resilience ?Social Support ?Talents/Skills  ?ADL's:  Intact  ?Cognition: WNL  ?Sleep:  Good  ? ?Screenings: ?GAD-7   ? ?Flowsheet Row Office Visit from 10/04/2021 in [facility] Office Visit from 05/29/2021 in [facility] Office Visit from 02/08/2021 in [facility]  ?Total GAD-7 Score 0 0 3  ? ?  ? ?PHQ2-9   ? ?Flowsheet Row Video Visit [facility] 10/26/2021 [facility] Office Visit from 10/04/2021 in [facility] Video Visit from 06/02/2021 in [facility] Office Visit from 05/29/2021 in [facility] Office Visit from 02/08/2021 in [facility]  ?PHQ-2 Total Score 0 1 1 0 0  ?PHQ-9 Total Score -- 1 -- -- 2  ? ?  ? ?Flowsheet Row Video Visit from 10/26/2021 [facility] Video Visit from [facility]/02/2021 [facility] Video Visit from 01/04/2021 in [facility]  ?C-SSRS RISK CATEGORY No Risk No Risk No Risk  ? ?  ? ? ? ?Assessment and Plan: This patient is a 60-ye

## 2022-01-04 ENCOUNTER — Encounter: Payer: Self-pay | Admitting: Family Medicine

## 2022-01-04 ENCOUNTER — Ambulatory Visit: Admitting: Family Medicine

## 2022-01-04 VITALS — BP 146/75 | HR 77 | Temp 98.0°F | Ht 72.0 in | Wt 210.0 lb

## 2022-01-04 DIAGNOSIS — N400 Enlarged prostate without lower urinary tract symptoms: Secondary | ICD-10-CM

## 2022-01-04 DIAGNOSIS — Z23 Encounter for immunization: Secondary | ICD-10-CM

## 2022-01-04 DIAGNOSIS — E1159 Type 2 diabetes mellitus with other circulatory complications: Secondary | ICD-10-CM | POA: Diagnosis not present

## 2022-01-04 DIAGNOSIS — I152 Hypertension secondary to endocrine disorders: Secondary | ICD-10-CM

## 2022-01-04 DIAGNOSIS — E118 Type 2 diabetes mellitus with unspecified complications: Secondary | ICD-10-CM

## 2022-01-04 DIAGNOSIS — E119 Type 2 diabetes mellitus without complications: Secondary | ICD-10-CM

## 2022-01-04 DIAGNOSIS — E1169 Type 2 diabetes mellitus with other specified complication: Secondary | ICD-10-CM | POA: Diagnosis not present

## 2022-01-04 DIAGNOSIS — T466X5A Adverse effect of antihyperlipidemic and antiarteriosclerotic drugs, initial encounter: Secondary | ICD-10-CM

## 2022-01-04 DIAGNOSIS — M791 Myalgia, unspecified site: Secondary | ICD-10-CM

## 2022-01-04 DIAGNOSIS — E785 Hyperlipidemia, unspecified: Secondary | ICD-10-CM

## 2022-01-04 LAB — BAYER DCA HB A1C WAIVED: HB A1C (BAYER DCA - WAIVED): 5.6 % (ref 4.8–5.6)

## 2022-01-04 MED ORDER — METFORMIN HCL 1000 MG PO TABS: 1000.0000 mg | ORAL_TABLET | Freq: Two times a day (BID) | ORAL | 3 refills | Status: DC

## 2022-01-04 MED ORDER — CYCLOBENZAPRINE HCL 10 MG PO TABS: 10.0000 mg | ORAL_TABLET | Freq: Three times a day (TID) | ORAL | 3 refills | Status: DC | PRN

## 2022-01-04 MED ORDER — TAMSULOSIN HCL 0.4 MG PO CAPS: 0.4000 mg | ORAL_CAPSULE | Freq: Every day | ORAL | 1 refills | Status: DC

## 2022-01-04 MED ORDER — EMPAGLIFLOZIN 25 MG PO TABS: 25.0000 mg | ORAL_TABLET | Freq: Every day | ORAL | 3 refills | Status: DC

## 2022-01-04 NOTE — Progress Notes (Signed)
BP (!) 146/75   Pulse 77   Temp 98 F (36.7 C)   Ht 6' (1.829 m)   Wt 210 lb (95.3 kg)   SpO2 97%   BMI 28.48 kg/m    Subjective:   Patient ID: Adam Oneal, male    DOB: Apr 18, 1961, 61 y.o.   MRN: [id number]  HPI: Adam Oneal is a 61 y.o. male presenting on 01/04/2022 for Medical Management of Chronic Issues and Diabetes   HPI Type 2 diabetes mellitus Patient comes in today for recheck of his diabetes. Patient has been currently taking Jardiance and metformin. Patient is not currently on an ACE inhibitor/ARB. Patient has not seen an ophthalmologist this year. Patient denies any issues with their feet. The symptom started onset as an adult hypertension and hyperlipidemia ARE RELATED TO DM   Hypertension Patient is currently on no medicine currently, diet control, and their blood pressure today is 146/75, he says has not been working out as much recently and he feels like that weight is up a little bit.  He will refocus on his diet and exercise. Patient denies any lightheadedness or dizziness. Patient denies headaches, blurred vision, chest pains, shortness of breath, or weakness. Denies any side effects from medication and is content with current medication.   Hyperlipidemia Patient is coming in for recheck of his hyperlipidemia. The patient is currently taking none currently, had intolerance to statins. They deny any issues with myalgias or history of liver damage from it. They deny any focal numbness or weakness or chest pain.   Relevant past medical, surgical, family and social history reviewed and updated as indicated. Interim medical history since our last visit reviewed. Allergies and medications reviewed and updated.  Review of Systems  Constitutional:  Negative for chills and fever.  Eyes:  Negative for visual disturbance.  Respiratory:  Negative for shortness of breath and wheezing.   Cardiovascular:  Negative for chest pain and leg swelling.  Musculoskeletal:   Negative for back pain and gait problem.  Skin:  Negative for rash.  Neurological:  Negative for dizziness and light-headedness.  All other systems reviewed and are negative.   Per HPI unless specifically indicated above   Allergies as of 01/04/2022       Reactions   Lipitor [atorvastatin] Shortness Of Breath        Medication List        Accurate as of January 04, 2022  8:29 AM. If you have any questions, ask your nurse or doctor.          STOP taking these medications    cetirizine 10 MG tablet Commonly known as: ZYRTEC Stopped by: Elige RadonJoshua A Ilisha Blust, MD   rosuvastatin 5 MG tablet Commonly known as: Crestor Stopped by: Nils PyleJoshua A Lilyanne Mcquown, MD       TAKE these medications    cyclobenzaprine 10 MG tablet Commonly known as: FLEXERIL Take 1 tablet (10 mg total) by mouth 3 (three) times daily as needed for muscle spasms.   DULoxetine 60 MG capsule Commonly known as: Cymbalta Take 1 capsule (60 mg total) by mouth daily.   empagliflozin 25 MG Tabs tablet Commonly known as: Jardiance Take 1 tablet (25 mg total) by mouth daily before breakfast. What changed: how much to take Changed by: Elige RadonJoshua A Starkisha Tullis, MD   fluticasone 50 MCG/ACT nasal spray Commonly known as: FLONASE Place 1 spray into both nostrils daily.   hydrOXYzine 25 MG tablet Commonly known as: ATARAX Take 1 tablet (25  mg total) by mouth every 8 (eight) hours as needed for anxiety.   lithium carbonate 450 MG CR tablet Commonly known as: ESKALITH Take 2 tablets (900 mg total) by mouth at bedtime.   metFORMIN 1000 MG tablet Commonly known as: GLUCOPHAGE Take 1 tablet (1,000 mg total) by mouth 2 (two) times daily with a meal.   tamsulosin 0.4 MG Caps capsule Commonly known as: FLOMAX Take 1 capsule (0.4 mg total) by mouth daily.         Objective:   BP (!) 146/75   Pulse 77   Temp 98 F (36.7 C)   Ht 6' (1.829 m)   Wt 210 lb (95.3 kg)   SpO2 97%   BMI 28.48 kg/m   Wt Readings from  Last 3 Encounters:  01/04/22 210 lb (95.3 kg)  10/04/21 213 lb (96.6 kg)  05/29/21 215 lb (97.5 kg)    Physical Exam Vitals and nursing note reviewed.  Constitutional:      General: He is not in acute distress.    Appearance: He is well-developed. He is not diaphoretic.  Eyes:     General: No scleral icterus.    Conjunctiva/sclera: Conjunctivae normal.  Neck:     Thyroid: No thyromegaly.  Cardiovascular:     Rate and Rhythm: Normal rate and regular rhythm.     Heart sounds: Normal heart sounds. No murmur heard. Pulmonary:     Effort: Pulmonary effort is normal. No respiratory distress.     Breath sounds: Normal breath sounds. No wheezing.  Musculoskeletal:        General: No swelling. Normal range of motion.     Cervical back: Neck supple.  Lymphadenopathy:     Cervical: No cervical adenopathy.  Skin:    General: Skin is warm and dry.     Findings: No rash.  Neurological:     Mental Status: He is alert and oriented to person, place, and time.     Coordination: Coordination normal.  Psychiatric:        Behavior: Behavior normal.       Assessment & Plan:   Problem List Items Addressed This Visit       Cardiovascular and Mediastinum   Hypertension associated with diabetes (HCC)   Relevant Medications   empagliflozin (JARDIANCE) 25 MG TABS tablet   metFORMIN (GLUCOPHAGE) 1000 MG tablet     Endocrine   Type 2 diabetes mellitus without complication, without long-term current use of insulin (HCC)   Relevant Medications   empagliflozin (JARDIANCE) 25 MG TABS tablet   metFORMIN (GLUCOPHAGE) 1000 MG tablet   Hyperlipidemia associated with type 2 diabetes mellitus (HCC)   Relevant Medications   empagliflozin (JARDIANCE) 25 MG TABS tablet   metFORMIN (GLUCOPHAGE) 1000 MG tablet   Other Visit Diagnoses     Controlled type 2 diabetes mellitus with complication, without long-term current use of insulin (HCC)    -  Primary   Relevant Medications   empagliflozin  (JARDIANCE) 25 MG TABS tablet   metFORMIN (GLUCOPHAGE) 1000 MG tablet   Other Relevant Orders   Bayer DCA Hb A1c Waived   Benign prostatic hyperplasia without lower urinary tract symptoms       Relevant Medications   tamsulosin (FLOMAX) 0.4 MG CAPS capsule   Myalgia due to statin           Blood pressure slightly elevated today, will monitor closely. A1c is six 5.6 which looks good.  No changes Follow up plan: Return in about 3  months (around 04/06/2022), or if symptoms worsen or fail to improve, for Diabetes recheck.  Counseling provided for all of the vaccine components Orders Placed This Encounter  Procedures   Bayer DCA Hb A1c Waived    Arville Care, MD [facility] 01/04/2022, 8:29 AM

## 2022-01-10 ENCOUNTER — Ambulatory Visit: Admitting: Family Medicine

## 2022-02-19 ENCOUNTER — Telehealth (HOSPITAL_COMMUNITY): Admitting: Psychiatry

## 2022-02-21 ENCOUNTER — Encounter (HOSPITAL_COMMUNITY): Payer: Self-pay | Admitting: Psychiatry

## 2022-02-21 ENCOUNTER — Telehealth (INDEPENDENT_AMBULATORY_CARE_PROVIDER_SITE_OTHER): Admitting: Psychiatry

## 2022-02-21 DIAGNOSIS — F3162 Bipolar disorder, current episode mixed, moderate: Secondary | ICD-10-CM

## 2022-02-21 MED ORDER — DULOXETINE HCL 60 MG PO CPEP: 60.0000 mg | ORAL_CAPSULE | Freq: Every day | ORAL | 2 refills | Status: DC

## 2022-02-21 MED ORDER — LITHIUM CARBONATE ER 450 MG PO TBCR: 900.0000 mg | EXTENDED_RELEASE_TABLET | Freq: Every day | ORAL | 3 refills | Status: DC

## 2022-02-21 MED ORDER — HYDROXYZINE HCL 25 MG PO TABS: 25.0000 mg | ORAL_TABLET | Freq: Three times a day (TID) | ORAL | 2 refills | Status: DC | PRN

## 2022-02-21 NOTE — Progress Notes (Signed)
Virtual Visit via Video Note  I connected with Adam Oneal on 02/21/22 at  8:40 AM EDT by a video enabled telemedicine application and verified that I am speaking with the correct person using two identifiers.  Location: Patient: home Provider: home office   I discussed the limitations of evaluation and management by telemedicine and the availability of in person appointments. The patient expressed understanding and agreed to proceed.     I discussed the assessment and treatment plan with the patient. The patient was provided an opportunity to ask questions and all were answered. The patient agreed with the plan and demonstrated an understanding of the instructions.   The patient was advised to call back or seek an in-person evaluation if the symptoms worsen or if the condition fails to improve as anticipated.  I provided 15 minutes of non-face-to-face time during this encounter.   Diannia Ruder, MD  [facility] MD/PA/NP OP Progress Note  02/21/2022 9:00 AM Adam Oneal  MRN:  [id number]  Chief Complaint:  Chief Complaint  Patient presents with   Anxiety   Manic Behavior   Depression   HPI: This patient is a 61 year old widowed white male who is living in [place].  He was working for [employer] but recently retired.  The patient returns for follow-up after about 4 months.  He states that overall he is doing well.  He continues to work out.  He has been compliant with his medications.  He denies being depressed although he misses his wife who died last year.  He really does not have anyone to be with right now.  He does keep in close contact with family by phone.  He denies irritability anger or racing thoughts.  His sleep is somewhat interrupted but he has not been taking the hydroxyzine and I urged him to do so.  He also states that he is has been buying a lot of things but he does not feel it is due to him to manic symptomology but simply things that he wants for himself for  his cats.  His last lithium level was 0.7 and kidney function tests were normal.  His A1c has come way down to 5.9.  He denies thoughts of self-harm or suicide or auditory visual hallucinations.   Visit Diagnosis:    ICD-10-CM   1. Bipolar 1 disorder, mixed, moderate (HCC)  F31.62       Past Psychiatric History: Long-term outpatient treatment for bipolar disorder  Past Medical History:  Past Medical History:  Diagnosis Date   Acid reflux    Anxiety 01/1998   Cataract 02/2004   Depression 01/1998   Diabetes mellitus without complication (HCC) 12/1999   Diabetes mellitus, type II (HCC)    Hyperlipidemia 11/2006   Hypertension    Hypertension    History reviewed. No pertinent surgical history.  Family Psychiatric History: See below  Family History:  Family History  Problem Relation Age of Onset   Cancer Mother 23       lung   Heart disease Father    Hyperlipidemia Father    Diabetes Brother    Leukemia Brother    Diabetes Paternal Grandmother    Heart disease Paternal Grandmother    Alcohol abuse Paternal Grandfather    Diabetes Brother    Depression Maternal Aunt     Social History:  Social History   Socioeconomic History   Marital status: Widowed    Spouse name: Not on file   Number of  children: Not on file   Years of education: Not on file   Highest education level: Not on file  Occupational History   Not on file  Tobacco Use   Smoking status: Former    Packs/day: 1.00    Years: 12.00    Total pack years: 12.00    Types: Cigarettes    Start date: 01/05/1978    Quit date: 12/28/1989    Years since quitting: 32.1   Smokeless tobacco: Former  Building services engineer Use: Never used  Substance and Sexual Activity   Alcohol use: No    Alcohol/week: 0.0 standard drinks of alcohol    Comment: 03-26-16 per pt no   Drug use: No    Comment: 03-26-16 per pt currently no but use to   Sexual activity: Never  Other Topics Concern   Not on file  Social History  Narrative   Not on file   Social Determinants of Health   Financial Resource Strain: Not on file  Food Insecurity: Not on file  Transportation Needs: Not on file  Physical Activity: Not on file  Stress: Not on file  Social Connections: Not on file    Allergies:  Allergies  Allergen Reactions   Lipitor [Atorvastatin] Shortness Of Breath    Metabolic Disorder Labs: Lab Results  Component Value Date   HGBA1C 5.6 01/04/2022   No results found for: "PROLACTIN" Lab Results  Component Value Date   CHOL 183 10/04/2021   TRIG 198 (H) 10/04/2021   HDL 51 10/04/2021   CHOLHDL 3.6 10/04/2021   LDLCALC 98 10/04/2021   LDLCALC 119 (H) 05/29/2021   Lab Results  Component Value Date   TSH 1.580 11/07/2020   TSH 0.96 02/09/2019    Therapeutic Level Labs: Lab Results  Component Value Date   LITHIUM 0.7 10/04/2021   LITHIUM 0.5 02/08/2021   No results found for: "VALPROATE" No results found for: "CBMZ"  Current Medications: Current Outpatient Medications  Medication Sig Dispense Refill   cyclobenzaprine (FLEXERIL) 10 MG tablet Take 1 tablet (10 mg total) by mouth 3 (three) times daily as needed for muscle spasms. 270 tablet 3   DULoxetine (CYMBALTA) 60 MG capsule Take 1 capsule (60 mg total) by mouth daily. 90 capsule 2   empagliflozin (JARDIANCE) 25 MG TABS tablet Take 1 tablet (25 mg total) by mouth daily before breakfast. 90 tablet 3   fluticasone (FLONASE) 50 MCG/ACT nasal spray Place 1 spray into both nostrils daily. 16 g 3   hydrOXYzine (ATARAX) 25 MG tablet Take 1 tablet (25 mg total) by mouth every 8 (eight) hours as needed for anxiety. 30 tablet 2   lithium carbonate (ESKALITH) 450 MG CR tablet Take 2 tablets (900 mg total) by mouth at bedtime. 180 tablet 3   metFORMIN (GLUCOPHAGE) 1000 MG tablet Take 1 tablet (1,000 mg total) by mouth 2 (two) times daily with a meal. 180 tablet 3   tamsulosin (FLOMAX) 0.4 MG CAPS capsule Take 1 capsule (0.4 mg total) by mouth daily.  90 capsule 1   No current facility-administered medications for this visit.     Musculoskeletal: Strength & Muscle Tone: within normal limits Gait & Station: normal Patient leans: N/A  Psychiatric Specialty Exam: Review of Systems  All other systems reviewed and are negative.   There were no vitals taken for this visit.There is no height or weight on file to calculate BMI.  General Appearance: Casual and Fairly Groomed  Eye Contact:  Good  Speech:  Clear and Coherent  Volume:  Normal  Mood:  Euthymic  Affect:  Appropriate and Congruent  Thought Process:  Goal Directed  Orientation:  Full (Time, Place, and Person)  Thought Content: WDL   Suicidal Thoughts:  No  Homicidal Thoughts:  No  Memory:  Immediate;   Good Recent;   Good Remote;   NA  Judgement:  Good  Insight:  Fair  Psychomotor Activity:  Normal  Concentration:  Concentration: Good and Attention Span: Good  Recall:  Good  Fund of Knowledge: Good  Language: Good  Akathisia:  No  Handed:  Right  AIMS (if indicated): not done  Assets:  Communication Skills Desire for Improvement Physical Health Resilience Social Support Talents/Skills  ADL's:  Intact  Cognition: WNL  Sleep:  Fair   Screenings: GAD-7    Flowsheet Row Office Visit from 01/04/2022 in [facility] Office Visit from 10/04/2021 in [facility] Office Visit from 05/29/2021 in [facility] Office Visit from 02/08/2021 in [facility]  Total GAD-7 Score 0 0 0 3      PHQ2-9    Flowsheet Row [facility] Office Visit from 01/04/2022 in [facility] Video Visit from 10/26/2021 in [facility] Office Visit from 10/04/2021 in [facility] Video Visit from 06/02/2021 [facility]  PHQ-2 Total Score 0 2 0 1 1  PHQ-9 Total Score -- 2 -- 1 --      Flowsheet Row Video Visit from 02/21/2022 in [facility] Video Visit from 10/26/2021 in [facility] Video Visit from 06/02/2021 in [facility]  C-SSRS RISK CATEGORY No Risk No Risk No Risk        Assessment and Plan: This patient is a 61 year old male with a history of bipolar disorder.  Although he seems to be overspending a bit he feels like he can manage it.  He is doing well on his current regimen.  He will continue lithium carbonate 900 mg at bedtime for mood stabilization, Cymbalta 60 mg daily for depression and hydroxyzine 25 mg at bedtime as needed for sleep.  He will return to see me in 4 months  Collaboration of Care: Collaboration of Care: Primary Care Provider AEB notes are available to PCP on the epic system  Patient/Guardian was advised Release of Information must be obtained prior to any record release in order to collaborate their care with an outside provider. Patient/Guardian was advised if they have not already done so to contact the registration department to sign all necessary forms in order for [place] to release information regarding their care.   Consent: Patient/Guardian gives verbal consent for treatment and assignment of benefits for services provided during this visit. Patient/Guardian expressed understanding and agreed to proceed.    Diannia Ruder, MD 02/21/2022, 9:01 AM

## 2022-02-26 ENCOUNTER — Ambulatory Visit: Admitting: Family Medicine

## 2022-03-02 NOTE — Progress Notes (Signed)
Patient no showed

## 2022-03-22 ENCOUNTER — Telehealth (INDEPENDENT_AMBULATORY_CARE_PROVIDER_SITE_OTHER): Payer: Self-pay | Admitting: Psychiatry

## 2022-03-22 ENCOUNTER — Encounter (HOSPITAL_COMMUNITY): Payer: Self-pay

## 2022-03-22 DIAGNOSIS — Z91199 Patient's noncompliance with other medical treatment and regimen due to unspecified reason: Secondary | ICD-10-CM

## 2022-04-14 ENCOUNTER — Encounter (INDEPENDENT_AMBULATORY_CARE_PROVIDER_SITE_OTHER): Payer: Self-pay

## 2022-04-16 ENCOUNTER — Ambulatory Visit: Admitting: Family Medicine

## 2022-04-16 NOTE — Progress Notes (Signed)
No show

## 2022-05-28 ENCOUNTER — Encounter (HOSPITAL_COMMUNITY): Payer: Self-pay

## 2022-05-28 ENCOUNTER — Telehealth (HOSPITAL_COMMUNITY): Admitting: Psychiatry

## 2022-06-01 ENCOUNTER — Telehealth (HOSPITAL_COMMUNITY): Admitting: Psychiatry

## 2022-06-01 ENCOUNTER — Encounter (HOSPITAL_COMMUNITY): Payer: Self-pay | Admitting: Psychiatry

## 2022-06-01 DIAGNOSIS — F3162 Bipolar disorder, current episode mixed, moderate: Secondary | ICD-10-CM | POA: Diagnosis not present

## 2022-06-01 MED ORDER — LITHIUM CARBONATE ER 450 MG PO TBCR
900.0000 mg | EXTENDED_RELEASE_TABLET | ORAL | 3 refills | Status: DC
Start: 2022-06-01 — End: 2022-09-03

## 2022-06-01 MED ORDER — HYDROXYZINE HCL 25 MG PO TABS: 25.0000 mg | ORAL_TABLET | Freq: Three times a day (TID) | ORAL | 2 refills | Status: DC | PRN

## 2022-06-01 MED ORDER — DULOXETINE HCL 60 MG PO CPEP: 60.0000 mg | ORAL_CAPSULE | Freq: Every day | ORAL | 2 refills | Status: DC

## 2022-06-01 NOTE — Progress Notes (Signed)
Virtual Visit via Video Note  I connected with Adam Oneal on 06/01/22 at 10:20 AM EDT by a video enabled telemedicine application and verified that I am speaking with the correct person using two identifiers.  Location: Patient: home Provider: office   I discussed the limitations of evaluation and management by telemedicine and the availability of in person appointments. The patient expressed understanding and agreed to proceed.     I discussed the assessment and treatment plan with the patient. The patient was provided an opportunity to ask questions and all were answered. The patient agreed with the plan and demonstrated an understanding of the instructions.   The patient was advised to call back or seek an in-person evaluation if the symptoms worsen or if the condition fails to improve as anticipated.  I provided 20 minutes of non-face-to-face time during this encounter.   Levonne Spiller, MD  [facility] MD/PA/NP OP Progress Note  06/01/2022 10:30 AM Adam Oneal  MRN:  [id number]  Chief Complaint:  Chief Complaint  Patient presents with   Depression   Manic Behavior   Follow-up   HPI:  This patient is a 61 year-old widowed white male who is living in [place].  He was working for [employer] but recently retired.   Returns for follow-up after 3 months.  He admits that about a month ago or so he started drinking and got up to 24 beers a day.  He claims he did this because "I miss my wife."  His wife passed away about 2 years ago.  He stopped 2 weeks ago because it made him feel bad and it really was not helping.  He is feeling much better now.  He is starting to get his energy back and is planning to start working out and walking again.  He denies significant depression or anxiety or racing thoughts.  He is sleeping well.  He has been compliant with all of his medications. Visit Diagnosis:    ICD-10-CM   1. Bipolar 1 disorder, mixed, moderate ([place])  F31.62       Past  Psychiatric History: Long-term outpatient treatment for bipolar disorder  Past Medical History:  Past Medical History:  Diagnosis Date   Acid reflux    Anxiety 01/1998   Cataract 02/2004   Depression 01/1998   Diabetes mellitus without complication ([place]) 66/4403   Diabetes mellitus, type II ([place])    Hyperlipidemia 11/2006   Hypertension    Hypertension    History reviewed. No pertinent surgical history.  Family Psychiatric History: see below  Family History:  Family History  Problem Relation Age of Onset   Cancer Mother 74       lung   Heart disease Father    Hyperlipidemia Father    Diabetes Brother    Leukemia Brother    Diabetes Paternal Grandmother    Heart disease Paternal Grandmother    Alcohol abuse Paternal Grandfather    Diabetes Brother    Depression Maternal Aunt     Social History:  Social History   Socioeconomic History   Marital status: Widowed    Spouse name: Not on file   Number of children: Not on file   Years of education: Not on file   Highest education level: Not on file  Occupational History   Not on file  Tobacco Use   Smoking status: Former    Packs/day: 1.00    Years: 12.00    Total pack years: 12.00  Types: Cigarettes    Start date: 01/05/1978    Quit date: 12/28/1989    Years since quitting: 32.4   Smokeless tobacco: Former  Building services engineer Use: Never used  Substance and Sexual Activity   Alcohol use: No    Alcohol/week: 0.0 standard drinks of alcohol    Comment: 03-26-16 per pt no   Drug use: No    Comment: 03-26-16 per pt currently no but use to   Sexual activity: Never  Other Topics Concern   Not on file  Social History Narrative   Not on file   Social Determinants of Health   Financial Resource Strain: Not on file  Food Insecurity: Not on file  Transportation Needs: Not on file  Physical Activity: Not on file  Stress: Not on file  Social Connections: Not on file    Allergies:  Allergies  Allergen Reactions    Lipitor [Atorvastatin] Shortness Of Breath    Metabolic Disorder Labs: Lab Results  Component Value Date   HGBA1C 5.6 01/04/2022   No results found for: "PROLACTIN" Lab Results  Component Value Date   CHOL 183 10/04/2021   TRIG 198 (H) 10/04/2021   HDL 51 10/04/2021   CHOLHDL 3.6 10/04/2021   LDLCALC 98 10/04/2021   LDLCALC 119 (H) 05/29/2021   Lab Results  Component Value Date   TSH 1.580 11/07/2020   TSH 0.96 02/09/2019    Therapeutic Level Labs: Lab Results  Component Value Date   LITHIUM 0.7 10/04/2021   LITHIUM 0.5 02/08/2021   No results found for: "VALPROATE" No results found for: "CBMZ"  Current Medications: Current Outpatient Medications  Medication Sig Dispense Refill   cyclobenzaprine (FLEXERIL) 10 MG tablet Take 1 tablet (10 mg total) by mouth 3 (three) times daily as needed for muscle spasms. 270 tablet 3   DULoxetine (CYMBALTA) 60 MG capsule Take 1 capsule (60 mg total) by mouth daily. 90 capsule 2   empagliflozin (JARDIANCE) 25 MG TABS tablet Take 1 tablet (25 mg total) by mouth daily before breakfast. 90 tablet 3   fluticasone (FLONASE) 50 MCG/ACT nasal spray Place 1 spray into both nostrils daily. 16 g 3   hydrOXYzine (ATARAX) 25 MG tablet Take 1 tablet (25 mg total) by mouth every 8 (eight) hours as needed for anxiety. 30 tablet 2   lithium carbonate (ESKALITH) 450 MG CR tablet Take 2 tablets (900 mg total) by mouth every morning. 180 tablet 3   metFORMIN (GLUCOPHAGE) 1000 MG tablet Take 1 tablet (1,000 mg total) by mouth 2 (two) times daily with a meal. 180 tablet 3   tamsulosin (FLOMAX) 0.4 MG CAPS capsule Take 1 capsule (0.4 mg total) by mouth daily. 90 capsule 1   No current facility-administered medications for this visit.     Musculoskeletal: Strength & Muscle Tone: within normal limits Gait & Station: normal Patient leans: N/A  Psychiatric Specialty Exam: Review of Systems  All other systems reviewed and are negative.   There were  no vitals taken for this visit.There is no height or weight on file to calculate BMI.  General Appearance: Casual and Fairly Groomed  Eye Contact:  Good  Speech:  Clear and Coherent  Volume:  Normal  Mood:  Euthymic  Affect:  Congruent  Thought Process:  Goal Directed  Orientation:  Full (Time, Place, and Person)  Thought Content: WDL   Suicidal Thoughts:  No  Homicidal Thoughts:  No  Memory:  Immediate;   Good Recent;   Good  Remote;   Fair  Judgement:  Fair  Insight:  Fair  Psychomotor Activity:  Normal  Concentration:  Concentration: Good and Attention Span: Good  Recall:  Good  Fund of Knowledge: Good  Language: Good  Akathisia:  No  Handed:  Right  AIMS (if indicated): not done  Assets:  Communication Skills Desire for Improvement Physical Health Resilience Social Support Talents/Skills  ADL's:  Intact  Cognition: WNL  Sleep:  Good   Screenings: GAD-7    Flowsheet Row Office Visit from 01/04/2022 in [facility] Office Visit from 10/04/2021 in [facility] Office Visit from 05/29/2021 in [facility] Office Visit from 02/08/2021 in [facility]  Total GAD-7 Score 0 0 0 3      PHQ2-9    Flowsheet Row [facility] Office Visit from 01/04/2022 in [facility] Video Visit from 10/26/2021 in [facility] Office Visit from 10/04/2021 in [facility] Video Visit from 06/02/2021 in [facility]  PHQ-2 Total Score 0 2 0 1 1  PHQ-9 Total Score -- 2 -- 1 --      Flowsheet Row Video Visit from 02/21/2022 [facility] Video Visit from 10/26/2021 in [facility] Video Visit from [facility]  C-SSRS RISK CATEGORY No Risk No Risk No Risk        Assessment and Plan: This patient is a 61 year old male with a history of bipolar disorder.  He denies current depressive or manic symptoms.  He does admit to a bout of drinking that lasted about 4 weeks but he is now stopped.  He declined referral for counseling for this.  He thinks he is doing well on his current regimen so he will continue lithium carbonate 450 mg daily for mood stabilization, Cymbalta 60 mg daily for depression and hydroxyzine 25 mg at bedtime as needed for sleep.  He will return to see me in 3 months.  He states he will be seeing his primary doctor next week and will get his lithium level checked at that time.  Collaboration of Care: Collaboration of Care: Primary Care Provider AEB notes are shared with PCP on the epic system  Patient/Guardian was advised Release of Information must be obtained prior to any record release in order to collaborate their care with an outside provider. Patient/Guardian was advised if they have not already done so to contact the registration department to sign all necessary forms in order for [place] to release information regarding their care.   Consent: Patient/Guardian gives verbal consent for treatment and assignment of benefits for services provided during this visit. Patient/Guardian expressed understanding and agreed to proceed.    Diannia Ruder, MD 06/01/2022, 10:30 AM

## 2022-06-15 ENCOUNTER — Ambulatory Visit: Admitting: Family Medicine

## 2022-07-19 ENCOUNTER — Ambulatory Visit: Admitting: Family Medicine

## 2022-07-19 ENCOUNTER — Encounter: Payer: Self-pay | Admitting: Family Medicine

## 2022-07-19 VITALS — BP 143/90 | HR 75 | Temp 98.0°F | Ht 72.0 in | Wt 209.0 lb

## 2022-07-19 DIAGNOSIS — E118 Type 2 diabetes mellitus with unspecified complications: Secondary | ICD-10-CM | POA: Diagnosis not present

## 2022-07-19 DIAGNOSIS — Z23 Encounter for immunization: Secondary | ICD-10-CM

## 2022-07-19 DIAGNOSIS — M791 Myalgia, unspecified site: Secondary | ICD-10-CM

## 2022-07-19 DIAGNOSIS — E785 Hyperlipidemia, unspecified: Secondary | ICD-10-CM

## 2022-07-19 DIAGNOSIS — E119 Type 2 diabetes mellitus without complications: Secondary | ICD-10-CM

## 2022-07-19 DIAGNOSIS — E1159 Type 2 diabetes mellitus with other circulatory complications: Secondary | ICD-10-CM

## 2022-07-19 DIAGNOSIS — E1169 Type 2 diabetes mellitus with other specified complication: Secondary | ICD-10-CM | POA: Diagnosis not present

## 2022-07-19 DIAGNOSIS — N400 Enlarged prostate without lower urinary tract symptoms: Secondary | ICD-10-CM

## 2022-07-19 DIAGNOSIS — F3162 Bipolar disorder, current episode mixed, moderate: Secondary | ICD-10-CM

## 2022-07-19 DIAGNOSIS — I152 Hypertension secondary to endocrine disorders: Secondary | ICD-10-CM

## 2022-07-19 DIAGNOSIS — T466X5A Adverse effect of antihyperlipidemic and antiarteriosclerotic drugs, initial encounter: Secondary | ICD-10-CM

## 2022-07-19 LAB — BAYER DCA HB A1C WAIVED: HB A1C (BAYER DCA - WAIVED): 7 % — ABNORMAL HIGH (ref 4.8–5.6)

## 2022-07-19 MED ORDER — TAMSULOSIN HCL 0.4 MG PO CAPS: 0.4000 mg | ORAL_CAPSULE | Freq: Every day | ORAL | 1 refills | Status: DC

## 2022-07-19 MED ORDER — LISINOPRIL 10 MG PO TABS: 10.0000 mg | ORAL_TABLET | Freq: Every day | ORAL | 3 refills | Status: DC

## 2022-07-19 NOTE — Progress Notes (Signed)
BP (!) 156/103   Pulse 75   Temp 98 F (36.7 C)   Ht 6' (1.829 m)   Wt 209 lb (94.8 kg)   SpO2 96%   BMI 28.35 kg/m    Subjective:   Patient ID: Adam Oneal, male    DOB: April 13, 1961, 61 y.o.   MRN: [id number]  HPI: Adam Oneal is a 61 y.o. male presenting on 07/19/2022 for Medical Management of Chronic Issues, Hyperlipidemia, Hypertension, and Diabetes   HPI Type 2 diabetes mellitus Patient comes in today for recheck of his diabetes. Patient has been currently taking metformin and Jardiance, A1c is up a little bit but he has been drinking a lot more alcohol.. Patient is not currently on an ACE inhibitor/ARB. Patient has seen an ophthalmologist this year. Patient denies any issues with their feet. The symptom started onset as an adult hypertension and hyperlipidemia ARE RELATED TO DM   Hypertension Patient is currently on no medicine currently, and their blood pressure today is 156/103. Patient denies any lightheadedness or dizziness. Patient denies headaches, blurred vision, chest pains, shortness of breath, or weakness. Denies any side effects from medication and is content with current medication.   Hyperlipidemia Patient is coming in for recheck of his hyperlipidemia. The patient is currently taking none currently, was intolerant of statins and had myalgias. They deny any issues with myalgias or history of liver damage from it. They deny any focal numbness or weakness or chest pain.   Bipolar and depression and anxiety Patient sees psychiatry for bipolar and depression and anxiety.  Relevant past medical, surgical, family and social history reviewed and updated as indicated. Interim medical history since our last visit reviewed. Allergies and medications reviewed and updated.  Review of Systems  Constitutional:  Negative for chills and fever.  Eyes:  Negative for visual disturbance.  Respiratory:  Negative for shortness of breath and wheezing.   Cardiovascular:   Negative for chest pain and leg swelling.  Musculoskeletal:  Negative for back pain and gait problem.  Skin:  Negative for rash.  Neurological:  Negative for dizziness, weakness and light-headedness.  All other systems reviewed and are negative.   Per HPI unless specifically indicated above   Allergies as of 07/19/2022       Reactions   Lipitor [atorvastatin] Shortness Of Breath        Medication List        Accurate as of July 19, 2022  2:05 PM. If you have any questions, ask your nurse or doctor.          cyclobenzaprine 10 MG tablet Commonly known as: FLEXERIL Take 1 tablet (10 mg total) by mouth 3 (three) times daily as needed for muscle spasms.   DULoxetine 60 MG capsule Commonly known as: Cymbalta Take 1 capsule (60 mg total) by mouth daily.   empagliflozin 25 MG Tabs tablet Commonly known as: Jardiance Take 1 tablet (25 mg total) by mouth daily before breakfast.   fluticasone 50 MCG/ACT nasal spray Commonly known as: FLONASE Place 1 spray into both nostrils daily.   hydrOXYzine 25 MG tablet Commonly known as: ATARAX Take 1 tablet (25 mg total) by mouth every 8 (eight) hours as needed for anxiety.   lisinopril 10 MG tablet Commonly known as: ZESTRIL Take 1 tablet (10 mg total) by mouth daily. Started by: Worthy Rancher, MD   lithium carbonate 450 MG ER tablet Commonly known as: ESKALITH Take 2 tablets (900 mg total) by mouth every  morning.   metFORMIN 1000 MG tablet Commonly known as: GLUCOPHAGE Take 1 tablet (1,000 mg total) by mouth 2 (two) times daily with a meal.   tamsulosin 0.4 MG Caps capsule Commonly known as: FLOMAX Take 1 capsule (0.4 mg total) by mouth daily.         Objective:   BP (!) 156/103   Pulse 75   Temp 98 F (36.7 C)   Ht 6' (1.829 m)   Wt 209 lb (94.8 kg)   SpO2 96%   BMI 28.35 kg/m   Wt Readings from Last 3 Encounters:  07/19/22 209 lb (94.8 kg)  01/04/22 210 lb (95.3 kg)  10/04/21 213 lb (96.6  kg)    Physical Exam Vitals and nursing note reviewed.  Constitutional:      General: He is not in acute distress.    Appearance: He is well-developed. He is not diaphoretic.  Eyes:     General: No scleral icterus.    Conjunctiva/sclera: Conjunctivae normal.  Neck:     Thyroid: No thyromegaly.  Cardiovascular:     Rate and Rhythm: Normal rate and regular rhythm.     Heart sounds: Normal heart sounds. No murmur heard. Pulmonary:     Effort: Pulmonary effort is normal. No respiratory distress.     Breath sounds: Normal breath sounds. No wheezing.  Musculoskeletal:        General: Normal range of motion.     Cervical back: Neck supple.  Lymphadenopathy:     Cervical: No cervical adenopathy.  Skin:    General: Skin is warm and dry.     Findings: No rash.  Neurological:     Mental Status: He is alert and oriented to person, place, and time.     Coordination: Coordination normal.  Psychiatric:        Behavior: Behavior normal.       Assessment & Plan:   Problem List Items Addressed This Visit       Cardiovascular and Mediastinum   Hypertension associated with diabetes ([place])   Relevant Medications   lisinopril (ZESTRIL) 10 MG tablet   Other Relevant Orders   CBC with Differential/Platelet   CMP14+EGFR   Lipid panel   Bayer DCA Hb A1c Waived   BMP8+EGFR     Endocrine   Type 2 diabetes mellitus without complication, without long-term current use of insulin ([facility])   Relevant Medications   lisinopril (ZESTRIL) 10 MG tablet   Hyperlipidemia associated with type 2 diabetes mellitus ([facility])   Relevant Medications   lisinopril (ZESTRIL) 10 MG tablet   Other Relevant Orders   CBC with Differential/Platelet   CMP14+EGFR   Lipid panel   Bayer DCA Hb A1c Waived     Other   Bipolar 1 disorder, mixed, moderate ([facility])   Relevant Orders   Lithium level   Myalgia due to statin   Other Visit Diagnoses     Controlled type 2 diabetes mellitus with complication, without  long-term current use of insulin ([facility])    -  Primary   Relevant Medications   lisinopril (ZESTRIL) 10 MG tablet   Other Relevant Orders   CBC with Differential/Platelet   CMP14+EGFR   Lipid panel   Bayer DCA Hb A1c Waived   BMP8+EGFR   Benign prostatic hyperplasia without lower urinary tract symptoms       Relevant Medications   tamsulosin (FLOMAX) 0.4 MG CAPS capsule       A1c 7.0, he has been drinking a lot more  and will back off on that.  Blood pressure elevated today, he thinks also from the alcohol.  He will keep a close eye on it.  We will start a low-dose lisinopril and have him come back in 2 or 3 weeks and recheck a BMP Follow up plan: Return in about 3 months (around 10/18/2022), or if symptoms worsen or fail to improve, for Diabetes recheck.  Counseling provided for all of the vaccine components Orders Placed This Encounter  Procedures   CBC with Differential/Platelet   CMP14+EGFR   Lipid panel   Bayer DCA Hb A1c Waived   Lithium level   BMP8+EGFR    Caryl Pina, MD [facility] 07/19/2022, 2:05 PM

## 2022-07-20 LAB — CBC WITH DIFFERENTIAL/PLATELET
Basophils Absolute: 0 10*3/uL (ref 0.0–0.2)
Basos: 0 %
EOS (ABSOLUTE): 0.2 10*3/uL (ref 0.0–0.4)
Eos: 2 %
Hematocrit: 48.4 % (ref 37.5–51.0)
Hemoglobin: 16 g/dL (ref 13.0–17.7)
Immature Grans (Abs): 0 10*3/uL (ref 0.0–0.1)
Immature Granulocytes: 0 %
Lymphocytes Absolute: 3.1 10*3/uL (ref 0.7–3.1)
Lymphs: 29 %
MCH: 30 pg (ref 26.6–33.0)
MCHC: 33.1 g/dL (ref 31.5–35.7)
MCV: 91 fL (ref 79–97)
Monocytes Absolute: 1.6 10*3/uL — ABNORMAL HIGH (ref 0.1–0.9)
Monocytes: 15 %
Neutrophils Absolute: 5.8 10*3/uL (ref 1.4–7.0)
Neutrophils: 54 %
Platelets: 416 10*3/uL (ref 150–450)
RBC: 5.34 x10E6/uL (ref 4.14–5.80)
RDW: 12.9 % (ref 11.6–15.4)
WBC: 10.8 10*3/uL (ref 3.4–10.8)

## 2022-07-20 LAB — CMP14+EGFR
ALT: 18 IU/L (ref 0–44)
AST: 16 IU/L (ref 0–40)
Albumin/Globulin Ratio: 1.6 (ref 1.2–2.2)
Albumin: 4.1 g/dL (ref 3.9–4.9)
Alkaline Phosphatase: 129 IU/L — ABNORMAL HIGH (ref 44–121)
BUN/Creatinine Ratio: 19 (ref 10–24)
BUN: 16 mg/dL (ref 8–27)
Bilirubin Total: 0.3 mg/dL (ref 0.0–1.2)
CO2: 22 mmol/L (ref 20–29)
Calcium: 9.4 mg/dL (ref 8.6–10.2)
Chloride: 106 mmol/L (ref 96–106)
Creatinine, Ser: 0.83 mg/dL (ref 0.76–1.27)
Globulin, Total: 2.5 g/dL (ref 1.5–4.5)
Glucose: 143 mg/dL — ABNORMAL HIGH (ref 70–99)
Potassium: 4.7 mmol/L (ref 3.5–5.2)
Sodium: 141 mmol/L (ref 134–144)
Total Protein: 6.6 g/dL (ref 6.0–8.5)
eGFR: 98 mL/min/{1.73_m2} (ref >59)

## 2022-07-20 LAB — LITHIUM LEVEL: Lithium Lvl: 0.4 mmol/L — ABNORMAL LOW (ref 0.5–1.2)

## 2022-07-20 LAB — LIPID PANEL
Chol/HDL Ratio: 2.6 ratio (ref 0.0–5.0)
Cholesterol, Total: 121 mg/dL (ref 100–199)
HDL: 46 mg/dL (ref >39)
LDL Chol Calc (NIH): 51 mg/dL (ref 0–99)
Triglycerides: 141 mg/dL (ref 0–149)
VLDL Cholesterol Cal: 24 mg/dL (ref 5–40)

## 2022-07-24 NOTE — Progress Notes (Signed)
Reviewed, continue lithium as prescribed

## 2022-08-08 ENCOUNTER — Other Ambulatory Visit

## 2022-08-08 DIAGNOSIS — E1159 Type 2 diabetes mellitus with other circulatory complications: Secondary | ICD-10-CM

## 2022-08-08 DIAGNOSIS — E118 Type 2 diabetes mellitus with unspecified complications: Secondary | ICD-10-CM

## 2022-08-08 DIAGNOSIS — I152 Hypertension secondary to endocrine disorders: Secondary | ICD-10-CM

## 2022-08-09 LAB — BMP8+EGFR
BUN/Creatinine Ratio: 21 (ref 10–24)
BUN: 18 mg/dL (ref 8–27)
CO2: 20 mmol/L (ref 20–29)
Calcium: 9.8 mg/dL (ref 8.6–10.2)
Chloride: 105 mmol/L (ref 96–106)
Creatinine, Ser: 0.87 mg/dL (ref 0.76–1.27)
Glucose: 168 mg/dL — ABNORMAL HIGH (ref 70–99)
Potassium: 5.2 mmol/L (ref 3.5–5.2)
Sodium: 140 mmol/L (ref 134–144)
eGFR: 97 mL/min/{1.73_m2} (ref >59)

## 2022-09-03 ENCOUNTER — Encounter (HOSPITAL_COMMUNITY): Payer: Self-pay | Admitting: Psychiatry

## 2022-09-03 ENCOUNTER — Telehealth (HOSPITAL_COMMUNITY): Admitting: Psychiatry

## 2022-09-03 DIAGNOSIS — F3162 Bipolar disorder, current episode mixed, moderate: Secondary | ICD-10-CM | POA: Diagnosis not present

## 2022-09-03 MED ORDER — LITHIUM CARBONATE ER 450 MG PO TBCR: 900.0000 mg | EXTENDED_RELEASE_TABLET | ORAL | 3 refills | Status: DC

## 2022-09-03 MED ORDER — DULOXETINE HCL 60 MG PO CPEP: 60.0000 mg | ORAL_CAPSULE | Freq: Every day | ORAL | 2 refills | Status: DC

## 2022-09-03 NOTE — Progress Notes (Signed)
Virtual Visit via Video Note  I connected with Adam Oneal on 09/03/22 at 11:20 AM EST by a video enabled telemedicine application and verified that I am speaking with the correct person using two identifiers.  Location: Patient: home Provider: office   I discussed the limitations of evaluation and management by telemedicine and the availability of in person appointments. The patient expressed understanding and agreed to proceed.      I discussed the assessment and treatment plan with the patient. The patient was provided an opportunity to ask questions and all were answered. The patient agreed with the plan and demonstrated an understanding of the instructions.   The patient was advised to call back or seek an in-person evaluation if the symptoms worsen or if the condition fails to improve as anticipated.  I provided 20 minutes of non-face-to-face time during this encounter.   Levonne Spiller, MD  [facility] MD/PA/NP OP Progress Note  09/03/2022 11:41 AM Adam Oneal  MRN:  [id number]  Chief Complaint:  Chief Complaint  Patient presents with   Depression   Manic Behavior   Follow-up   HPI: This patient is a 62 year old white male who lives in [place].  He has 2 roommates.  He was working for the [employer] but retired 2 years ago.  The patient returns for follow-up after 3 months.  He states overall he is doing well.  He is drinking much less-1-2 beers twice a week by his report.  His A1c was higher last time and it has probably because of the alcohol.  It was 7.  He states that he is trying to cut down to just once a month.  He states his mood has been good and he has been doing his walking.  He denies significant depression anxiety racing thoughts difficulty sleeping.  He has been compliant with medicines.  His last lithium level was 0.4 and basic metabolic panel was normal. Visit Diagnosis:    ICD-10-CM   1. Bipolar 1 disorder, mixed, moderate ([place])  F31.62       Past  Psychiatric History: Long-term outpatient treatment for bipolar disorder  Past Medical History:  Past Medical History:  Diagnosis Date   Acid reflux    Anxiety 01/1998   Cataract 02/2004   Depression 01/1998   Diabetes mellitus without complication ([place]) 40/9811   Diabetes mellitus, type II ([place])    Hyperlipidemia 11/2006   Hypertension    Hypertension    History reviewed. No pertinent surgical history.  Family Psychiatric History: See below  Family History:  Family History  Problem Relation Age of Onset   Cancer Mother 57       lung   Heart disease Father    Hyperlipidemia Father    Diabetes Brother    Leukemia Brother    Diabetes Paternal Grandmother    Heart disease Paternal Grandmother    Alcohol abuse Paternal Grandfather    Diabetes Brother    Depression Maternal Aunt     Social History:  Social History   Socioeconomic History   Marital status: Widowed    Spouse name: Not on file   Number of children: Not on file   Years of education: Not on file   Highest education level: Not on file  Occupational History   Not on file  Tobacco Use   Smoking status: Former    Packs/day: 1.00    Years: 12.00    Total pack years: 12.00    Types: Cigarettes  Start date: 01/05/1978    Quit date: 12/28/1989    Years since quitting: 32.7   Smokeless tobacco: Former  Scientific laboratory technician Use: Never used  Substance and Sexual Activity   Alcohol use: No    Alcohol/week: 0.0 standard drinks of alcohol    Comment: 03-26-16 per pt no   Drug use: No    Comment: 03-26-16 per pt currently no but use to   Sexual activity: Never  Other Topics Concern   Not on file  Social History Narrative   Not on file   Social Determinants of Health   Financial Resource Strain: Not on file  Food Insecurity: Not on file  Transportation Needs: Not on file  Physical Activity: Not on file  Stress: Not on file  Social Connections: Not on file    Allergies:  Allergies  Allergen Reactions    Lipitor [Atorvastatin] Shortness Of Breath    Metabolic Disorder Labs: Lab Results  Component Value Date   HGBA1C 7.0 (H) 07/19/2022   No results found for: "PROLACTIN" Lab Results  Component Value Date   CHOL 121 07/19/2022   TRIG 141 07/19/2022   HDL 46 07/19/2022   CHOLHDL 2.6 07/19/2022   LDLCALC 51 07/19/2022   LDLCALC 98 10/04/2021   Lab Results  Component Value Date   TSH 1.580 11/07/2020   TSH 0.96 02/09/2019    Therapeutic Level Labs: Lab Results  Component Value Date   LITHIUM 0.4 (L) 07/19/2022   LITHIUM 0.7 10/04/2021   No results found for: "VALPROATE" No results found for: "CBMZ"  Current Medications: Current Outpatient Medications  Medication Sig Dispense Refill   cyclobenzaprine (FLEXERIL) 10 MG tablet Take 1 tablet (10 mg total) by mouth 3 (three) times daily as needed for muscle spasms. 270 tablet 3   DULoxetine (CYMBALTA) 60 MG capsule Take 1 capsule (60 mg total) by mouth daily. 90 capsule 2   empagliflozin (JARDIANCE) 25 MG TABS tablet Take 1 tablet (25 mg total) by mouth daily before breakfast. 90 tablet 3   fluticasone (FLONASE) 50 MCG/ACT nasal spray Place 1 spray into both nostrils daily. 16 g 3   hydrOXYzine (ATARAX) 25 MG tablet Take 1 tablet (25 mg total) by mouth every 8 (eight) hours as needed for anxiety. 30 tablet 2   lisinopril (ZESTRIL) 10 MG tablet Take 1 tablet (10 mg total) by mouth daily. 90 tablet 3   lithium carbonate (ESKALITH) 450 MG ER tablet Take 2 tablets (900 mg total) by mouth every morning. 180 tablet 3   metFORMIN (GLUCOPHAGE) 1000 MG tablet Take 1 tablet (1,000 mg total) by mouth 2 (two) times daily with a meal. 180 tablet 3   tamsulosin (FLOMAX) 0.4 MG CAPS capsule Take 1 capsule (0.4 mg total) by mouth daily. 90 capsule 1   No current facility-administered medications for this visit.     Musculoskeletal: Strength & Muscle Tone: within normal limits Gait & Station: normal Patient leans: N/A  Psychiatric  Specialty Exam: Review of Systems  All other systems reviewed and are negative.   There were no vitals taken for this visit.There is no height or weight on file to calculate BMI.  General Appearance: Casual and Fairly Groomed  Eye Contact:  Good  Speech:  Clear and Coherent  Volume:  Normal  Mood:  Euthymic  Affect:  Congruent  Thought Process:  Goal Directed  Orientation:  Full (Time, Place, and Person)  Thought Content: WDL   Suicidal Thoughts:  No  Homicidal  Thoughts:  No  Memory:  Immediate;   Good Recent;   Good Remote;   NA  Judgement:  Fair  Insight:  Fair  Psychomotor Activity:  Normal  Concentration:  Concentration: Good and Attention Span: Good  Recall:  Good  Fund of Knowledge: Good  Language: Good  Akathisia:  No  Handed:  Right  AIMS (if indicated): not done  Assets:  Communication Skills Desire for Improvement Resilience Social Support Talents/Skills  ADL's:  Intact  Cognition: WNL  Sleep:  Good   Screenings: GAD-7    Flowsheet Row Office Visit from 07/19/2022 in [place] Office Visit from 01/04/2022 in [place] Office Visit from 10/04/2021 in [place] Office Visit from 05/29/2021 in [place] Office Visit from 02/08/2021 in [place]  Total GAD-7 Score 0 0 0 0 3      PHQ2-9    [place] Visit from 07/19/2022 in [place] Video Visit from 02/21/2022 in [place] [facility] [place] Visit from 01/04/2022 in [place] Video Visit from 10/26/2021 in [place] at [place] Visit from 10/04/2021 in [facility]  PHQ-2 Total Score 0 0 2 0 1  PHQ-9 Total Score -- -- 2 -- 1      Flowsheet Row  Video Visit from 02/21/2022 in [place] at [place] Video Visit from 10/26/2021 in [place] at [place] Video Visit from 06/02/2021 in [place] at [place] No Risk No Risk No Risk        Assessment and Plan: This patient is a 62 year old male with a history of bipolar disorder.  Again I urged him to minimize drinking as much as possible given the medications that he takes.  He seems to be doing well so we will continue lithium carbonate 450 mg daily for mood stabilization, Cymbalta 60 mg daily for depression and hydroxyzine 25 mg at bedtime as needed for sleep.  He will return to see me in 3 months  Collaboration of Care: Collaboration of Care: Primary Care Provider AEB notes are shared with PCP on the epic system  Patient/Guardian was advised Release of Information must be obtained prior to any record release in order to collaborate their care with an outside provider. Patient/Guardian was advised if they have not already done so to contact the registration department to sign all necessary forms in order for [place] to release information regarding their care.   Consent: Patient/Guardian gives verbal consent for treatment and assignment of benefits for services provided during this visit. Patient/Guardian expressed understanding and agreed to proceed.    Levonne Spiller, MD 09/03/2022, 11:41 AM

## 2022-10-01 ENCOUNTER — Ambulatory Visit: Admitting: Family Medicine

## 2022-10-01 ENCOUNTER — Encounter: Payer: Self-pay | Admitting: Family Medicine

## 2022-10-01 DIAGNOSIS — E118 Type 2 diabetes mellitus with unspecified complications: Secondary | ICD-10-CM

## 2022-10-01 DIAGNOSIS — E785 Hyperlipidemia, unspecified: Secondary | ICD-10-CM

## 2022-10-01 DIAGNOSIS — I152 Hypertension secondary to endocrine disorders: Secondary | ICD-10-CM

## 2022-10-01 DIAGNOSIS — E119 Type 2 diabetes mellitus without complications: Secondary | ICD-10-CM

## 2022-10-01 DIAGNOSIS — E1169 Type 2 diabetes mellitus with other specified complication: Secondary | ICD-10-CM | POA: Diagnosis not present

## 2022-10-01 DIAGNOSIS — E1159 Type 2 diabetes mellitus with other circulatory complications: Secondary | ICD-10-CM

## 2022-10-01 LAB — BAYER DCA HB A1C WAIVED: HB A1C (BAYER DCA - WAIVED): 6.8 % — ABNORMAL HIGH (ref 4.8–5.6)

## 2022-10-01 NOTE — Progress Notes (Signed)
There were no vitals taken for this visit.  Patient refused vitals today.  Subjective:   Patient ID: Adam Oneal, male    DOB: 06/04/1961, 62 y.o.   MRN: [id number]  HPI: Adam Oneal is a 62 y.o. male presenting on 10/01/2022 for Medical Management of Chronic Issues and Diabetes   HPI Type 2 diabetes mellitus Patient comes in today for recheck of his diabetes. Patient has been currently taking metformin and Jardiance. Patient is currently on an ACE inhibitor/ARB. Patient has not seen an ophthalmologist this year. Patient denies any issues with their feet. The symptom started onset as an adult hypertension and hyperlipidemia ARE RELATED TO DM   Hypertension Patient is currently on lisinopril, and their blood pressure today is unknown because patient refused today.. Patient denies any lightheadedness or dizziness. Patient denies headaches, blurred vision, chest pains, shortness of breath, or weakness. Denies any side effects from medication and is content with current medication.   Hyperlipidemia Patient is coming in for recheck of his hyperlipidemia. The patient is currently taking no medicine currently. They deny any issues with myalgias or history of liver damage from it. They deny any focal numbness or weakness or chest pain.   Relevant past medical, surgical, family and social history reviewed and updated as indicated. Interim medical history since our last visit reviewed. Allergies and medications reviewed and updated.  Review of Systems  Constitutional:  Negative for chills and fever.  Eyes:  Negative for visual disturbance.  Respiratory:  Negative for shortness of breath and wheezing.   Cardiovascular:  Negative for chest pain and leg swelling.  Musculoskeletal:  Negative for back pain and gait problem.  Skin:  Negative for rash.  Neurological:  Negative for dizziness, weakness and light-headedness.  Psychiatric/Behavioral:  Positive for agitation and behavioral problems.    All other systems reviewed and are negative.   Per HPI unless specifically indicated above   Allergies as of 10/01/2022       Reactions   Lipitor [atorvastatin] Shortness Of Breath        Medication List        Accurate as of October 01, 2022  3:13 PM. If you have any questions, ask your nurse or doctor.          cyclobenzaprine 10 MG tablet Commonly known as: FLEXERIL Take 1 tablet (10 mg total) by mouth 3 (three) times daily as needed for muscle spasms.   DULoxetine 60 MG capsule Commonly known as: Cymbalta Take 1 capsule (60 mg total) by mouth daily.   empagliflozin 25 MG Tabs tablet Commonly known as: Jardiance Take 1 tablet (25 mg total) by mouth daily before breakfast.   fluticasone 50 MCG/ACT nasal spray Commonly known as: FLONASE Place 1 spray into both nostrils daily.   hydrOXYzine 25 MG tablet Commonly known as: ATARAX Take 1 tablet (25 mg total) by mouth every 8 (eight) hours as needed for anxiety.   lisinopril 10 MG tablet Commonly known as: ZESTRIL Take 1 tablet (10 mg total) by mouth daily.   lithium carbonate 450 MG ER tablet Commonly known as: ESKALITH Take 2 tablets (900 mg total) by mouth every morning.   metFORMIN 1000 MG tablet Commonly known as: GLUCOPHAGE Take 1 tablet (1,000 mg total) by mouth 2 (two) times daily with a meal.   tamsulosin 0.4 MG Caps capsule Commonly known as: FLOMAX Take 1 capsule (0.4 mg total) by mouth daily.         Objective:  There were no vitals taken for this visit.  Wt Readings from Last 3 Encounters:  07/19/22 209 lb (94.8 kg)  01/04/22 210 lb (95.3 kg)  10/04/21 213 lb (96.6 kg)    Physical Exam Vitals and nursing note reviewed.  Constitutional:      General: He is not in acute distress.    Appearance: He is well-developed. He is not diaphoretic.  Eyes:     General: No scleral icterus.    Conjunctiva/sclera: Conjunctivae normal.  Neck:     Thyroid: No thyromegaly.  Cardiovascular:      Rate and Rhythm: Normal rate and regular rhythm.     Heart sounds: Normal heart sounds. No murmur heard. Pulmonary:     Effort: Pulmonary effort is normal. No respiratory distress.     Breath sounds: Normal breath sounds. No wheezing.  Musculoskeletal:        General: Normal range of motion.     Cervical back: Neck supple.  Lymphadenopathy:     Cervical: No cervical adenopathy.  Skin:    General: Skin is warm and dry.     Findings: No rash.  Neurological:     Mental Status: He is alert and oriented to person, place, and time.     Coordination: Coordination normal.  Psychiatric:        Behavior: Behavior normal.       Assessment & Plan:   Problem List Items Addressed This Visit       Cardiovascular and Mediastinum   Hypertension associated with diabetes ([place])   Relevant Orders   Bayer DCA Hb A1c Waived     Endocrine   Type 2 diabetes mellitus without complication, without long-term current use of insulin (HCC)   Hyperlipidemia associated with type 2 diabetes mellitus ([place])   Other Visit Diagnoses     Controlled type 2 diabetes mellitus with complication, without long-term current use of insulin ([place])    -  Primary   Relevant Orders   Bayer DCA Hb A1c Waived       A1c 6.8. No change in medication.  Patient refused his blood pressure check today so we will monitor in the future Follow up plan: Return if symptoms worsen or fail to improve.  Counseling provided for all of the vaccine components Orders Placed This Encounter  Procedures   Bayer Guttenberg Hb A1c Attalla Earle Burson, MD [facility] 10/01/2022, 3:13 PM

## 2022-11-01 ENCOUNTER — Other Ambulatory Visit: Payer: Self-pay | Admitting: Family Medicine

## 2022-11-01 DIAGNOSIS — E119 Type 2 diabetes mellitus without complications: Secondary | ICD-10-CM

## 2022-11-01 DIAGNOSIS — E118 Type 2 diabetes mellitus with unspecified complications: Secondary | ICD-10-CM

## 2022-12-03 ENCOUNTER — Telehealth (HOSPITAL_COMMUNITY): Admitting: Psychiatry

## 2022-12-03 ENCOUNTER — Encounter (HOSPITAL_COMMUNITY): Payer: Self-pay | Admitting: Psychiatry

## 2022-12-03 DIAGNOSIS — F3162 Bipolar disorder, current episode mixed, moderate: Secondary | ICD-10-CM | POA: Diagnosis not present

## 2022-12-03 MED ORDER — HYDROXYZINE HCL 25 MG PO TABS: 25.0000 mg | ORAL_TABLET | Freq: Three times a day (TID) | ORAL | 2 refills | Status: DC | PRN

## 2022-12-03 MED ORDER — DULOXETINE HCL 60 MG PO CPEP: 60.0000 mg | ORAL_CAPSULE | Freq: Every day | ORAL | 2 refills | Status: DC

## 2022-12-03 MED ORDER — LITHIUM CARBONATE ER 450 MG PO TBCR: 900.0000 mg | EXTENDED_RELEASE_TABLET | ORAL | 3 refills | Status: DC

## 2022-12-03 NOTE — Progress Notes (Signed)
Virtual Visit via Video Note  I connected with Adam Oneal on 12/03/22 at 11:20 AM EDT by a video enabled telemedicine application and verified that I am speaking with the correct person using two identifiers.  Location: Patient: home Provider: office   I discussed the limitations of evaluation and management by telemedicine and the availability of in person appointments. The patient expressed understanding and agreed to proceed.     I discussed the assessment and treatment plan with the patient. The patient was provided an opportunity to ask questions and all were answered. The patient agreed with the plan and demonstrated an understanding of the instructions.   The patient was advised to call back or seek an in-person evaluation if the symptoms worsen or if the condition fails to improve as anticipated.  I provided 15 minutes of non-face-to-face time during this encounter.   Diannia Ruder, MD  [facility] MD/PA/NP OP Progress Note  12/03/2022 11:34 AM Adam Oneal  MRN:  [id number]  Chief Complaint:  Chief Complaint  Patient presents with   Anxiety   Depression   Manic Behavior   Follow-up   HPI: This patient is a 62 year old widowed white male who lives  in [place].  He has 2 roommates.  He was working for the [employer] but retired about 3 years ago.  The patient returns for follow-up after about 3 months.  Overall he states that he is doing well.  His mood is good and he denies depression and/or manic symptoms.  He denies racing thoughts or difficulty sleeping.  He denies significant anxiety.  He has been compliant with his medications.  He is planning a trip to [place] in July to visit his dad and brother.  He denies thoughts of self-harm or suicide Visit Diagnosis:    ICD-10-CM   1. Bipolar 1 disorder, mixed, moderate (HCC)  F31.62       Past Psychiatric History: Long-term outpatient treatment for bipolar disorder  Past Medical History:  Past Medical History:   Diagnosis Date   Acid reflux    Anxiety 01/1998   Cataract 02/2004   Depression 01/1998   Diabetes mellitus without complication (HCC) 12/1999   Diabetes mellitus, type II (HCC)    Hyperlipidemia 11/2006   Hypertension    Hypertension    History reviewed. No pertinent surgical history.  Family Psychiatric History: See below  Family History:  Family History  Problem Relation Age of Onset   Cancer Mother 46       lung   Heart disease Father    Hyperlipidemia Father    Diabetes Brother    Leukemia Brother    Diabetes Paternal Grandmother    Heart disease Paternal Grandmother    Alcohol abuse Paternal Grandfather    Diabetes Brother    Depression Maternal Aunt     Social History:  Social History   Socioeconomic History   Marital status: Widowed    Spouse name: Not on file   Number of children: Not on file   Years of education: Not on file   Highest education level: Not on file  Occupational History   Not on file  Tobacco Use   Smoking status: Former    Packs/day: 1.00    Years: 12.00    Additional pack years: 0.00    Total pack years: 12.00    Types: Cigarettes    Start date: 01/05/1978    Quit date: 12/28/1989    Years since quitting: 32.9   Smokeless tobacco:  Former  Building services engineer Use: Never used  Substance and Sexual Activity   Alcohol use: No    Alcohol/week: 0.0 standard drinks of alcohol    Comment: 03-26-16 per pt no   Drug use: No    Comment: 03-26-16 per pt currently no but use to   Sexual activity: Never  Other Topics Concern   Not on file  Social History Narrative   Not on file   Social Determinants of Health   Financial Resource Strain: Not on file  Food Insecurity: Not on file  Transportation Needs: Not on file  Physical Activity: Not on file  Stress: Not on file  Social Connections: Not on file    Allergies:  Allergies  Allergen Reactions   Lipitor [Atorvastatin] Shortness Of Breath    Metabolic Disorder Labs: Lab Results   Component Value Date   HGBA1C 6.8 (H) 10/01/2022   No results found for: "PROLACTIN" Lab Results  Component Value Date   CHOL 121 07/19/2022   TRIG 141 07/19/2022   HDL 46 07/19/2022   CHOLHDL 2.6 07/19/2022   LDLCALC 51 07/19/2022   LDLCALC 98 10/04/2021   Lab Results  Component Value Date   TSH 1.580 11/07/2020   TSH 0.96 02/09/2019    Therapeutic Level Labs: Lab Results  Component Value Date   LITHIUM 0.4 (L) 07/19/2022   LITHIUM 0.7 10/04/2021   No results found for: "VALPROATE" No results found for: "CBMZ"  Current Medications: Current Outpatient Medications  Medication Sig Dispense Refill   cyclobenzaprine (FLEXERIL) 10 MG tablet Take 1 tablet (10 mg total) by mouth 3 (three) times daily as needed for muscle spasms. 270 tablet 3   DULoxetine (CYMBALTA) 60 MG capsule Take 1 capsule (60 mg total) by mouth daily. 90 capsule 2   empagliflozin (JARDIANCE) 25 MG TABS tablet Take 1 tablet (25 mg total) by mouth daily before breakfast. 90 tablet 3   fluticasone (FLONASE) 50 MCG/ACT nasal spray USE 1 SPRAY IN EACH NOSTRIL DAILY 48 g 1   hydrOXYzine (ATARAX) 25 MG tablet Take 1 tablet (25 mg total) by mouth every 8 (eight) hours as needed for anxiety. 30 tablet 2   lisinopril (ZESTRIL) 10 MG tablet Take 1 tablet (10 mg total) by mouth daily. 90 tablet 3   lithium carbonate (ESKALITH) 450 MG ER tablet Take 2 tablets (900 mg total) by mouth every morning. 180 tablet 3   metFORMIN (GLUCOPHAGE) 1000 MG tablet TAKE 1 TABLET TWICE A DAY WITH MEALS 180 tablet 0   tamsulosin (FLOMAX) 0.4 MG CAPS capsule Take 1 capsule (0.4 mg total) by mouth daily. 90 capsule 1   No current facility-administered medications for this visit.     Musculoskeletal: Strength & Muscle Tone: within normal limits Gait & Station: normal Patient leans: N/A  Psychiatric Specialty Exam: Review of Systems  All other systems reviewed and are negative.   There were no vitals taken for this visit.There is  no height or weight on file to calculate BMI.  General Appearance: Casual and Fairly Groomed  Eye Contact:  Good  Speech:  Clear and Coherent  Volume:  Normal  Mood:  Euthymic  Affect:  Congruent  Thought Process:  Goal Directed  Orientation:  Full (Time, Place, and Person)  Thought Content: WDL   Suicidal Thoughts:  No  Homicidal Thoughts:  No  Memory:  Immediate;   Good Recent;   Good Remote;   NA  Judgement:  Fair  Insight:  Fair  Psychomotor Activity:  Normal  Concentration:  Concentration: Good and Attention Span: Good  Recall:  Good  Fund of Knowledge: Good  Language: Good  Akathisia:  No  Handed:  Right  AIMS (if indicated): not done  Assets:  Communication Skills Desire for Improvement Physical Health Resilience Social Support  ADL's:  Intact  Cognition: WNL  Sleep:  Good   Screenings: GAD-7    Flowsheet Row Office Visit from 07/19/2022 in [facility] Office Visit from 01/04/2022 in [facility] Office Visit from 10/04/2021 in [facility] Office Visit from 05/29/2021 in [facility] Office Visit from 02/08/2021 in [facility]  Total GAD-7 Score 0 0 0 0 3      PHQ2-9    Flowsheet Row Office Visit from 07/19/2022 in [facility] Video Visit from 02/21/2022 in [facility] [place] Office Visit from 01/04/2022 in [facility] Video Visit from 10/26/2021 in [facility] [place] Office Visit from 10/04/2021 in [facility]  PHQ-2 Total Score 0 0 2 0 1  PHQ-9 Total Score -- -- 2 -- 1      Flowsheet Row Video Visit from 02/21/2022 in [facility] [place] Video Visit from 10/26/2021 in [facility] [place] Video Visit from 06/02/2021 in [facility] [place]  C-SSRS RISK CATEGORY No Risk No Risk No Risk        Assessment and Plan: This patient is a 62 year old male with a history of bipolar disorder.  He is doing well on his current regimen.  He will continue lithium carbonate 900 mg daily for mood stabilization, Cymbalta 60 mg daily for depression and hydroxyzine 25 mg at bedtime as needed for sleep.  He will return to see me in 3 months  Collaboration of Care: Collaboration of Care: Primary Care Provider AEB notes are shared with PCP on the epic system  Patient/Guardian was advised Release of Information must be obtained prior to any record release in order to collaborate their care with an outside provider. Patient/Guardian was advised if they have not already done so to contact the registration department to sign all necessary forms in order for [place] to release information regarding their care.   Consent: Patient/Guardian gives verbal consent for treatment and assignment of benefits for services provided during this visit. Patient/Guardian expressed understanding and agreed to proceed.    Diannia Ruder, MD 12/03/2022, 11:34 AM

## 2023-01-03 ENCOUNTER — Encounter: Payer: Self-pay | Admitting: Family Medicine

## 2023-01-03 ENCOUNTER — Ambulatory Visit: Admitting: Family Medicine

## 2023-01-03 VITALS — BP 117/73 | HR 76 | Temp 98.7°F | Ht 72.0 in | Wt 207.8 lb

## 2023-01-03 DIAGNOSIS — I152 Hypertension secondary to endocrine disorders: Secondary | ICD-10-CM

## 2023-01-03 DIAGNOSIS — E785 Hyperlipidemia, unspecified: Secondary | ICD-10-CM

## 2023-01-03 DIAGNOSIS — E1169 Type 2 diabetes mellitus with other specified complication: Secondary | ICD-10-CM

## 2023-01-03 DIAGNOSIS — F3162 Bipolar disorder, current episode mixed, moderate: Secondary | ICD-10-CM | POA: Diagnosis not present

## 2023-01-03 DIAGNOSIS — E1159 Type 2 diabetes mellitus with other circulatory complications: Secondary | ICD-10-CM

## 2023-01-03 DIAGNOSIS — E119 Type 2 diabetes mellitus without complications: Secondary | ICD-10-CM

## 2023-01-03 DIAGNOSIS — N400 Enlarged prostate without lower urinary tract symptoms: Secondary | ICD-10-CM | POA: Diagnosis not present

## 2023-01-03 DIAGNOSIS — Z7984 Long term (current) use of oral hypoglycemic drugs: Secondary | ICD-10-CM

## 2023-01-03 DIAGNOSIS — E118 Type 2 diabetes mellitus with unspecified complications: Secondary | ICD-10-CM | POA: Diagnosis not present

## 2023-01-03 LAB — BAYER DCA HB A1C WAIVED: HB A1C (BAYER DCA - WAIVED): 6.7 % — ABNORMAL HIGH (ref 4.8–5.6)

## 2023-01-03 MED ORDER — CYCLOBENZAPRINE HCL 10 MG PO TABS: 10.0000 mg | ORAL_TABLET | Freq: Three times a day (TID) | ORAL | 3 refills | Status: DC | PRN

## 2023-01-03 MED ORDER — EMPAGLIFLOZIN 25 MG PO TABS: 25.0000 mg | ORAL_TABLET | Freq: Every day | ORAL | 3 refills | Status: DC

## 2023-01-03 MED ORDER — TAMSULOSIN HCL 0.4 MG PO CAPS: 0.4000 mg | ORAL_CAPSULE | Freq: Every day | ORAL | 1 refills | Status: DC

## 2023-01-03 MED ORDER — METFORMIN HCL 1000 MG PO TABS: 1000.0000 mg | ORAL_TABLET | Freq: Two times a day (BID) | ORAL | 3 refills | Status: DC

## 2023-01-03 NOTE — Progress Notes (Signed)
BP 117/73   Pulse 76   Temp 98.7 F (37.1 C) (Oral)   Ht 6' (1.829 m)   Wt 207 lb 12.8 oz (94.3 kg)   SpO2 99%   BMI 28.18 kg/m    Subjective:   Patient ID: Adam Oneal, male    DOB: [birth year], 62 y.o.   MRN: [id number]  HPI: Adam Oneal is a 62 y.o. male presenting on 01/03/2023 for Medical Management of Chronic Issues and Diabetes   HPI Type 2 diabetes mellitus Patient comes in today for recheck of his diabetes. Patient has been currently taking Jardiance and metformin. Patient is currently on an ACE inhibitor/ARB. Patient has not seen an ophthalmologist this year. Patient denies any new issues with their feet. The symptom started onset as an adult hypertension and hyperlipidemia ARE RELATED TO DM   Hypertension Patient is currently on lisinopril, and their blood pressure today is 117/73. Patient denies any lightheadedness or dizziness. Patient denies headaches, blurred vision, chest pains, shortness of breath, or weakness. Denies any side effects from medication and is content with current medication.   Hyperlipidemia Patient is coming in for recheck of his hyperlipidemia. The patient is currently taking no medicine currently. They deny any issues with myalgias or history of liver damage from it. They deny any focal numbness or weakness or chest pain.   Bipolar Patient has bipolar and sees Dr. Tenny Craw for this and they manage it he is continue to have close follow-up with them.  Relevant past medical, surgical, family and social history reviewed and updated as indicated. Interim medical history since our last visit reviewed. Allergies and medications reviewed and updated.  Review of Systems  Constitutional:  Negative for chills and fever.  Eyes:  Negative for visual disturbance.  Respiratory:  Negative for shortness of breath and wheezing.   Cardiovascular:  Negative for chest pain and leg swelling.  Musculoskeletal:  Negative for back pain and gait problem.  Skin:   Negative for rash.  Neurological:  Negative for dizziness, weakness and light-headedness.  Psychiatric/Behavioral:  The patient is not nervous/anxious.   All other systems reviewed and are negative.   Per HPI unless specifically indicated above   Allergies as of 01/03/2023       Reactions   Lipitor [atorvastatin] Shortness Of Breath        Medication List        Accurate as of January 03, 2023  1:21 PM. If you have any questions, ask your nurse or doctor.          cyclobenzaprine 10 MG tablet Commonly known as: FLEXERIL Take 1 tablet (10 mg total) by mouth 3 (three) times daily as needed for muscle spasms.   DULoxetine 60 MG capsule Commonly known as: Cymbalta Take 1 capsule (60 mg total) by mouth daily.   empagliflozin 25 MG Tabs tablet Commonly known as: Jardiance Take 1 tablet (25 mg total) by mouth daily before breakfast.   fluticasone 50 MCG/ACT nasal spray Commonly known as: FLONASE USE 1 SPRAY IN EACH NOSTRIL DAILY   hydrOXYzine 25 MG tablet Commonly known as: ATARAX Take 1 tablet (25 mg total) by mouth every 8 (eight) hours as needed for anxiety.   lisinopril 10 MG tablet Commonly known as: ZESTRIL Take 1 tablet (10 mg total) by mouth daily.   lithium carbonate 450 MG ER tablet Commonly known as: ESKALITH Take 2 tablets (900 mg total) by mouth every morning.   metFORMIN 1000 MG tablet Commonly known as:  GLUCOPHAGE Take 1 tablet (1,000 mg total) by mouth 2 (two) times daily with a meal.   tamsulosin 0.4 MG Caps capsule Commonly known as: FLOMAX Take 1 capsule (0.4 mg total) by mouth daily.         Objective:   BP 117/73   Pulse 76   Temp 98.7 F (37.1 C) (Oral)   Ht 6' (1.829 m)   Wt 207 lb 12.8 oz (94.3 kg)   SpO2 99%   BMI 28.18 kg/m   Wt Readings from Last 3 Encounters:  01/03/23 207 lb 12.8 oz (94.3 kg)  07/19/22 209 lb (94.8 kg)  01/04/22 210 lb (95.3 kg)    Physical Exam Vitals and nursing note reviewed.  Constitutional:       General: He is not in acute distress.    Appearance: He is well-developed. He is not diaphoretic.  Eyes:     General: No scleral icterus.    Conjunctiva/sclera: Conjunctivae normal.  Neck:     Thyroid: No thyromegaly.  Cardiovascular:     Rate and Rhythm: Normal rate and regular rhythm.     Heart sounds: Normal heart sounds. No murmur heard. Pulmonary:     Effort: Pulmonary effort is normal. No respiratory distress.     Breath sounds: Normal breath sounds. No wheezing.  Musculoskeletal:        General: No swelling. Normal range of motion.     Cervical back: Neck supple.  Lymphadenopathy:     Cervical: No cervical adenopathy.  Skin:    General: Skin is warm and dry.     Findings: No rash.  Neurological:     Mental Status: He is alert and oriented to person, place, and time.     Coordination: Coordination normal.  Psychiatric:        Behavior: Behavior normal.       Assessment & Plan:   Problem List Items Addressed This Visit       Cardiovascular and Mediastinum   Hypertension associated with diabetes (HCC)   Relevant Medications   empagliflozin (JARDIANCE) 25 MG TABS tablet   metFORMIN (GLUCOPHAGE) 1000 MG tablet   Other Relevant Orders   CBC with Differential/Platelet   CMP14+EGFR   Lipid panel   Bayer DCA Hb A1c Waived   Lithium level   PSA, total and free     Endocrine   Type 2 diabetes mellitus without complication, without long-term current use of insulin (HCC)   Relevant Medications   empagliflozin (JARDIANCE) 25 MG TABS tablet   metFORMIN (GLUCOPHAGE) 1000 MG tablet   Other Relevant Orders   Microalbumin / creatinine urine ratio   Hyperlipidemia associated with type 2 diabetes mellitus (HCC)   Relevant Medications   empagliflozin (JARDIANCE) 25 MG TABS tablet   metFORMIN (GLUCOPHAGE) 1000 MG tablet     Other   Bipolar 1 disorder, mixed, moderate (HCC)   Relevant Orders   CBC with Differential/Platelet   CMP14+EGFR   Lipid panel   Bayer DCA Hb  A1c Waived   Lithium level   PSA, total and free   TSH   Other Visit Diagnoses     Controlled type 2 diabetes mellitus with complication, without long-term current use of insulin (HCC)    -  Primary   Relevant Medications   empagliflozin (JARDIANCE) 25 MG TABS tablet   metFORMIN (GLUCOPHAGE) 1000 MG tablet   Other Relevant Orders   CBC with Differential/Platelet   CMP14+EGFR   Lipid panel   Bayer DCA Hb A1c  Waived   Lithium level   PSA, total and free   Benign prostatic hyperplasia without lower urinary tract symptoms       Relevant Medications   tamsulosin (FLOMAX) 0.4 MG CAPS capsule   Other Relevant Orders   CBC with Differential/Platelet   CMP14+EGFR   Lipid panel   Bayer DCA Hb A1c Waived   Lithium level   PSA, total and free       A1c is 6.7, slightly better than last time, continue current medicine, no changes.  Looks good blood pressure everything else looks good today as well. Follow up plan: Return in about 3 months (around 04/05/2023), or if symptoms worsen or fail to improve, for Diabetes and hypertension and cholesterol recheck.  Counseling provided for all of the vaccine components Orders Placed This Encounter  Procedures   CBC with Differential/Platelet   CMP14+EGFR   Lipid panel   Bayer DCA Hb A1c Waived   Lithium level   PSA, total and free   Microalbumin / creatinine urine ratio   TSH    Arville Care, MD [facility] 01/03/2023, 1:21 PM

## 2023-01-04 LAB — CBC WITH DIFFERENTIAL/PLATELET
Basophils Absolute: 0.1 10*3/uL (ref 0.0–0.2)
Basos: 0 %
EOS (ABSOLUTE): 0.2 10*3/uL (ref 0.0–0.4)
Eos: 2 %
Hematocrit: 51.5 % — ABNORMAL HIGH (ref 37.5–51.0)
Hemoglobin: 17 g/dL (ref 13.0–17.7)
Immature Grans (Abs): 0 10*3/uL (ref 0.0–0.1)
Immature Granulocytes: 0 %
Lymphocytes Absolute: 3.7 10*3/uL — ABNORMAL HIGH (ref 0.7–3.1)
Lymphs: 29 %
MCH: 29.8 pg (ref 26.6–33.0)
MCHC: 33 g/dL (ref 31.5–35.7)
MCV: 90 fL (ref 79–97)
Monocytes Absolute: 1.7 10*3/uL — ABNORMAL HIGH (ref 0.1–0.9)
Monocytes: 14 %
Neutrophils Absolute: 7 10*3/uL (ref 1.4–7.0)
Neutrophils: 55 %
Platelets: 367 10*3/uL (ref 150–450)
RBC: 5.7 x10E6/uL (ref 4.14–5.80)
RDW: 12.9 % (ref 11.6–15.4)
WBC: 12.8 10*3/uL — ABNORMAL HIGH (ref 3.4–10.8)

## 2023-01-04 LAB — CMP14+EGFR
ALT: 12 IU/L (ref 0–44)
AST: 14 IU/L (ref 0–40)
Albumin/Globulin Ratio: 1.6 (ref 1.2–2.2)
Albumin: 4.4 g/dL (ref 3.9–4.9)
Alkaline Phosphatase: 105 IU/L (ref 44–121)
BUN/Creatinine Ratio: 17 (ref 10–24)
BUN: 18 mg/dL (ref 8–27)
Bilirubin Total: 0.3 mg/dL (ref 0.0–1.2)
CO2: 19 mmol/L — ABNORMAL LOW (ref 20–29)
Calcium: 9.4 mg/dL (ref 8.6–10.2)
Chloride: 102 mmol/L (ref 96–106)
Creatinine, Ser: 1.07 mg/dL (ref 0.76–1.27)
Globulin, Total: 2.7 g/dL (ref 1.5–4.5)
Glucose: 123 mg/dL — ABNORMAL HIGH (ref 70–99)
Potassium: 4.8 mmol/L (ref 3.5–5.2)
Sodium: 136 mmol/L (ref 134–144)
Total Protein: 7.1 g/dL (ref 6.0–8.5)
eGFR: 78 mL/min/{1.73_m2} (ref >59)

## 2023-01-04 LAB — LIPID PANEL
Chol/HDL Ratio: 3.4 ratio (ref 0.0–5.0)
Cholesterol, Total: 172 mg/dL (ref 100–199)
HDL: 51 mg/dL (ref >39)
LDL Chol Calc (NIH): 92 mg/dL (ref 0–99)
Triglycerides: 166 mg/dL — ABNORMAL HIGH (ref 0–149)
VLDL Cholesterol Cal: 29 mg/dL (ref 5–40)

## 2023-01-04 LAB — LITHIUM LEVEL: Lithium Lvl: 0.8 mmol/L (ref 0.5–1.2)

## 2023-01-04 LAB — PSA, TOTAL AND FREE
PSA, Free Pct: 12.5 %
PSA, Free: 0.1 ng/mL
Prostate Specific Ag, Serum: 0.8 ng/mL (ref 0.0–4.0)

## 2023-01-04 LAB — SPECIMEN STATUS REPORT

## 2023-01-04 LAB — MICROALBUMIN / CREATININE URINE RATIO
Creatinine, Urine: 45.8 mg/dL
Microalb/Creat Ratio: <7 mg/g creat (ref 0–29)
Microalbumin, Urine: <3 ug/mL

## 2023-01-05 LAB — TSH: TSH: 1.86 u[IU]/mL (ref 0.450–4.500)

## 2023-01-15 ENCOUNTER — Telehealth: Payer: Self-pay | Admitting: Family Medicine

## 2023-01-15 NOTE — Telephone Encounter (Signed)
Patient aware and verbalized understanding. °

## 2023-02-28 ENCOUNTER — Encounter (HOSPITAL_COMMUNITY): Payer: Self-pay | Admitting: Psychiatry

## 2023-02-28 ENCOUNTER — Telehealth (HOSPITAL_COMMUNITY): Admitting: Psychiatry

## 2023-02-28 DIAGNOSIS — F1010 Alcohol abuse, uncomplicated: Secondary | ICD-10-CM | POA: Diagnosis not present

## 2023-02-28 DIAGNOSIS — F3162 Bipolar disorder, current episode mixed, moderate: Secondary | ICD-10-CM | POA: Diagnosis not present

## 2023-02-28 MED ORDER — DULOXETINE HCL 60 MG PO CPEP: 60.0000 mg | ORAL_CAPSULE | Freq: Every day | ORAL | 2 refills | Status: DC

## 2023-02-28 MED ORDER — HYDROXYZINE HCL 25 MG PO TABS: 25.0000 mg | ORAL_TABLET | Freq: Three times a day (TID) | ORAL | 2 refills | Status: DC | PRN

## 2023-02-28 MED ORDER — LITHIUM CARBONATE ER 450 MG PO TBCR: 900.0000 mg | EXTENDED_RELEASE_TABLET | ORAL | 3 refills | Status: DC
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
## 2023-04-10 ENCOUNTER — Ambulatory Visit: Admitting: Family Medicine

## 2023-04-10 ENCOUNTER — Encounter: Payer: Self-pay | Admitting: Family Medicine

## 2023-04-10 VITALS — BP 126/81 | HR 86 | Ht 72.0 in | Wt 213.0 lb

## 2023-04-10 DIAGNOSIS — E118 Type 2 diabetes mellitus with unspecified complications: Secondary | ICD-10-CM | POA: Diagnosis not present

## 2023-04-10 DIAGNOSIS — Z23 Encounter for immunization: Secondary | ICD-10-CM

## 2023-04-10 DIAGNOSIS — E1169 Type 2 diabetes mellitus with other specified complication: Secondary | ICD-10-CM | POA: Diagnosis not present

## 2023-04-10 DIAGNOSIS — Z7984 Long term (current) use of oral hypoglycemic drugs: Secondary | ICD-10-CM

## 2023-04-10 DIAGNOSIS — E1159 Type 2 diabetes mellitus with other circulatory complications: Secondary | ICD-10-CM | POA: Diagnosis not present

## 2023-04-10 DIAGNOSIS — Z789 Other specified health status: Secondary | ICD-10-CM

## 2023-04-10 DIAGNOSIS — I152 Hypertension secondary to endocrine disorders: Secondary | ICD-10-CM

## 2023-04-10 DIAGNOSIS — E785 Hyperlipidemia, unspecified: Secondary | ICD-10-CM

## 2023-04-10 LAB — BAYER DCA HB A1C WAIVED: HB A1C (BAYER DCA - WAIVED): 6.3 % — ABNORMAL HIGH (ref 4.8–5.6)

## 2023-04-10 NOTE — Progress Notes (Signed)
BP 126/81   Pulse 86   Ht 6' (1.829 m)   Wt 213 lb (96.6 kg)   SpO2 97%   BMI 28.89 kg/m    Subjective:   Patient ID: Adam Oneal, male    DOB: [birth year], 62 y.o.   MRN: [id number]  HPI: Adam Oneal is a 62 y.o. male presenting on 04/10/2023 for Medical Management of Chronic Issues and Diabetes   HPI Type 2 diabetes mellitus Patient comes in today for recheck of his diabetes. Patient has been currently taking metformin and Jardiance. Patient is currently on an ACE inhibitor/ARB. Patient has not seen an ophthalmologist this year. Patient denies any new issues with their feet. The symptom started onset as an adult hypertension and hyperlipidemia ARE RELATED TO DM   Hypertension Patient is currently on lisinopril, and their blood pressure today is 126/81. Patient denies any lightheadedness or dizziness. Patient denies headaches, blurred vision, chest pains, shortness of breath, or weakness. Denies any side effects from medication and is content with current medication.   Hyperlipidemia Patient is coming in for recheck of his hyperlipidemia. The patient is currently taking no medicine currently, had been intolerant of statins. They deny any issues with myalgias or history of liver damage from it. They deny any focal numbness or weakness or chest pain.   Relevant past medical, surgical, family and social history reviewed and updated as indicated. Interim medical history since our last visit reviewed. Allergies and medications reviewed and updated.  Review of Systems  Constitutional:  Negative for chills and fever.  Eyes:  Negative for visual disturbance.  Respiratory:  Negative for shortness of breath and wheezing.   Cardiovascular:  Negative for chest pain and leg swelling.  Musculoskeletal:  Negative for back pain and gait problem.  Skin:  Negative for rash.  Neurological:  Negative for dizziness, weakness and numbness.  All other systems reviewed and are  negative.   Per HPI unless specifically indicated above   Allergies as of 04/10/2023       Reactions   Lipitor [atorvastatin] Shortness Of Breath        Medication List        Accurate as of April 10, 2023  1:46 PM. If you have any questions, ask your nurse or doctor.          cyclobenzaprine 10 MG tablet Commonly known as: FLEXERIL Take 1 tablet (10 mg total) by mouth 3 (three) times daily as needed for muscle spasms.   DULoxetine 60 MG capsule Commonly known as: Cymbalta Take 1 capsule (60 mg total) by mouth daily.   empagliflozin 25 MG Tabs tablet Commonly known as: Jardiance Take 1 tablet (25 mg total) by mouth daily before breakfast.   fluticasone 50 MCG/ACT nasal spray Commonly known as: FLONASE USE 1 SPRAY IN EACH NOSTRIL DAILY   hydrOXYzine 25 MG tablet Commonly known as: ATARAX Take 1 tablet (25 mg total) by mouth every 8 (eight) hours as needed for anxiety.   lisinopril 10 MG tablet Commonly known as: ZESTRIL Take 1 tablet (10 mg total) by mouth daily.   lithium carbonate 450 MG ER tablet Commonly known as: ESKALITH Take 2 tablets (900 mg total) by mouth every morning.   metFORMIN 1000 MG tablet Commonly known as: GLUCOPHAGE Take 1 tablet (1,000 mg total) by mouth 2 (two) times daily with a meal.   tamsulosin 0.4 MG Caps capsule Commonly known as: FLOMAX Take 1 capsule (0.4 mg total) by mouth daily.  Objective:   BP 126/81   Pulse 86   Ht 6' (1.829 m)   Wt 213 lb (96.6 kg)   SpO2 97%   BMI 28.89 kg/m   Wt Readings from Last 3 Encounters:  04/10/23 213 lb (96.6 kg)  01/03/23 207 lb 12.8 oz (94.3 kg)  07/19/22 209 lb (94.8 kg)    Physical Exam Vitals and nursing note reviewed.  Constitutional:      General: He is not in acute distress.    Appearance: He is well-developed. He is not diaphoretic.  Eyes:     General: No scleral icterus.    Conjunctiva/sclera: Conjunctivae normal.  Neck:     Thyroid: No thyromegaly.   Cardiovascular:     Rate and Rhythm: Normal rate and regular rhythm.     Heart sounds: Normal heart sounds. No murmur heard. Pulmonary:     Effort: Pulmonary effort is normal. No respiratory distress.     Breath sounds: Normal breath sounds. No wheezing.  Musculoskeletal:        General: Normal range of motion.     Cervical back: Neck supple.  Lymphadenopathy:     Cervical: No cervical adenopathy.  Skin:    General: Skin is warm and dry.     Findings: No rash.  Neurological:     Mental Status: He is alert and oriented to person, place, and time.     Coordination: Coordination normal.  Psychiatric:        Behavior: Behavior normal.       Assessment & Plan:   Problem List Items Addressed This Visit       Cardiovascular and Mediastinum   Hypertension associated with diabetes (HCC)     Endocrine   Hyperlipidemia associated with type 2 diabetes mellitus (HCC)   Controlled type 2 diabetes mellitus with complication, without long-term current use of insulin (HCC) - Primary   Relevant Orders   Bayer DCA Hb A1c Waived   Other Visit Diagnoses     Statin intolerance       Need for shingles vaccine       Relevant Orders   Zoster Recombinant (Shingrix ) (Completed)   Need for Tdap vaccination       Relevant Orders   Tdap vaccine greater than or equal to 7yo IM (Completed)       A1c looks good at 6.3.  No changes, continue with exercise Follow up plan: Return in about 3 months (around 07/10/2023), or if symptoms worsen or fail to improve, for Diabetes recheck.  Counseling provided for all of the vaccine components Orders Placed This Encounter  Procedures   Zoster Recombinant (Shingrix )   Tdap vaccine greater than or equal to 7yo IM   Bayer DCA Hb A1c Waived    Arville Care, MD [facility] 04/10/2023, 1:46 PM

## 2023-05-27 ENCOUNTER — Telehealth (INDEPENDENT_AMBULATORY_CARE_PROVIDER_SITE_OTHER): Admitting: Psychiatry

## 2023-05-27 ENCOUNTER — Encounter (HOSPITAL_COMMUNITY): Payer: Self-pay | Admitting: Psychiatry

## 2023-05-27 DIAGNOSIS — F3162 Bipolar disorder, current episode mixed, moderate: Secondary | ICD-10-CM | POA: Diagnosis not present

## 2023-05-27 MED ORDER — DULOXETINE HCL 60 MG PO CPEP: 60.0000 mg | ORAL_CAPSULE | Freq: Every day | ORAL | 2 refills | Status: DC

## 2023-05-27 MED ORDER — HYDROXYZINE HCL 25 MG PO TABS: 25.0000 mg | ORAL_TABLET | Freq: Three times a day (TID) | ORAL | 2 refills | Status: DC | PRN

## 2023-05-27 MED ORDER — LITHIUM CARBONATE ER 450 MG PO TBCR: 900.0000 mg | EXTENDED_RELEASE_TABLET | ORAL | 3 refills | Status: DC

## 2023-05-27 NOTE — Progress Notes (Signed)
Virtual Visit via Video Note  I connected with Adam Oneal on 05/27/23 at  8:40 AM EDT by a video enabled telemedicine application and verified that I am speaking with the correct person using two identifiers.  Location: Patient: home Provider: office   I discussed the limitations of evaluation and management by telemedicine and the availability of in person appointments. The patient expressed understanding and agreed to proceed.      I discussed the assessment and treatment plan with the patient. The patient was provided an opportunity to ask questions and all were answered. The patient agreed with the plan and demonstrated an understanding of the instructions.   The patient was advised to call back or seek an in-person evaluation if the symptoms worsen or if the condition fails to improve as anticipated.  I provided 20 minutes of non-face-to-face time during this encounter.   Diannia Ruder, MD  [facility] MD/PA/NP OP Progress Note  05/27/2023 8:55 AM Adam Oneal  MRN:  [id number]  Chief Complaint:  Chief Complaint  Patient presents with   Depression   Manic Behavior   Follow-up   HPI: This patient is a 63 year old widowed white male who lives in [place].  He is retired from the [employer].  The patient returns for follow-up after 3 months regarding his bipolar disorder.  He states that his younger brother died a few weeks ago.  He had just seen the brother earlier in the summer when he went to visit him in [place] state.  He states that the brother had cognitive delays and was not taking good care of his diabetes.  He is sad about it but seems to have come to terms with it.  He states overall his mood has been good and he has continued to exercise daily.  He is sleeping well.  All of his recent labs look good and his A1c has come down.  Lithium level is 0.4 but he claims he has been compliant.  He denies any manic symptoms such as racing thoughts hyperactivity or  impulsivity.  He also denies severe depression thoughts of self-harm or suicide Visit Diagnosis:    ICD-10-CM   1. Bipolar 1 disorder, mixed, moderate (HCC)  F31.62       Past Psychiatric History: Long-term outpatient treatment for bipolar disorder  Past Medical History:  Past Medical History:  Diagnosis Date   Acid reflux    Anxiety 01/1998   Cataract 02/2004   Depression 01/1998   Diabetes mellitus without complication (HCC) 12/1999   Diabetes mellitus, type II (HCC)    Hyperlipidemia 11/2006   Hypertension    Hypertension    History reviewed. No pertinent surgical history.  Family Psychiatric History: See below  Family History:  Family History  Problem Relation Age of Onset   Cancer Mother 65       lung   Heart disease Father    Hyperlipidemia Father    Diabetes Brother    Leukemia Brother    Diabetes Paternal Grandmother    Heart disease Paternal Grandmother    Alcohol abuse Paternal Grandfather    Diabetes Brother    Depression Maternal Aunt     Social History:  Social History   Socioeconomic History   Marital status: Widowed    Spouse name: Not on file   Number of children: Not on file   Years of education: Not on file   Highest education level: Associate degree: occupational, Scientist, product/process development, or vocational program  Occupational History  Not on file  Tobacco Use   Smoking status: Former    Current packs/day: 0.00    Average packs/day: 1 pack/day for 12.0 years (12.0 ttl pk-yrs)    Types: Cigarettes    Start date: 01/05/1978    Quit date: 12/28/1989    Years since quitting: 33.4   Smokeless tobacco: Former  Building services engineer status: Never Used  Substance and Sexual Activity   Alcohol use: No    Alcohol/week: 0.0 standard drinks of alcohol    Comment: 03-26-16 per pt no   Drug use: No    Comment: 03-26-16 per pt currently no but use to   Sexual activity: Never  Other Topics Concern   Not on file  Social History Narrative   Not on file   Social  Determinants of Health   Financial Resource Strain: Low Risk  (12/30/2022)   Overall Financial Resource Strain (CARDIA)    Difficulty of Paying Living Expenses: Not hard at all  Food Insecurity: No Food Insecurity (12/30/2022)   Hunger Vital Sign    Worried About Running Out of Food in the Last Year: Never true    Ran Out of Food in the Last Year: Never true  Transportation Needs: No Transportation Needs (12/30/2022)   PRAPARE - Administrator, Civil Service (Medical): No    Lack of Transportation (Non-Medical): No  Physical Activity: Sufficiently Active (12/30/2022)   Exercise Vital Sign    Days of Exercise per Week: 6 days    Minutes of Exercise per Session: 30 min  Stress: No Stress Concern Present (12/30/2022)   [employer] of Occupational Health - Occupational Stress Questionnaire    Feeling of Stress : Not at all  Social Connections: Socially Isolated (12/30/2022)   Social Connection and Isolation Panel [NHANES]    Frequency of Communication with Friends and Family: Never    Frequency of Social Gatherings with Friends and Family: Never    Attends Religious Services: Never    Database administrator or Organizations: No    Attends Engineer, structural: Not on file    Marital Status: Widowed    Allergies:  Allergies  Allergen Reactions   Lipitor [Atorvastatin] Shortness Of Breath    Metabolic Disorder Labs: Lab Results  Component Value Date   HGBA1C 6.3 (H) 04/10/2023   No results found for: "PROLACTIN" Lab Results  Component Value Date   CHOL 172 01/03/2023   TRIG 166 (H) 01/03/2023   HDL 51 01/03/2023   CHOLHDL 3.4 01/03/2023   LDLCALC 92 01/03/2023   LDLCALC 51 07/19/2022   Lab Results  Component Value Date   TSH 1.860 01/03/2023   TSH 1.580 11/07/2020    Therapeutic Level Labs: Lab Results  Component Value Date   LITHIUM 0.8 01/03/2023   LITHIUM 0.4 (L) 07/19/2022   No results found for: "VALPROATE" No results found for:  "CBMZ"  Current Medications: Current Outpatient Medications  Medication Sig Dispense Refill   cyclobenzaprine (FLEXERIL) 10 MG tablet Take 1 tablet (10 mg total) by mouth 3 (three) times daily as needed for muscle spasms. 270 tablet 3   DULoxetine (CYMBALTA) 60 MG capsule Take 1 capsule (60 mg total) by mouth daily. 90 capsule 2   empagliflozin (JARDIANCE) 25 MG TABS tablet Take 1 tablet (25 mg total) by mouth daily before breakfast. 90 tablet 3   fluticasone (FLONASE) 50 MCG/ACT nasal spray USE 1 SPRAY IN EACH NOSTRIL DAILY 48 g 1   hydrOXYzine (  ATARAX) 25 MG tablet Take 1 tablet (25 mg total) by mouth every 8 (eight) hours as needed for anxiety. 30 tablet 2   lisinopril (ZESTRIL) 10 MG tablet Take 1 tablet (10 mg total) by mouth daily. 90 tablet 3   lithium carbonate (ESKALITH) 450 MG ER tablet Take 2 tablets (900 mg total) by mouth every morning. 180 tablet 3   metFORMIN (GLUCOPHAGE) 1000 MG tablet Take 1 tablet (1,000 mg total) by mouth 2 (two) times daily with a meal. 180 tablet 3   tamsulosin (FLOMAX) 0.4 MG CAPS capsule Take 1 capsule (0.4 mg total) by mouth daily. 90 capsule 1   No current facility-administered medications for this visit.     Musculoskeletal: Strength & Muscle Tone: within normal limits Gait & Station: normal Patient leans: N/A  Psychiatric Specialty Exam: Review of Systems  All other systems reviewed and are negative.   There were no vitals taken for this visit.There is no height or weight on file to calculate BMI.  General Appearance: Casual and Fairly Groomed  Eye Contact:  Good  Speech:  Clear and Coherent  Volume:  Normal  Mood:  Euthymic  Affect:  Congruent  Thought Process:  Goal Directed  Orientation:  Full (Time, Place, and Person)  Thought Content: WDL   Suicidal Thoughts:  No  Homicidal Thoughts:  No  Memory:  Immediate;   Good Recent;   Good Remote;   Fair  Judgement:  Good  Insight:  Fair  Psychomotor Activity:  Normal   Concentration:  Concentration: Good and Attention Span: Good  Recall:  Good  Fund of Knowledge: Good  Language: Good  Akathisia:  No  Handed:  Right  AIMS (if indicated): not done  Assets:  Communication Skills Desire for Improvement Physical Health Resilience Social Support  ADL's:  Intact  Cognition: WNL  Sleep:  Good   Screenings: AUDIT    Flowsheet Row Office Visit from 01/03/2023 in [facility]  Alcohol Use Disorder Identification Test Final Score (AUDIT) 6      GAD-7    Flowsheet Row Office Visit from 04/10/2023 in [facility] Office Visit from 01/03/2023 in [facility] Office Visit from 07/19/2022 in [facility] Office Visit from 01/04/2022 in [facility] Office Visit from 10/04/2021 in [facility]  Total GAD-7 Score 0 0 0 0 0      PHQ2-9    Flowsheet Row Office Visit from 04/10/2023 in [facility] Office Visit from 01/03/2023 in [facility] Office Visit from 07/19/2022 in [facility] Video Visit from 02/21/2022 in [facility] [place] Office Visit from 01/04/2022 in [facility]  PHQ-2 Total Score 0 0 0 0 2  PHQ-9 Total Score 0 0 -- -- 2      Flowsheet Row Video Visit from 02/21/2022 in [facility] [place] Video Visit from 10/26/2021 in [facility] [place] Video Visit from 06/02/2021 in [facility] [place]  C-SSRS RISK CATEGORY No Risk No Risk No Risk        Assessment and Plan: This patient is a 62 year old male with a history of bipolar disorder.  He is doing well on his current regimen.  He will  continue lithium carbonate  900 mg daily for mood stabilization, Cymbalta 60 mg daily for depression and hydroxyzine 25 mg at bedtime as needed for sleep for or anxiety.  He will return to see me in 3 months  Collaboration of Care: Collaboration of Care: Primary Care Provider AEB notes are shared with PCP through the epic system  Patient/Guardian was advised Release of Information must be obtained prior to any record release in order to collaborate their care with an outside provider. Patient/Guardian was advised if they have not already done so to contact the registration department to sign all necessary forms in order for [place] to release information regarding their care.   Consent: Patient/Guardian gives verbal consent for treatment and assignment of benefits for services provided during this visit. Patient/Guardian expressed understanding and agreed to proceed.    Diannia Ruder, MD 05/27/2023, 8:55 AM

## 2023-07-10 ENCOUNTER — Ambulatory Visit: Admitting: Family Medicine

## 2023-07-29 ENCOUNTER — Ambulatory Visit: Admitting: Family Medicine

## 2023-07-29 ENCOUNTER — Encounter (INDEPENDENT_AMBULATORY_CARE_PROVIDER_SITE_OTHER): Payer: Self-pay

## 2023-07-29 ENCOUNTER — Encounter: Payer: Self-pay | Admitting: Family Medicine

## 2023-07-29 VITALS — BP 135/81 | HR 70 | Temp 98.6°F | Ht 72.0 in | Wt 220.0 lb

## 2023-07-29 DIAGNOSIS — E1159 Type 2 diabetes mellitus with other circulatory complications: Secondary | ICD-10-CM | POA: Diagnosis not present

## 2023-07-29 DIAGNOSIS — E1169 Type 2 diabetes mellitus with other specified complication: Secondary | ICD-10-CM

## 2023-07-29 DIAGNOSIS — Z7984 Long term (current) use of oral hypoglycemic drugs: Secondary | ICD-10-CM

## 2023-07-29 DIAGNOSIS — I152 Hypertension secondary to endocrine disorders: Secondary | ICD-10-CM

## 2023-07-29 DIAGNOSIS — N400 Enlarged prostate without lower urinary tract symptoms: Secondary | ICD-10-CM

## 2023-07-29 DIAGNOSIS — E785 Hyperlipidemia, unspecified: Secondary | ICD-10-CM

## 2023-07-29 DIAGNOSIS — E118 Type 2 diabetes mellitus with unspecified complications: Secondary | ICD-10-CM | POA: Diagnosis not present

## 2023-07-29 DIAGNOSIS — E119 Type 2 diabetes mellitus without complications: Secondary | ICD-10-CM

## 2023-07-29 DIAGNOSIS — Z789 Other specified health status: Secondary | ICD-10-CM

## 2023-07-29 LAB — BAYER DCA HB A1C WAIVED: HB A1C (BAYER DCA - WAIVED): 6.6 % — ABNORMAL HIGH (ref 4.8–5.6)

## 2023-07-29 MED ORDER — TAMSULOSIN HCL 0.4 MG PO CAPS
0.4000 mg | ORAL_CAPSULE | Freq: Every day | ORAL | 3 refills | Status: DC
Start: 2023-07-29 — End: 2024-05-01

## 2023-07-29 NOTE — Progress Notes (Signed)
BP 135/81   Pulse 70   Temp 98.6 F (37 C)   Ht 6' (1.829 m)   Wt 220 lb (99.8 kg)   SpO2 97%   BMI 29.84 kg/m    Subjective:   Patient ID: Adam Oneal, male    DOB: 06/26/1961, 62 y.o.   MRN: [id number]  HPI: Adam Oneal is a 62 y.o. male presenting on 07/29/2023 for Medical Management of Chronic Issues   HPI Type 2 diabetes mellitus Patient comes in today for recheck of his diabetes. Patient has been currently taking Jardiance and metformin. Patient is currently on an ACE inhibitor/ARB. Patient has not seen an ophthalmologist this year. Patient denies any new issues with their feet. The symptom started onset as an adult hypertension and hyperlipidemia ARE RELATED TO DM   Hypertension Patient is currently on lisinopril, and their blood pressure today is 135/81. Patient denies any lightheadedness or dizziness. Patient denies headaches, blurred vision, chest pains, shortness of breath, or weakness. Denies any side effects from medication and is content with current medication.   Hyperlipidemia Patient is coming in for recheck of his hyperlipidemia. The patient is currently taking no medicine, has been intolerant to statins. They deny any issues with myalgias or history of liver damage from it. They deny any focal numbness or weakness or chest pain.   Relevant past medical, surgical, family and social history reviewed and updated as indicated. Interim medical history since our last visit reviewed. Allergies and medications reviewed and updated.  Review of Systems  Constitutional:  Negative for chills and fever.  Eyes:  Negative for visual disturbance.  Respiratory:  Negative for shortness of breath and wheezing.   Cardiovascular:  Negative for chest pain and leg swelling.  Musculoskeletal:  Negative for back pain and gait problem.  Skin:  Negative for rash.  Neurological:  Negative for dizziness and light-headedness.  All other systems reviewed and are  negative.   Per HPI unless specifically indicated above   Allergies as of 07/29/2023       Reactions   Lipitor [atorvastatin] Shortness Of Breath        Medication List        Accurate as of July 29, 2023  1:57 PM. If you have any questions, ask your nurse or doctor.          cyclobenzaprine 10 MG tablet Commonly known as: FLEXERIL Take 1 tablet (10 mg total) by mouth 3 (three) times daily as needed for muscle spasms.   DULoxetine 60 MG capsule Commonly known as: Cymbalta Take 1 capsule (60 mg total) by mouth daily.   empagliflozin 25 MG Tabs tablet Commonly known as: Jardiance Take 1 tablet (25 mg total) by mouth daily before breakfast.   fluticasone 50 MCG/ACT nasal spray Commonly known as: FLONASE USE 1 SPRAY IN EACH NOSTRIL DAILY   hydrOXYzine 25 MG tablet Commonly known as: ATARAX Take 1 tablet (25 mg total) by mouth every 8 (eight) hours as needed for anxiety.   lisinopril 10 MG tablet Commonly known as: ZESTRIL Take 1 tablet (10 mg total) by mouth daily.   lithium carbonate 450 MG ER tablet Commonly known as: ESKALITH Take 2 tablets (900 mg total) by mouth every morning.   metFORMIN 1000 MG tablet Commonly known as: GLUCOPHAGE Take 1 tablet (1,000 mg total) by mouth 2 (two) times daily with a meal.   tamsulosin 0.4 MG Caps capsule Commonly known as: FLOMAX Take 1 capsule (0.4 mg total) by mouth  daily.         Objective:   BP 135/81   Pulse 70   Temp 98.6 F (37 C)   Ht 6' (1.829 m)   Wt 220 lb (99.8 kg)   SpO2 97%   BMI 29.84 kg/m   Wt Readings from Last 3 Encounters:  07/29/23 220 lb (99.8 kg)  04/10/23 213 lb (96.6 kg)  01/03/23 207 lb 12.8 oz (94.3 kg)    Physical Exam Vitals and nursing note reviewed.  Constitutional:      General: He is not in acute distress.    Appearance: He is well-developed. He is not diaphoretic.  Eyes:     General: No scleral icterus.    Conjunctiva/sclera: Conjunctivae normal.  Neck:      Thyroid: No thyromegaly.  Cardiovascular:     Rate and Rhythm: Normal rate and regular rhythm.     Heart sounds: Normal heart sounds. No murmur heard. Pulmonary:     Effort: Pulmonary effort is normal. No respiratory distress.     Breath sounds: Normal breath sounds. No wheezing.  Musculoskeletal:        General: No swelling. Normal range of motion.     Cervical back: Neck supple.  Lymphadenopathy:     Cervical: No cervical adenopathy.  Skin:    General: Skin is warm and dry.     Findings: No rash.  Neurological:     Mental Status: He is alert and oriented to person, place, and time.     Coordination: Coordination normal.  Psychiatric:        Behavior: Behavior normal.       Assessment & Plan:   Problem List Items Addressed This Visit       Cardiovascular and Mediastinum   Hypertension associated with diabetes (HCC)   Relevant Orders   CBC with Differential/Platelet     Endocrine   Hyperlipidemia associated with type 2 diabetes mellitus (HCC) - Primary   Relevant Orders   Lipid panel   Controlled type 2 diabetes mellitus with complication, without long-term current use of insulin (HCC)   Other Visit Diagnoses       Type 2 diabetes mellitus without complication, without long-term current use of insulin (HCC)       Relevant Orders   Bayer DCA Hb A1c Waived   CMP14+EGFR     Benign prostatic hyperplasia without lower urinary tract symptoms         Statin intolerance         A1c 6.6, slightly elevated both the holidays is not too bad, just focus on diet.  No medication changes at this point.  Blood pressure and everything else looks good.  Follow up plan: Return in about 3 months (around 10/27/2023), or if symptoms worsen or fail to improve, for Diabetes recheck.  Counseling provided for all of the vaccine components Orders Placed This Encounter  Procedures   Bayer DCA Hb A1c Waived   CMP14+EGFR   CBC with Differential/Platelet   Lipid panel    Arville Care, MD [facility] 07/29/2023, 1:57 PM

## 2023-07-30 LAB — CBC WITH DIFFERENTIAL/PLATELET
Basophils Absolute: 0 10*3/uL (ref 0.0–0.2)
Basos: 0 %
EOS (ABSOLUTE): 0.3 10*3/uL (ref 0.0–0.4)
Eos: 3 %
Hematocrit: 47 % (ref 37.5–51.0)
Hemoglobin: 15.2 g/dL (ref 13.0–17.7)
Immature Grans (Abs): 0 10*3/uL (ref 0.0–0.1)
Immature Granulocytes: 0 %
Lymphocytes Absolute: 4 10*3/uL — ABNORMAL HIGH (ref 0.7–3.1)
Lymphs: 41 %
MCH: 29.6 pg (ref 26.6–33.0)
MCHC: 32.3 g/dL (ref 31.5–35.7)
MCV: 91 fL (ref 79–97)
Monocytes Absolute: 1.5 10*3/uL — ABNORMAL HIGH (ref 0.1–0.9)
Monocytes: 15 %
Neutrophils Absolute: 4.1 10*3/uL (ref 1.4–7.0)
Neutrophils: 41 %
Platelets: 448 10*3/uL (ref 150–450)
RBC: 5.14 x10E6/uL (ref 4.14–5.80)
RDW: 13.3 % (ref 11.6–15.4)
WBC: 9.9 10*3/uL (ref 3.4–10.8)

## 2023-07-30 LAB — LIPID PANEL
Chol/HDL Ratio: 4.1 {ratio} (ref 0.0–5.0)
Cholesterol, Total: 165 mg/dL (ref 100–199)
HDL: 40 mg/dL (ref >39)
LDL Chol Calc (NIH): 93 mg/dL (ref 0–99)
Triglycerides: 183 mg/dL — ABNORMAL HIGH (ref 0–149)
VLDL Cholesterol Cal: 32 mg/dL (ref 5–40)

## 2023-07-30 LAB — CMP14+EGFR
ALT: 44 [IU]/L (ref 0–44)
AST: 21 [IU]/L (ref 0–40)
Albumin: 3.9 g/dL (ref 3.9–4.9)
Alkaline Phosphatase: 89 [IU]/L (ref 44–121)
BUN/Creatinine Ratio: 18 (ref 10–24)
BUN: 14 mg/dL (ref 8–27)
Bilirubin Total: <0.2 mg/dL (ref 0.0–1.2)
CO2: 21 mmol/L (ref 20–29)
Calcium: 9.3 mg/dL (ref 8.6–10.2)
Chloride: 107 mmol/L — ABNORMAL HIGH (ref 96–106)
Creatinine, Ser: 0.79 mg/dL (ref 0.76–1.27)
Globulin, Total: 2.8 g/dL (ref 1.5–4.5)
Glucose: 133 mg/dL — ABNORMAL HIGH (ref 70–99)
Potassium: 4.8 mmol/L (ref 3.5–5.2)
Sodium: 141 mmol/L (ref 134–144)
Total Protein: 6.7 g/dL (ref 6.0–8.5)
eGFR: 99 mL/min/{1.73_m2} (ref >59)

## 2023-08-26 ENCOUNTER — Telehealth (HOSPITAL_COMMUNITY): Admitting: Psychiatry

## 2023-08-26 ENCOUNTER — Encounter (HOSPITAL_COMMUNITY): Payer: Self-pay | Admitting: Psychiatry

## 2023-08-26 DIAGNOSIS — F3162 Bipolar disorder, current episode mixed, moderate: Secondary | ICD-10-CM

## 2023-08-26 MED ORDER — HYDROXYZINE HCL 25 MG PO TABS: 25.0000 mg | ORAL_TABLET | Freq: Three times a day (TID) | ORAL | 2 refills | Status: DC | PRN

## 2023-08-26 MED ORDER — LITHIUM CARBONATE ER 450 MG PO TBCR: 900.0000 mg | EXTENDED_RELEASE_TABLET | ORAL | 3 refills | Status: DC

## 2023-08-26 MED ORDER — DULOXETINE HCL 60 MG PO CPEP: 60.0000 mg | ORAL_CAPSULE | Freq: Every day | ORAL | 2 refills | Status: DC

## 2023-08-26 NOTE — Progress Notes (Signed)
Virtual Visit via Video Note  I connected with Adam Oneal on 08/26/23 at  9:00 AM EST by a video enabled telemedicine application and verified that I am speaking with the correct person using two identifiers.  Location: Patient: home Provider: office   I discussed the limitations of evaluation and management by telemedicine and the availability of in person appointments. The patient expressed understanding and agreed to proceed.      I discussed the assessment and treatment plan with the patient. The patient was provided an opportunity to ask questions and all were answered. The patient agreed with the plan and demonstrated an understanding of the instructions.   The patient was advised to call back or seek an in-person evaluation if the symptoms worsen or if the condition fails to improve as anticipated.  I provided 20 minutes of non-face-to-face time during this encounter.   Diannia Ruder, MD  [facility] MD/PA/NP OP Progress Note  08/26/2023 9:24 AM Adam Oneal  MRN:  [id number]  Chief Complaint:  Chief Complaint  Patient presents with   Depression   Manic Behavior   Follow-up   HPI: This patient is a 63 year old widowed white male who lives in [place].  He is retired from the [employer].  The patient returns for follow-up after 3 months regarding his bipolar disorder.  He states for the most part his mood has been stable.  He denies significant depression.  He is sleeping well most of the time he denies any manic symptoms.  He denies any anxiety.  He recently had laboratories for his PCP and his comprehensive metabolic panel looks good although slightly elevated glucose and A1c is back up to 6.6.  We will check a lithium level next time.  He denies any manic symptoms such as racing thoughts hyperactivity or impulsivity.  He denies severe depressive thoughts of self-harm or suicide.  He seems to be in good spirits Visit Diagnosis:    ICD-10-CM   1. Bipolar 1 disorder,  mixed, moderate (HCC)  F31.62       Past Psychiatric History: Long-term outpatient treatment for bipolar disorder  Past Medical History:  Past Medical History:  Diagnosis Date   Acid reflux    Anxiety 01/1998   Cataract 02/2004   Depression 01/1998   Diabetes mellitus without complication (HCC) 12/1999   Diabetes mellitus, type II (HCC)    Hyperlipidemia 11/2006   Hypertension    Hypertension    History reviewed. No pertinent surgical history.  Family Psychiatric History: See below  Family History:  Family History  Problem Relation Age of Onset   Cancer Mother 41       lung   Heart disease Father    Hyperlipidemia Father    Diabetes Brother    Leukemia Brother    Diabetes Paternal Grandmother    Heart disease Paternal Grandmother    Alcohol abuse Paternal Grandfather    Diabetes Brother    Depression Maternal Aunt     Social History:  Social History   Socioeconomic History   Marital status: Widowed    Spouse name: Not on file   Number of children: Not on file   Years of education: Not on file   Highest education level: Associate degree: occupational, Scientist, product/process development, or vocational program  Occupational History   Not on file  Tobacco Use   Smoking status: Former    Current packs/day: 0.00    Average packs/day: 1 pack/day for 12.0 years (12.0 ttl pk-yrs)  Types: Cigarettes    Start date: 01/05/1978    Quit date: 12/28/1989    Years since quitting: 33.6   Smokeless tobacco: Former  Building services engineer status: Never Used  Substance and Sexual Activity   Alcohol use: No    Alcohol/week: 0.0 standard drinks of alcohol    Comment: 03-26-16 per pt no   Drug use: No    Comment: 03-26-16 per pt currently no but use to   Sexual activity: Never  Other Topics Concern   Not on file  Social History Narrative   Not on file   Social Drivers of Health   Financial Resource Strain: Low Risk  (07/24/2023)   Overall Financial Resource Strain (CARDIA)    Difficulty of  Paying Living Expenses: Not hard at all  Food Insecurity: No Food Insecurity (07/24/2023)   Hunger Vital Sign    Worried About Running Out of Food in the Last Year: Never true    Ran Out of Food in the Last Year: Never true  Transportation Needs: Unmet Transportation Needs (07/24/2023)   PRAPARE - Administrator, Civil Service (Medical): Yes    Lack of Transportation (Non-Medical): No  Physical Activity: Insufficiently Active (07/24/2023)   Exercise Vital Sign    Days of Exercise per Week: 2 days    Minutes of Exercise per Session: 30 min  Stress: No Stress Concern Present (07/24/2023)   [employer] of Occupational Health - Occupational Stress Questionnaire    Feeling of Stress : Not at all  Social Connections: Socially Isolated (07/24/2023)   Social Connection and Isolation Panel [NHANES]    Frequency of Communication with Friends and Family: Once a week    Frequency of Social Gatherings with Friends and Family: Never    Attends Religious Services: Never    Database administrator or Organizations: No    Attends Engineer, structural: Not on file    Marital Status: Widowed    Allergies:  Allergies  Allergen Reactions   Lipitor [Atorvastatin] Shortness Of Breath    Metabolic Disorder Labs: Lab Results  Component Value Date   HGBA1C 6.6 (H) 07/29/2023   No results found for: "PROLACTIN" Lab Results  Component Value Date   CHOL 165 07/29/2023   TRIG 183 (H) 07/29/2023   HDL 40 07/29/2023   CHOLHDL 4.1 07/29/2023   LDLCALC 93 07/29/2023   LDLCALC 92 01/03/2023   Lab Results  Component Value Date   TSH 1.860 01/03/2023   TSH 1.580 11/07/2020    Therapeutic Level Labs: Lab Results  Component Value Date   LITHIUM 0.8 01/03/2023   LITHIUM 0.4 (L) 07/19/2022   No results found for: "VALPROATE" No results found for: "CBMZ"  Current Medications: Current Outpatient Medications  Medication Sig Dispense Refill   cyclobenzaprine (FLEXERIL) 10  MG tablet Take 1 tablet (10 mg total) by mouth 3 (three) times daily as needed for muscle spasms. 270 tablet 3   DULoxetine (CYMBALTA) 60 MG capsule Take 1 capsule (60 mg total) by mouth daily. 90 capsule 2   empagliflozin (JARDIANCE) 25 MG TABS tablet Take 1 tablet (25 mg total) by mouth daily before breakfast. 90 tablet 3   fluticasone (FLONASE) 50 MCG/ACT nasal spray USE 1 SPRAY IN EACH NOSTRIL DAILY 48 g 1   hydrOXYzine (ATARAX) 25 MG tablet Take 1 tablet (25 mg total) by mouth every 8 (eight) hours as needed for anxiety. 30 tablet 2   lisinopril (ZESTRIL) 10 MG tablet Take  1 tablet (10 mg total) by mouth daily. 90 tablet 3   lithium carbonate (ESKALITH) 450 MG ER tablet Take 2 tablets (900 mg total) by mouth every morning. 180 tablet 3   metFORMIN (GLUCOPHAGE) 1000 MG tablet Take 1 tablet (1,000 mg total) by mouth 2 (two) times daily with a meal. 180 tablet 3   tamsulosin (FLOMAX) 0.4 MG CAPS capsule Take 1 capsule (0.4 mg total) by mouth daily. 90 capsule 3   No current facility-administered medications for this visit.     Musculoskeletal: Strength & Muscle Tone: within normal limits Gait & Station: normal Patient leans: N/A  Psychiatric Specialty Exam: Review of Systems  Musculoskeletal:  Positive for back pain.  All other systems reviewed and are negative.   There were no vitals taken for this visit.There is no height or weight on file to calculate BMI.  General Appearance: Casual and Fairly Groomed  Eye Contact:  Good  Speech:  Clear and Coherent  Volume:  Normal  Mood:  Euthymic  Affect:  Congruent  Thought Process:  Goal Directed  Orientation:  Full (Time, Place, and Person)  Thought Content: WDL   Suicidal Thoughts:  No  Homicidal Thoughts:  No  Memory:  Immediate;   Good Recent;   Good Remote;   NA  Judgement:  Good  Insight:  Fair  Psychomotor Activity:  Normal  Concentration:  Concentration: Good and Attention Span: Good  Recall:  Good  Fund of Knowledge:  Good  Language: Good  Akathisia:  No  Handed:  Right  AIMS (if indicated): not done  Assets:  Communication Skills Desire for Improvement Physical Health Resilience Social Support Talents/Skills  ADL's:  Intact  Cognition: WNL  Sleep:  Good   Screenings: AUDIT    Flowsheet Row Office Visit from 01/03/2023 in [facility]  Alcohol Use Disorder Identification Test Final Score (AUDIT) 6      GAD-7    Flowsheet Row Office Visit from 07/29/2023 in [facility] Office Visit from 04/10/2023 in [facility] Office Visit from 01/03/2023 in [facility] Office Visit from 07/19/2022 in [facility] Office Visit from 01/04/2022 in [facility]  Total GAD-7 Score 3 0 0 0 0      PHQ2-9    Flowsheet Row Office Visit from 07/29/2023 in [facility] Office Visit from 04/10/2023 in [facility] Office Visit from 01/03/2023 in [facility] Office Visit from 07/19/2022 in [facility] Video Visit from 02/21/2022 in [facility] Total Score 2 0 0 0 0  PHQ-9 Total Score 5 0 0 -- --      Flowsheet Row Video Visit from 02/21/2022 in [facility] [place] Video Visit from 10/26/2021 in [facility] [place] Video Visit from 06/02/2021 in [facility] [place]  C-SSRS RISK CATEGORY No Risk No Risk No Risk        Assessment and Plan: This patient is a 63 year old male with a history of bipolar disorder.  He continues to do well on his current regimen.  He will continue lithium carbonate 900 mg daily for mood stabilization, Cymbalta  60 mg daily for depression and hydroxyzine 25 mg at bedtime as needed for sleep  or anxiety.  He will return to see me in 3 months  Collaboration of Care: Collaboration of Care: Primary Care Provider AEB notes are shared with PCP through the epic system  Patient/Guardian was advised Release of Information must be obtained prior to any record release in order to collaborate their care with an outside provider. Patient/Guardian was advised if they have not already done so to contact the registration department to sign all necessary forms in order for [place] to release information regarding their care.   Consent: Patient/Guardian gives verbal consent for treatment and assignment of benefits for services provided during this visit. Patient/Guardian expressed understanding and agreed to proceed.    Diannia Ruder, MD 08/26/2023, 9:24 AM

## 2023-08-27 ENCOUNTER — Telehealth (HOSPITAL_COMMUNITY): Admitting: Psychiatry

## 2023-10-09 ENCOUNTER — Encounter (HOSPITAL_COMMUNITY): Payer: Self-pay

## 2023-10-28 ENCOUNTER — Ambulatory Visit: Admitting: Family Medicine

## 2023-10-28 ENCOUNTER — Encounter: Payer: Self-pay | Admitting: Family Medicine

## 2023-10-28 VITALS — BP 116/76 | HR 69 | Ht 72.0 in | Wt 210.0 lb

## 2023-10-28 DIAGNOSIS — Z7984 Long term (current) use of oral hypoglycemic drugs: Secondary | ICD-10-CM

## 2023-10-28 DIAGNOSIS — E1159 Type 2 diabetes mellitus with other circulatory complications: Secondary | ICD-10-CM | POA: Diagnosis not present

## 2023-10-28 DIAGNOSIS — I152 Hypertension secondary to endocrine disorders: Secondary | ICD-10-CM

## 2023-10-28 DIAGNOSIS — E119 Type 2 diabetes mellitus without complications: Secondary | ICD-10-CM

## 2023-10-28 DIAGNOSIS — N401 Enlarged prostate with lower urinary tract symptoms: Secondary | ICD-10-CM

## 2023-10-28 DIAGNOSIS — R3916 Straining to void: Secondary | ICD-10-CM

## 2023-10-28 DIAGNOSIS — N400 Enlarged prostate without lower urinary tract symptoms: Secondary | ICD-10-CM | POA: Insufficient documentation

## 2023-10-28 LAB — BAYER DCA HB A1C WAIVED: HB A1C (BAYER DCA - WAIVED): 6.1 % — ABNORMAL HIGH (ref 4.8–5.6)

## 2023-10-28 MED ORDER — LISINOPRIL 10 MG PO TABS: 10.0000 mg | ORAL_TABLET | Freq: Every day | ORAL | 3 refills | Status: DC

## 2023-10-28 NOTE — Progress Notes (Signed)
 BP 116/76   Pulse 69   Ht 6' (1.829 m)   Wt 210 lb (95.3 kg)   SpO2 96%   BMI 28.48 kg/m    Subjective:   Patient ID: Adam Oneal, male    DOB: 30-Jan-1961, 63 y.o.   MRN: [id number]  HPI: Adam Oneal is a 63 y.o. male presenting on 10/28/2023 for Medical Management of Chronic Issues, Diabetes, and Hyperlipidemia   HPI Type 2 diabetes mellitus Patient comes in today for recheck of his diabetes. Patient has been currently taking Jardiance and metformin. Patient is currently on an ACE inhibitor/ARB. Patient has not seen an ophthalmologist this year. Patient denies any new issues with their feet. The symptom started onset as an adult hypertension and hyperlipidemia ARE RELATED TO DM   Hypertension Patient is currently on lisinopril, and their blood pressure today is 116/76. Patient denies any lightheadedness or dizziness. Patient denies headaches, blurred vision, chest pains, shortness of breath, or weakness. Denies any side effects from medication and is content with current medication.   Hyperlipidemia Patient is coming in for recheck of his hyperlipidemia. The patient is currently taking no medication. They deny any issues with myalgias or history of liver damage from it. They deny any focal numbness or weakness or chest pain.   Relevant past medical, surgical, family and social history reviewed and updated as indicated. Interim medical history since our last visit reviewed. Allergies and medications reviewed and updated.  Review of Systems  Constitutional:  Negative for chills and fever.  Eyes:  Negative for visual disturbance.  Respiratory:  Negative for shortness of breath and wheezing.   Cardiovascular:  Negative for chest pain and leg swelling.  Musculoskeletal:  Negative for back pain and gait problem.  Skin:  Negative for rash.  Neurological:  Negative for dizziness and light-headedness.  All other systems reviewed and are negative.   Per HPI unless specifically  indicated above   Allergies as of 10/28/2023       Reactions   Lipitor [atorvastatin] Shortness Of Breath        Medication List        Accurate as of October 28, 2023  8:39 AM. If you have any questions, ask your nurse or doctor.          cyclobenzaprine 10 MG tablet Commonly known as: FLEXERIL Take 1 tablet (10 mg total) by mouth 3 (three) times daily as needed for muscle spasms.   DULoxetine 60 MG capsule Commonly known as: Cymbalta Take 1 capsule (60 mg total) by mouth daily.   empagliflozin 25 MG Tabs tablet Commonly known as: Jardiance Take 1 tablet (25 mg total) by mouth daily before breakfast.   fluticasone 50 MCG/ACT nasal spray Commonly known as: FLONASE USE 1 SPRAY IN EACH NOSTRIL DAILY   hydrOXYzine 25 MG tablet Commonly known as: ATARAX Take 1 tablet (25 mg total) by mouth every 8 (eight) hours as needed for anxiety.   lisinopril 10 MG tablet Commonly known as: ZESTRIL Take 1 tablet (10 mg total) by mouth daily.   lithium carbonate 450 MG ER tablet Commonly known as: ESKALITH Take 2 tablets (900 mg total) by mouth every morning.   metFORMIN 1000 MG tablet Commonly known as: GLUCOPHAGE Take 1 tablet (1,000 mg total) by mouth 2 (two) times daily with a meal.   tamsulosin 0.4 MG Caps capsule Commonly known as: FLOMAX Take 1 capsule (0.4 mg total) by mouth daily.  Objective:   BP 116/76   Pulse 69   Ht 6' (1.829 m)   Wt 210 lb (95.3 kg)   SpO2 96%   BMI 28.48 kg/m   Wt Readings from Last 3 Encounters:  10/28/23 210 lb (95.3 kg)  07/29/23 220 lb (99.8 kg)  04/10/23 213 lb (96.6 kg)    Physical Exam Vitals and nursing note reviewed.  Constitutional:      General: He is not in acute distress.    Appearance: He is well-developed. He is not diaphoretic.  Eyes:     General: No scleral icterus.    Conjunctiva/sclera: Conjunctivae normal.  Neck:     Thyroid: No thyromegaly.  Cardiovascular:     Rate and Rhythm: Normal rate  and regular rhythm.     Heart sounds: Normal heart sounds. No murmur heard. Pulmonary:     Effort: Pulmonary effort is normal. No respiratory distress.     Breath sounds: Normal breath sounds. No wheezing.  Musculoskeletal:        General: No swelling. Normal range of motion.     Cervical back: Neck supple.  Lymphadenopathy:     Cervical: No cervical adenopathy.  Skin:    General: Skin is warm and dry.     Findings: No rash.  Neurological:     Mental Status: He is alert and oriented to person, place, and time.     Coordination: Coordination normal.  Psychiatric:        Behavior: Behavior normal.       Assessment & Plan:   Problem List Items Addressed This Visit       Cardiovascular and Mediastinum   Hypertension associated with diabetes (HCC)   Relevant Medications   lisinopril (ZESTRIL) 10 MG tablet     Genitourinary   BPH (benign prostatic hyperplasia)   Other Visit Diagnoses       Type 2 diabetes mellitus without complication, without long-term current use of insulin (HCC)    -  Primary   Relevant Medications   lisinopril (ZESTRIL) 10 MG tablet   Other Relevant Orders   Bayer DCA Hb A1c Waived       A1c looks good at 6.1.  Patient is on Flomax but is having some straining with urination. Follow up plan: Return in about 3 months (around 01/27/2024), or if symptoms worsen or fail to improve, for Diabetes and hypertension.  Counseling provided for all of the vaccine components Orders Placed This Encounter  Procedures   Bayer DCA Hb A1c Waived    Arville Care, MD [facility] 10/28/2023, 8:39 AM

## 2023-11-25 ENCOUNTER — Telehealth (HOSPITAL_COMMUNITY): Admitting: Psychiatry

## 2023-11-25 ENCOUNTER — Encounter (HOSPITAL_COMMUNITY): Payer: Self-pay | Admitting: Psychiatry

## 2023-11-25 DIAGNOSIS — F3162 Bipolar disorder, current episode mixed, moderate: Secondary | ICD-10-CM

## 2023-11-25 MED ORDER — LITHIUM CARBONATE ER 450 MG PO TBCR: 900.0000 mg | EXTENDED_RELEASE_TABLET | ORAL | 3 refills | Status: DC

## 2023-11-25 MED ORDER — HYDROXYZINE HCL 25 MG PO TABS
25.0000 mg | ORAL_TABLET | Freq: Three times a day (TID) | ORAL | 2 refills | Status: DC | PRN
Start: 2023-11-25 — End: 2024-06-08

## 2023-11-25 MED ORDER — DULOXETINE HCL 60 MG PO CPEP: 60.0000 mg | ORAL_CAPSULE | Freq: Every day | ORAL | 2 refills | Status: DC

## 2023-11-25 NOTE — Progress Notes (Signed)
 Virtual Visit via Video Note  I connected with Adam Oneal on 11/25/23 at  9:40 AM EDT by a video enabled telemedicine application and verified that I am speaking with the correct person using two identifiers.  Location: Patient: home Provider: office   I discussed the limitations of evaluation and management by telemedicine and the availability of in person appointments. The patient expressed understanding and agreed to proceed.      I discussed the assessment and treatment plan with the patient. The patient was provided an opportunity to ask questions and all were answered. The patient agreed with the plan and demonstrated an understanding of the instructions.   The patient was advised to call back or seek an in-person evaluation if the symptoms worsen or if the condition fails to improve as anticipated.  I provided 20 minutes of non-face-to-face time during this encounter.   Alfredia Annas, MD  [facility] MD/PA/NP OP Progress Note  11/25/2023 9:50 AM Adam Oneal  MRN:  [id number]  Chief Complaint:  Chief Complaint  Patient presents with   Manic Behavior   Depression   Follow-up   HPI: This patient is a 63 year old widowed white male who lives alone in [place].  He is retired from the [employer].  The patient returns for follow-up after 3 months regarding his bipolar disorder.  He states that again his mood has been stable.  He denies significant depression or manic symptoms.  Most of the time he is sleeping well.  He denies significant anxiety.  He denies racing thoughts hyperactivity or impulsivity.  He is trying to stay busy walking and doing yard work.  He seems to be in good spirits. Visit Diagnosis:    ICD-10-CM   1. Bipolar 1 disorder, mixed, moderate (HCC)  F31.62       Past Psychiatric History: Long-term outpatient treatment for bipolar disorder  Past Medical History:  Past Medical History:  Diagnosis Date   Acid reflux    Anxiety 01/1998   Cataract  02/2004   Depression 01/1998   Diabetes mellitus without complication (HCC) 12/1999   Diabetes mellitus, type II (HCC)    Hyperlipidemia 11/2006   Hypertension    Hypertension    History reviewed. No pertinent surgical history.  Family Psychiatric History: See below  Family History:  Family History  Problem Relation Age of Onset   Cancer Mother 15       lung   Heart disease Father    Hyperlipidemia Father    Diabetes Brother    Leukemia Brother    Diabetes Paternal Grandmother    Heart disease Paternal Grandmother    Alcohol abuse Paternal Grandfather    Diabetes Brother    Depression Maternal Aunt     Social History:  Social History   Socioeconomic History   Marital status: Widowed    Spouse name: Not on file   Number of children: Not on file   Years of education: Not on file   Highest education level: Associate degree: occupational, Scientist, product/process development, or vocational program  Occupational History   Not on file  Tobacco Use   Smoking status: Former    Current packs/day: 0.00    Average packs/day: 1 pack/day for 12.0 years (12.0 ttl pk-yrs)    Types: Cigarettes    Start date: 01/05/1978    Quit date: 12/28/1989    Years since quitting: 33.9   Smokeless tobacco: Former  Building services engineer status: Never Used  Substance and Sexual Activity  Alcohol use: No    Alcohol/week: 0.0 standard drinks of alcohol    Comment: 03-26-16 per pt no   Drug use: No    Comment: 03-26-16 per pt currently no but use to   Sexual activity: Never  Other Topics Concern   Not on file  Social History Narrative   Not on file   Social Drivers of Health   Financial Resource Strain: Low Risk  (07/24/2023)   Overall Financial Resource Strain (CARDIA)    Difficulty of Paying Living Expenses: Not hard at all  Food Insecurity: No Food Insecurity (07/24/2023)   Hunger Vital Sign    Worried About Running Out of Food in the Last Year: Never true    Ran Out of Food in the Last Year: Never true   Transportation Needs: Unmet Transportation Needs (07/24/2023)   PRAPARE - Administrator, Civil Service (Medical): Yes    Lack of Transportation (Non-Medical): No  Physical Activity: Insufficiently Active (07/24/2023)   Exercise Vital Sign    Days of Exercise per Week: 2 days    Minutes of Exercise per Session: 30 min  Stress: No Stress Concern Present (07/24/2023)   [employer] of Occupational Health - Occupational Stress Questionnaire    Feeling of Stress : Not at all  Social Connections: Socially Isolated (07/24/2023)   Social Connection and Isolation Panel [NHANES]    Frequency of Communication with Friends and Family: Once a week    Frequency of Social Gatherings with Friends and Family: Never    Attends Religious Services: Never    Database administrator or Organizations: No    Attends Engineer, structural: Not on file    Marital Status: Widowed    Allergies:  Allergies  Allergen Reactions   Lipitor [Atorvastatin ] Shortness Of Breath    Metabolic Disorder Labs: Lab Results  Component Value Date   HGBA1C 6.1 (H) 10/28/2023   No results found for: "PROLACTIN" Lab Results  Component Value Date   CHOL 165 07/29/2023   TRIG 183 (H) 07/29/2023   HDL 40 07/29/2023   CHOLHDL 4.1 07/29/2023   LDLCALC 93 07/29/2023   LDLCALC 92 01/03/2023   Lab Results  Component Value Date   TSH 1.860 01/03/2023   TSH 1.580 11/07/2020    Therapeutic Level Labs: Lab Results  Component Value Date   LITHIUM  0.8 01/03/2023   LITHIUM  0.4 (L) 07/19/2022   No results found for: "VALPROATE" No results found for: "CBMZ"  Current Medications: Current Outpatient Medications  Medication Sig Dispense Refill   cyclobenzaprine  (FLEXERIL ) 10 MG tablet Take 1 tablet (10 mg total) by mouth 3 (three) times daily as needed for muscle spasms. 270 tablet 3   DULoxetine  (CYMBALTA ) 60 MG capsule Take 1 capsule (60 mg total) by mouth daily. 90 capsule 2   empagliflozin   (JARDIANCE ) 25 MG TABS tablet Take 1 tablet (25 mg total) by mouth daily before breakfast. 90 tablet 3   fluticasone  (FLONASE ) 50 MCG/ACT nasal spray USE 1 SPRAY IN EACH NOSTRIL DAILY 48 g 1   hydrOXYzine  (ATARAX ) 25 MG tablet Take 1 tablet (25 mg total) by mouth every 8 (eight) hours as needed for anxiety. 90 tablet 2   lisinopril  (ZESTRIL ) 10 MG tablet Take 1 tablet (10 mg total) by mouth daily. 90 tablet 3   lithium  carbonate (ESKALITH ) 450 MG ER tablet Take 2 tablets (900 mg total) by mouth every morning. 180 tablet 3   metFORMIN  (GLUCOPHAGE ) 1000 MG tablet Take 1  tablet (1,000 mg total) by mouth 2 (two) times daily with a meal. 180 tablet 3   tamsulosin  (FLOMAX ) 0.4 MG CAPS capsule Take 1 capsule (0.4 mg total) by mouth daily. 90 capsule 3   No current facility-administered medications for this visit.     Musculoskeletal: Strength & Muscle Tone: within normal limits Gait & Station: normal Patient leans: N/A  Psychiatric Specialty Exam: Review of Systems  All other systems reviewed and are negative.   There were no vitals taken for this visit.There is no height or weight on file to calculate BMI.  General Appearance: Casual and Fairly Groomed  Eye Contact:  Good  Speech:  Clear and Coherent  Volume:  Normal  Mood:  Euthymic  Affect:  Congruent  Thought Process:  Goal Directed  Orientation:  Full (Time, Place, and Person)  Thought Content: WDL   Suicidal Thoughts:  No  Homicidal Thoughts:  No  Memory:  Immediate;   Good Recent;   Good Remote;   NA  Judgement:  Good  Insight:  Fair  Psychomotor Activity:  Normal  Concentration:  Concentration: Good and Attention Span: Good  Recall:  Good  Fund of Knowledge: Good  Language: Good  Akathisia:  No  Handed:  Right  AIMS (if indicated): not done  Assets:  Communication Skills Desire for Improvement Physical Health Resilience Social Support Talents/Skills  ADL's:  Intact  Cognition: WNL  Sleep:  Good    Screenings: AUDIT    Flowsheet Row Office Visit from 01/03/2023 in [facility]  Alcohol Use Disorder Identification Test Final Score (AUDIT) 6      GAD-7    Flowsheet Row Office Visit from 10/28/2023 in [facility] Office Visit from 07/29/2023 in [facility] Office Visit from 04/10/2023 in [facility] Office Visit from 01/03/2023 in [facility] Office Visit from 07/19/2022 in [facility]  Total GAD-7 Score 0 3 0 0 0      PHQ2-9    Flowsheet Row Office Visit from 10/28/2023 in [facility] Office Visit from 07/29/2023 in [facility] Office Visit from 04/10/2023 in [facility] Office Visit from 01/03/2023 in [facility] Office Visit from 07/19/2022 in [facility]  PHQ-2 Total Score 0 2 0 0 0  PHQ-9 Total Score 0 5 0 0 --      Flowsheet Row Video Visit from 02/21/2022 in [facility] [place] Video Visit from 10/26/2021 in [facility] [place] Video Visit from 06/02/2021 in [facility] [place]  C-SSRS RISK CATEGORY No Risk No Risk No Risk        Assessment and Plan: This patient is a 63 year old male with a history of bipolar disorder.  He continues to do well on his current regimen.  He will continue lithium  carbonate 900 mg daily for mood stabilization, Cymbalta  60 mg daily for depression and hydroxyzine  25 mg daily as needed for sleep or anxiety.  He will return to see me in 6 months at his request.  His PCP continues to check his labs frequently.  Collaboration of Care: Collaboration of Care: Primary Care Provider  AEB notes are shared with PCP on the epic system  Patient/Guardian was advised Release of Information  must be obtained prior to any record release in order to collaborate their care with an outside provider. Patient/Guardian was advised if they have not already done so to contact the registration department to sign all necessary forms in order for us  to release information regarding their care.   Consent: Patient/Guardian gives verbal consent for treatment and assignment of benefits for services provided during this visit. Patient/Guardian expressed understanding and agreed to proceed.    Alfredia Annas, MD 11/25/2023, 9:50 AM

## 2024-01-30 ENCOUNTER — Encounter: Payer: Self-pay | Admitting: Family Medicine

## 2024-01-30 ENCOUNTER — Ambulatory Visit: Admitting: Family Medicine

## 2024-01-30 VITALS — BP 139/86 | HR 82 | Ht 72.0 in | Wt 215.0 lb

## 2024-01-30 DIAGNOSIS — N401 Enlarged prostate with lower urinary tract symptoms: Secondary | ICD-10-CM | POA: Diagnosis not present

## 2024-01-30 DIAGNOSIS — E118 Type 2 diabetes mellitus with unspecified complications: Secondary | ICD-10-CM

## 2024-01-30 DIAGNOSIS — E1169 Type 2 diabetes mellitus with other specified complication: Secondary | ICD-10-CM

## 2024-01-30 DIAGNOSIS — E1159 Type 2 diabetes mellitus with other circulatory complications: Secondary | ICD-10-CM | POA: Diagnosis not present

## 2024-01-30 DIAGNOSIS — R3916 Straining to void: Secondary | ICD-10-CM

## 2024-01-30 DIAGNOSIS — F3162 Bipolar disorder, current episode mixed, moderate: Secondary | ICD-10-CM | POA: Diagnosis not present

## 2024-01-30 DIAGNOSIS — Z7984 Long term (current) use of oral hypoglycemic drugs: Secondary | ICD-10-CM

## 2024-01-30 DIAGNOSIS — I152 Hypertension secondary to endocrine disorders: Secondary | ICD-10-CM

## 2024-01-30 DIAGNOSIS — E119 Type 2 diabetes mellitus without complications: Secondary | ICD-10-CM

## 2024-01-30 DIAGNOSIS — E785 Hyperlipidemia, unspecified: Secondary | ICD-10-CM

## 2024-01-30 LAB — LIPID PANEL

## 2024-01-30 NOTE — Progress Notes (Signed)
   Established Patient Office Visit  Subjective   Patient ID: Adam Oneal, male    DOB: [birth year]  Age: 63 y.o. MRN: [id number]   HPI (1) Diabetes Mellitus The patient is present for a routine follow-up for type 2 diabetes mellitus. Patient report overall stable blood glucose control, but he does not regularly check his blood glucose levels. Patient deny any recent symptoms of polyuria, polydipsia, polyphagia, fatigue, or unexplained weight loss. No episodes of hypoglycemia or hyperglycemia requiring emergency care have occurred recently. Last hemoglobin A1C on 10/28/23 was 6.2. The patient reports good adherence to prescribed medications, which include Jardiance  and Metformin . Patient is aware about monitoring their carbohydrate intake and report consistent adherence to following a diabetic-friendly die. Physical activity includes walking his dog and lifting weights at the gym. No new diabetic complications such as foot ulcers, neuropathy, or vision changes reported today. No new questions or concerns.    Review of Systems  Constitutional:  Negative for chills, fever, malaise/fatigue and weight loss.  HENT:  Negative for congestion, hearing loss and sore throat.   Eyes:  Negative for blurred vision.  Respiratory:  Negative for cough and shortness of breath.   Cardiovascular:  Negative for chest pain and leg swelling.  Gastrointestinal:  Negative for abdominal pain, constipation, diarrhea and nausea.  Genitourinary:  Negative for dysuria and urgency.  Musculoskeletal:  Negative for joint pain and myalgias.  Skin:  Negative for itching and rash.  Neurological:  Negative for dizziness, tingling, focal weakness and headaches.      Objective:     BP 139/86   Pulse 82   Ht 6' (1.829 m)   Wt 215 lb (97.5 kg)   SpO2 96%   BMI 29.16 kg/m    Physical Exam Constitutional:      General: He is not in acute distress.    Appearance: Normal appearance. He is normal weight.   Cardiovascular:     Rate and Rhythm: Normal rate and regular rhythm.     Pulses: Normal pulses.     Heart sounds: Normal heart sounds. No murmur heard. Pulmonary:     Effort: Pulmonary effort is normal. No respiratory distress.     Breath sounds: Normal breath sounds.  Neurological:     Mental Status: He is alert and oriented to person, place, and time.  Psychiatric:        Mood and Affect: Mood normal.        Behavior: Behavior normal.      No results found for any visits on 01/30/24.   (1) A1C - 6.5  The 10-year ASCVD risk score (Arnett DK, et al., 2019) is: 27.9%    Assessment & Plan:   (1) T2DM - Assessment: Well controlled with medications, dietary modifications, and physical activity.  - Plan: Continue taking Jardiance  and Metformin  as prescribed.   RTC in 3 months for a routine follow-up or sooner if symptoms worsen.  Dotty Blanch, Medical Student  [employer]  at [facility] 01/30/24 4:13 PM   I was personally present for all components of the history, physical exam and/or medical decision making.  I agree with the documentation performed by the student and agree with assessment and plan above.  Fonda Levins, MD [facility] 02/12/2024, 12:44 PM

## 2024-01-31 LAB — CBC WITH DIFFERENTIAL/PLATELET
Basophils Absolute: 0.1 x10E3/uL (ref 0.0–0.2)
Basos: 0 %
EOS (ABSOLUTE): 0.3 x10E3/uL (ref 0.0–0.4)
Eos: 2 %
Hematocrit: 51.7 % — ABNORMAL HIGH (ref 37.5–51.0)
Hemoglobin: 16.6 g/dL (ref 13.0–17.7)
Immature Grans (Abs): 0 x10E3/uL (ref 0.0–0.1)
Immature Granulocytes: 0 %
Lymphocytes Absolute: 4.9 x10E3/uL — ABNORMAL HIGH (ref 0.7–3.1)
Lymphs: 36 %
MCH: 30.1 pg (ref 26.6–33.0)
MCHC: 32.1 g/dL (ref 31.5–35.7)
MCV: 94 fL (ref 79–97)
Monocytes Absolute: 1.8 x10E3/uL — ABNORMAL HIGH (ref 0.1–0.9)
Monocytes: 13 %
Neutrophils Absolute: 6.8 x10E3/uL (ref 1.4–7.0)
Neutrophils: 49 %
Platelets: 427 x10E3/uL (ref 150–450)
RBC: 5.52 x10E6/uL (ref 4.14–5.80)
RDW: 13 % (ref 11.6–15.4)
WBC: 13.8 x10E3/uL — ABNORMAL HIGH (ref 3.4–10.8)

## 2024-01-31 LAB — CMP14+EGFR
ALT: 19 IU/L (ref 0–44)
AST: 15 IU/L (ref 0–40)
Albumin: 4.5 g/dL (ref 3.9–4.9)
Alkaline Phosphatase: 86 IU/L (ref 44–121)
BUN/Creatinine Ratio: 15 (ref 10–24)
BUN: 13 mg/dL (ref 8–27)
Bilirubin Total: <0.2 mg/dL (ref 0.0–1.2)
CO2: 19 mmol/L — ABNORMAL LOW (ref 20–29)
Calcium: 9.9 mg/dL (ref 8.6–10.2)
Chloride: 103 mmol/L (ref 96–106)
Creatinine, Ser: 0.84 mg/dL (ref 0.76–1.27)
Globulin, Total: 2.6 g/dL (ref 1.5–4.5)
Glucose: 111 mg/dL — ABNORMAL HIGH (ref 70–99)
Potassium: 5 mmol/L (ref 3.5–5.2)
Sodium: 139 mmol/L (ref 134–144)
Total Protein: 7.1 g/dL (ref 6.0–8.5)
eGFR: 97 mL/min/1.73 (ref >59)

## 2024-01-31 LAB — LIPID PANEL
Chol/HDL Ratio: 2.7 ratio (ref 0.0–5.0)
Cholesterol, Total: 163 mg/dL (ref 100–199)
HDL: 60 mg/dL (ref >39)
LDL Chol Calc (NIH): 81 mg/dL (ref 0–99)
Triglycerides: 123 mg/dL (ref 0–149)
VLDL Cholesterol Cal: 22 mg/dL (ref 5–40)

## 2024-01-31 LAB — LITHIUM LEVEL: Lithium Lvl: 0.6 mmol/L (ref 0.5–1.2)

## 2024-01-31 LAB — PSA, TOTAL AND FREE
PSA, Free Pct: 11.4 %
PSA, Free: 0.08 ng/mL
Prostate Specific Ag, Serum: 0.7 ng/mL (ref 0.0–4.0)

## 2024-02-03 ENCOUNTER — Ambulatory Visit: Payer: Self-pay | Admitting: Family Medicine

## 2024-02-03 LAB — BAYER DCA HB A1C WAIVED: HB A1C (BAYER DCA - WAIVED): 6.5 % — ABNORMAL HIGH (ref 4.8–5.6)

## 2024-02-27 ENCOUNTER — Other Ambulatory Visit: Payer: Self-pay | Admitting: Family Medicine

## 2024-02-27 DIAGNOSIS — E119 Type 2 diabetes mellitus without complications: Secondary | ICD-10-CM

## 2024-02-27 DIAGNOSIS — E118 Type 2 diabetes mellitus with unspecified complications: Secondary | ICD-10-CM

## 2024-05-01 ENCOUNTER — Ambulatory Visit: Payer: Self-pay | Admitting: Family Medicine

## 2024-05-01 ENCOUNTER — Ambulatory Visit: Admitting: Family Medicine

## 2024-05-01 ENCOUNTER — Ambulatory Visit (INDEPENDENT_AMBULATORY_CARE_PROVIDER_SITE_OTHER)

## 2024-05-01 ENCOUNTER — Encounter: Payer: Self-pay | Admitting: Family Medicine

## 2024-05-01 ENCOUNTER — Ambulatory Visit: Payer: Self-pay | Admitting: *Deleted

## 2024-05-01 VITALS — BP 149/94 | HR 89 | Temp 97.3°F | Ht 72.0 in | Wt 218.0 lb

## 2024-05-01 DIAGNOSIS — E785 Hyperlipidemia, unspecified: Secondary | ICD-10-CM

## 2024-05-01 DIAGNOSIS — E118 Type 2 diabetes mellitus with unspecified complications: Secondary | ICD-10-CM

## 2024-05-01 DIAGNOSIS — R1013 Epigastric pain: Secondary | ICD-10-CM

## 2024-05-01 DIAGNOSIS — I152 Hypertension secondary to endocrine disorders: Secondary | ICD-10-CM

## 2024-05-01 DIAGNOSIS — E1159 Type 2 diabetes mellitus with other circulatory complications: Secondary | ICD-10-CM

## 2024-05-01 DIAGNOSIS — Z7984 Long term (current) use of oral hypoglycemic drugs: Secondary | ICD-10-CM

## 2024-05-01 DIAGNOSIS — R5383 Other fatigue: Secondary | ICD-10-CM

## 2024-05-01 DIAGNOSIS — E119 Type 2 diabetes mellitus without complications: Secondary | ICD-10-CM

## 2024-05-01 DIAGNOSIS — E1169 Type 2 diabetes mellitus with other specified complication: Secondary | ICD-10-CM | POA: Diagnosis not present

## 2024-05-01 DIAGNOSIS — N400 Enlarged prostate without lower urinary tract symptoms: Secondary | ICD-10-CM

## 2024-05-01 LAB — LIPID PANEL

## 2024-05-01 LAB — BAYER DCA HB A1C WAIVED: HB A1C (BAYER DCA - WAIVED): 6.8 % — ABNORMAL HIGH (ref 4.8–5.6)

## 2024-05-01 MED ORDER — LISINOPRIL 10 MG PO TABS: 10.0000 mg | ORAL_TABLET | Freq: Every day | ORAL | 3 refills | Status: AC

## 2024-05-01 MED ORDER — CYCLOBENZAPRINE HCL 10 MG PO TABS: 10.0000 mg | ORAL_TABLET | Freq: Three times a day (TID) | ORAL | 3 refills | Status: AC | PRN

## 2024-05-01 MED ORDER — TAMSULOSIN HCL 0.4 MG PO CAPS: 0.4000 mg | ORAL_CAPSULE | Freq: Every day | ORAL | 3 refills | Status: AC

## 2024-05-01 MED ORDER — FREESTYLE LIBRE 3 PLUS SENSOR MISC: 11 refills | Status: AC

## 2024-05-01 MED ORDER — FREESTYLE LIBRE 3 READER DEVI: 1.0000 | Freq: Two times a day (BID) | 1 refills | Status: AC

## 2024-05-01 MED ORDER — METFORMIN HCL 1000 MG PO TABS
1000.0000 mg | ORAL_TABLET | Freq: Two times a day (BID) | ORAL | 3 refills | Status: AC
Start: 2024-05-01 — End: ?

## 2024-05-01 MED ORDER — EMPAGLIFLOZIN 25 MG PO TABS: 25.0000 mg | ORAL_TABLET | Freq: Every day | ORAL | 3 refills | Status: AC

## 2024-05-01 NOTE — Telephone Encounter (Signed)
 FYI Only or Action Required?: FYI only for provider.  Patient was last seen in primary care on 01/30/2024 by Dettinger, Fonda LABOR, MD.  Called Nurse Triage reporting Vomiting.  Symptoms began a week ago.  Interventions attempted: Nothing.  Symptoms are: gradually worsening.  Triage Disposition: See HCP Within 4 Hours (Or PCP Triage)  Patient/caregiver understands and will follow disposition?: yes   Reason for Disposition  [1] Constant abdominal pain AND [2] present > 2 hours  Answer Assessment - Initial Assessment Questions Patient states he has had multiple symptoms- starting with cough, SOB, now fatigue vomiting, diarrhea(today)- has had out of town visit from family member recently -they had cough. Suspect for COVID- patient advised mask at visit.   1. VOMITING SEVERITY: How many times have you vomited in the past 24 hours?      3 times,  2. ONSET: When did the vomiting begin?      today 3. FLUIDS: What fluids or food have you vomited up today? Have you been able to keep any fluids down?     Unable to keep food down 4. ABDOMEN PAIN: Are your having any abdomen pain? If Yes : How bad is it and what does it feel like? (e.g., crampy, dull, intermittent, constant)      Yes- tightness- upper abdomen 5. DIARRHEA: Is there any diarrhea? If Yes, ask: How many times today?      Yes- this morning 6. CONTACTS: Is there anyone else in the family with the same symptoms?      Sister was visiting - had cough 7. CAUSE: What do you think is causing your vomiting?     Unsure- recent illness with cough, fever, chills  9. OTHER SYMPTOMS: Do you have any other symptoms? (e.g., fever, headache, vertigo, vomiting blood or coffee grounds, recent head injury)     chills  Protocols used: Vomiting-A-AH   Copied from CRM #[id number]. Topic: Clinical - Red Word Triage >> May 01, 2024  9:42 AM Miquel SAILOR wrote: Red Word that prompted transfer to Nurse Triage: Patient  Vomitting/Coughing/Stomach pain/fever/Shortmness off breath for 1 week getting worse

## 2024-05-01 NOTE — Progress Notes (Signed)
 BP (!) 149/94   Pulse 89   Temp (!) 97.3 F (36.3 C) (Temporal)   Ht 6' (1.829 m)   Wt 218 lb (98.9 kg)   SpO2 97%   BMI 29.57 kg/m    Subjective:   Patient ID: Adam Oneal, male    DOB: March 09, 1961, 63 y.o.   MRN: [id number]  HPI: Adam Oneal is a 63 y.o. male presenting on 05/01/2024 for Medical Management of Chronic Issues   Discussed the use of AI scribe software for clinical note transcription with the patient, who gave verbal consent to proceed.  History of Present Illness   Adam Oneal is a 63 year old male with diabetes who presents with nausea, vomiting, and diarrhea.  Gastrointestinal symptoms - Nausea and vomiting for the past couple of days - Preceding stomach discomfort for approximately three weeks - Initial constipation during the first week, transitioned to diarrhea - Diarrhea is improving - Sensation of feeling 'hollow' - Bloating, particularly in the upper abdomen - No current constipation - Lungs feel 'raw' due to vomiting and coughing  Recent infectious exposure - Sister, who was coughing frequently, visited from [place] around the onset of symptoms  Glycemic control - Diabetes with most recent A1c of 6.8, slightly increased from previous measurement but remains below 7 - Increase in A1c attributed to increased pizza consumption due to rising grocery costs  Sleep disturbance and anxiety - Difficulty sleeping attributed to stress related to family issues - Significant anxiety due to the death of his brother and advanced illness of his older brother with leukemia for nearly 20 years  Blood pressure concerns - Believes blood pressure is elevated secondary to anxiety  Mood and social support - Mood affected by seasonal changes - Maintains contact with friends - Involved in supporting a friend who was previously blacklisted from the clinic          Relevant past medical, surgical, family and social history reviewed and updated as  indicated. Interim medical history since our last visit reviewed. Allergies and medications reviewed and updated.  Review of Systems  Constitutional:  Negative for chills and fever.  Respiratory:  Negative for shortness of breath and wheezing.   Cardiovascular:  Negative for chest pain and leg swelling.  Gastrointestinal:  Positive for abdominal distention, abdominal pain, constipation, diarrhea, nausea and vomiting. Negative for blood in stool.  Musculoskeletal:  Negative for back pain and gait problem.  Skin:  Negative for rash.  All other systems reviewed and are negative.   Per HPI unless specifically indicated above   Allergies as of 05/01/2024       Reactions   Lipitor [atorvastatin ] Shortness Of Breath        Medication List        Accurate as of May 01, 2024  2:26 PM. If you have any questions, ask your nurse or doctor.          cyclobenzaprine  10 MG tablet Commonly known as: FLEXERIL  Take 1 tablet (10 mg total) by mouth 3 (three) times daily as needed for muscle spasms.   DULoxetine  60 MG capsule Commonly known as: Cymbalta  Take 1 capsule (60 mg total) by mouth daily.   empagliflozin  25 MG Tabs tablet Commonly known as: Jardiance  Take 1 tablet (25 mg total) by mouth daily before breakfast.   fluticasone  50 MCG/ACT nasal spray Commonly known as: FLONASE  USE 1 SPRAY IN EACH NOSTRIL DAILY   FreeStyle Libre 3 Plus Sensor Misc Change sensor every  15 days. Started by: Fonda LABOR Malaya Cagley   FreeStyle Libre 3 Reader Devi 1 each by Does not apply route 2 (two) times daily. Started by: Fonda LABOR Sweetie Giebler   hydrOXYzine  25 MG tablet Commonly known as: ATARAX  Take 1 tablet (25 mg total) by mouth every 8 (eight) hours as needed for anxiety.   lisinopril  10 MG tablet Commonly known as: ZESTRIL  Take 1 tablet (10 mg total) by mouth daily.   lithium  carbonate 450 MG ER tablet Commonly known as: ESKALITH  Take 2 tablets (900 mg total) by mouth every  morning.   metFORMIN  1000 MG tablet Commonly known as: GLUCOPHAGE  Take 1 tablet (1,000 mg total) by mouth 2 (two) times daily with a meal.   tamsulosin  0.4 MG Caps capsule Commonly known as: FLOMAX  Take 1 capsule (0.4 mg total) by mouth daily.         Objective:   BP (!) 149/94   Pulse 89   Temp (!) 97.3 F (36.3 C) (Temporal)   Ht 6' (1.829 m)   Wt 218 lb (98.9 kg)   SpO2 97%   BMI 29.57 kg/m   Wt Readings from Last 3 Encounters:  05/01/24 218 lb (98.9 kg)  01/30/24 215 lb (97.5 kg)  10/28/23 210 lb (95.3 kg)    Physical Exam Physical Exam   CHEST: Lungs clear to auscultation bilaterally. CARDIOVASCULAR: Heart regular rate and rhythm. ABDOMEN: Abdomen gaseous and bloated, especially in the upper part.        KUB: No abnormal bowel gas pattern, await final read from radiology. Assessment & Plan:   Problem List Items Addressed This Visit       Cardiovascular and Mediastinum   Hypertension associated with diabetes (HCC)   Relevant Medications   empagliflozin  (JARDIANCE ) 25 MG TABS tablet   lisinopril  (ZESTRIL ) 10 MG tablet   metFORMIN  (GLUCOPHAGE ) 1000 MG tablet   Other Relevant Orders   CMP14+EGFR     Endocrine   Hyperlipidemia associated with type 2 diabetes mellitus (HCC)   Relevant Medications   empagliflozin  (JARDIANCE ) 25 MG TABS tablet   lisinopril  (ZESTRIL ) 10 MG tablet   metFORMIN  (GLUCOPHAGE ) 1000 MG tablet   Other Relevant Orders   Lipid panel   Controlled type 2 diabetes mellitus with complication, without long-term current use of insulin (HCC) - Primary   Relevant Medications   empagliflozin  (JARDIANCE ) 25 MG TABS tablet   lisinopril  (ZESTRIL ) 10 MG tablet   metFORMIN  (GLUCOPHAGE ) 1000 MG tablet   Continuous Glucose Sensor (FREESTYLE LIBRE 3 PLUS SENSOR) MISC   Continuous Glucose Receiver (FREESTYLE LIBRE 3 READER) DEVI   Other Relevant Orders   CBC with Differential/Platelet   Bayer DCA Hb A1c Waived   Vitamin B12   Microalbumin /  creatinine urine ratio     Genitourinary   BPH (benign prostatic hyperplasia)   Relevant Medications   tamsulosin  (FLOMAX ) 0.4 MG CAPS capsule   Other Visit Diagnoses       Other fatigue       Relevant Orders   TSH     Type 2 diabetes mellitus without complication, without long-term current use of insulin (HCC)       Relevant Medications   empagliflozin  (JARDIANCE ) 25 MG TABS tablet   lisinopril  (ZESTRIL ) 10 MG tablet   metFORMIN  (GLUCOPHAGE ) 1000 MG tablet     Epigastric pain       Relevant Orders   DG Abd 1 View           Gastrointestinal symptoms (nausea, vomiting,  diarrhea, bloating) Symptoms improving. - Order abdominal x-ray to evaluate underlying issues. - X-ray looks normal, likely issues from a viral gastroenteritis that he is still recovering from, recommended that he continue Pepcid AC and increase his fiber and increase his hydration and could use a laxative if he is constipated.  Type 2 diabetes mellitus A1c at 6.8, slightly elevated but under target. Dietary habits may contribute. - Advise reducing carbohydrate intake, especially pizza. - Incorporate more fruits and vegetables into diet.  Hypertension Elevated blood pressure likely due to anxiety and stress. - Monitor blood pressure at home regularly.      Bipolar, sees psychiatry, continue to see them.    Follow up plan: Return in about 3 months (around 08/01/2024), or if symptoms worsen or fail to improve, for Diabetes recheck.  Counseling provided for all of the vaccine components Orders Placed This Encounter  Procedures   DG Abd 1 View   CBC with Differential/Platelet   CMP14+EGFR   Lipid panel   Bayer DCA Hb A1c Waived   Vitamin B12   Microalbumin / creatinine urine ratio   TSH    Fonda Levins, MD [facility] 05/01/2024, 2:26 PM

## 2024-05-01 NOTE — Telephone Encounter (Signed)
 Appt made.

## 2024-05-02 LAB — CMP14+EGFR
ALT: 14 IU/L (ref 0–44)
AST: 15 IU/L (ref 0–40)
Albumin: 4.6 g/dL (ref 3.9–4.9)
Alkaline Phosphatase: 111 IU/L (ref 47–123)
BUN/Creatinine Ratio: 14 (ref 10–24)
BUN: 12 mg/dL (ref 8–27)
Bilirubin Total: 0.5 mg/dL (ref 0.0–1.2)
CO2: 20 mmol/L (ref 20–29)
Calcium: 10.3 mg/dL — ABNORMAL HIGH (ref 8.6–10.2)
Chloride: 99 mmol/L (ref 96–106)
Creatinine, Ser: 0.85 mg/dL (ref 0.76–1.27)
Globulin, Total: 2.9 g/dL (ref 1.5–4.5)
Glucose: 125 mg/dL — ABNORMAL HIGH (ref 70–99)
Potassium: 4.8 mmol/L (ref 3.5–5.2)
Sodium: 140 mmol/L (ref 134–144)
Total Protein: 7.5 g/dL (ref 6.0–8.5)
eGFR: 96 mL/min/1.73 (ref >59)

## 2024-05-02 LAB — CBC WITH DIFFERENTIAL/PLATELET
Basophils Absolute: 0 x10E3/uL (ref 0.0–0.2)
Basos: 0 %
EOS (ABSOLUTE): 0.1 x10E3/uL (ref 0.0–0.4)
Eos: 1 %
Hematocrit: 53.7 % — ABNORMAL HIGH (ref 37.5–51.0)
Hemoglobin: 17.4 g/dL (ref 13.0–17.7)
Immature Grans (Abs): 0 x10E3/uL (ref 0.0–0.1)
Immature Granulocytes: 0 %
Lymphocytes Absolute: 3.6 x10E3/uL — ABNORMAL HIGH (ref 0.7–3.1)
Lymphs: 26 %
MCH: 30.4 pg (ref 26.6–33.0)
MCHC: 32.4 g/dL (ref 31.5–35.7)
MCV: 94 fL (ref 79–97)
Monocytes Absolute: 1.6 x10E3/uL — ABNORMAL HIGH (ref 0.1–0.9)
Monocytes: 11 %
Neutrophils Absolute: 8.4 x10E3/uL — ABNORMAL HIGH (ref 1.4–7.0)
Neutrophils: 62 %
Platelets: 409 x10E3/uL (ref 150–450)
RBC: 5.73 x10E6/uL (ref 4.14–5.80)
RDW: 12.9 % (ref 11.6–15.4)
WBC: 13.8 x10E3/uL — ABNORMAL HIGH (ref 3.4–10.8)

## 2024-05-02 LAB — LIPID PANEL
Chol/HDL Ratio: 2.5 ratio (ref 0.0–5.0)
Cholesterol, Total: 180 mg/dL (ref 100–199)
HDL: 73 mg/dL (ref >39)
LDL Chol Calc (NIH): 83 mg/dL (ref 0–99)
Triglycerides: 139 mg/dL (ref 0–149)
VLDL Cholesterol Cal: 24 mg/dL (ref 5–40)

## 2024-05-02 LAB — MICROALBUMIN / CREATININE URINE RATIO
Creatinine, Urine: 28.8 mg/dL
Microalb/Creat Ratio: 13 mg/g{creat} (ref 0–29)
Microalbumin, Urine: 3.6 ug/mL

## 2024-05-02 LAB — VITAMIN B12: Vitamin B-12: 600 pg/mL (ref 232–1245)

## 2024-05-02 LAB — TSH: TSH: 2.74 u[IU]/mL (ref 0.450–4.500)

## 2024-05-04 ENCOUNTER — Telehealth: Payer: Self-pay

## 2024-05-04 NOTE — Telephone Encounter (Signed)
 Pharmacy Patient Advocate Encounter    Received PA request form. Insurance will only cover CGM systems if patient is on basal or prandial insulin. Patient is not on insulin. PA not submitted. PA form attached to charts.

## 2024-05-04 NOTE — Telephone Encounter (Signed)
 Pt made aware and understood

## 2024-05-04 NOTE — Telephone Encounter (Signed)
 Please let him know that his insurance will only cover the CGM or freestyle libre if he is on insulin.  He can see what the cash price is if he wants to

## 2024-05-07 ENCOUNTER — Ambulatory Visit: Payer: Self-pay | Admitting: Family Medicine

## 2024-05-26 ENCOUNTER — Encounter (HOSPITAL_COMMUNITY): Payer: Self-pay

## 2024-05-26 ENCOUNTER — Telehealth (HOSPITAL_COMMUNITY): Admitting: Psychiatry

## 2024-06-08 ENCOUNTER — Ambulatory Visit (INDEPENDENT_AMBULATORY_CARE_PROVIDER_SITE_OTHER): Admitting: Psychiatry

## 2024-06-08 ENCOUNTER — Encounter (HOSPITAL_COMMUNITY): Payer: Self-pay | Admitting: Psychiatry

## 2024-06-08 VITALS — BP 147/91 | HR 74 | Ht 72.0 in | Wt 221.8 lb

## 2024-06-08 DIAGNOSIS — F3162 Bipolar disorder, current episode mixed, moderate: Secondary | ICD-10-CM

## 2024-06-08 MED ORDER — HYDROXYZINE HCL 25 MG PO TABS: 25.0000 mg | ORAL_TABLET | Freq: Three times a day (TID) | ORAL | 2 refills | Status: AC | PRN

## 2024-06-08 MED ORDER — LITHIUM CARBONATE ER 450 MG PO TBCR: 900.0000 mg | EXTENDED_RELEASE_TABLET | ORAL | 3 refills | Status: DC

## 2024-06-08 MED ORDER — DULOXETINE HCL 60 MG PO CPEP: 60.0000 mg | ORAL_CAPSULE | Freq: Every day | ORAL | 2 refills | Status: AC

## 2024-06-08 NOTE — Progress Notes (Signed)
 BH MD/PA/NP OP Progress Note  06/08/2024 11:14 AM Adam Oneal  MRN:  [id number]  Chief Complaint:  Chief Complaint  Patient presents with   Depression   Anxiety   Manic Behavior   Follow-up   HPI: This is a 63 year old widowed white male who lives alone in [place].  He is retired from the [employer].  The patient returns for follow-up after 6 months regarding his bipolar disorder.  He states that his mood has been stable.  He has been spending time going on walks and watching TV.  He has gained a little weight.  He claims he is pretty much quit drinking alcohol.  All of his recent labs were within normal limits including TSH.  His last lithium  level was 0.7.  He denies any manic symptoms such as racing thoughts difficulty sleeping or impulsive behaviors. Visit Diagnosis:    ICD-10-CM   1. Bipolar 1 disorder, mixed, moderate (HCC)  F31.62       Past Psychiatric History: Long-term outpatient treatment for bipolar disorder  Past Medical History:  Past Medical History:  Diagnosis Date   Acid reflux    Anxiety 01/1998   Cataract 02/2004   Depression 01/1998   Diabetes mellitus without complication (HCC) 12/1999   Diabetes mellitus, type II (HCC)    Hyperlipidemia 11/2006   Hypertension    Hypertension    History reviewed. No pertinent surgical history.  Family Psychiatric History: See below  Family History:  Family History  Problem Relation Age of Onset   Cancer Mother 57       lung   Heart disease Father    Hyperlipidemia Father    Diabetes Brother    Leukemia Brother    Diabetes Paternal Grandmother    Heart disease Paternal Grandmother    Alcohol abuse Paternal Grandfather    Diabetes Brother    Depression Maternal Aunt     Social History:  Social History   Socioeconomic History   Marital status: Widowed    Spouse name: Not on file   Number of children: Not on file   Years of education: Not on file   Highest education level: Some college, no  degree  Occupational History   Not on file  Tobacco Use   Smoking status: Former    Current packs/day: 0.00    Average packs/day: 1 pack/day for 12.0 years (12.0 ttl pk-yrs)    Types: Cigarettes    Start date: 01/05/1978    Quit date: 12/28/1989    Years since quitting: 34.4   Smokeless tobacco: Former  Building Services Engineer status: Never Used  Substance and Sexual Activity   Alcohol use: No    Alcohol/week: 0.0 standard drinks of alcohol    Comment: 03-26-16 per pt no   Drug use: No    Comment: 03-26-16 per pt currently no but use to   Sexual activity: Never  Other Topics Concern   Not on file  Social History Narrative   Not on file   Social Drivers of Health   Financial Resource Strain: Medium Risk (01/26/2024)   Overall Financial Resource Strain (CARDIA)    Difficulty of Paying Living Expenses: Somewhat hard  Food Insecurity: Food Insecurity Present (01/26/2024)   Hunger Vital Sign    Worried About Running Out of Food in the Last Year: Sometimes true    Ran Out of Food in the Last Year: Patient declined  Transportation Needs: No Transportation Needs (01/26/2024)   PRAPARE - Transportation  Lack of Transportation (Medical): No    Lack of Transportation (Non-Medical): No  Physical Activity: Insufficiently Active (01/26/2024)   Exercise Vital Sign    Days of Exercise per Week: 2 days    Minutes of Exercise per Session: 20 min  Stress: No Stress Concern Present (01/26/2024)   [employer] of Occupational Health - Occupational Stress Questionnaire    Feeling of Stress: Not at all  Social Connections: Socially Isolated (01/26/2024)   Social Connection and Isolation Panel    Frequency of Communication with Friends and Family: Never    Frequency of Social Gatherings with Friends and Family: Never    Attends Religious Services: Never    Database Administrator or Organizations: No    Attends Engineer, Structural: Not on file    Marital Status: Widowed    Allergies:   Allergies  Allergen Reactions   Lipitor [email or web address].[email or web address] ] Shortness Of Breath    Metabolic Disorder Labs: Lab Results  Component Value Date   HGBA1C 6.8 (H) 05/01/2024   No results found for: PROLACTIN Lab Results  Component Value Date   CHOL 180 05/01/2024   TRIG 139 05/01/2024   HDL 73 05/01/2024   CHOLHDL 2.5 05/01/2024   LDLCALC 83 05/01/2024   LDLCALC 81 01/30/2024   Lab Results  Component Value Date   TSH 2.740 05/01/2024   TSH 1.860 01/03/2023    Therapeutic Level Labs: Lab Results  Component Value Date   LITHIUM  0.6 01/30/2024   LITHIUM  0.8 01/03/2023   No results found for: VALPROATE No results found for: CBMZ  Current Medications: Current Outpatient Medications  Medication Sig Dispense Refill   Continuous Glucose Receiver (FREESTYLE LIBRE 3 READER) DEVI 1 each by Does not apply route 2 (two) times daily. 1 each 1   Continuous Glucose Sensor (FREESTYLE LIBRE 3 PLUS SENSOR) MISC Change sensor every 15 days. 2 each 11   cyclobenzaprine  (FLEXERIL ) 10 MG tablet Take 1 tablet (10 mg total) by mouth 3 (three) times daily as needed for muscle spasms. 270 tablet 3   empagliflozin  (JARDIANCE ) 25 MG TABS tablet Take 1 tablet (25 mg total) by mouth daily before breakfast. 90 tablet 3   fluticasone  (FLONASE ) 50 MCG/ACT nasal spray USE 1 SPRAY IN EACH NOSTRIL DAILY 48 g 1   lisinopril  (ZESTRIL ) 10 MG tablet Take 1 tablet (10 mg total) by mouth daily. 90 tablet 3   metFORMIN  (GLUCOPHAGE ) 1000 MG tablet Take 1 tablet (1,000 mg total) by mouth 2 (two) times daily with a meal. 180 tablet 3   tamsulosin  (FLOMAX ) 0.4 MG CAPS capsule Take 1 capsule (0.4 mg total) by mouth daily. 90 capsule 3   DULoxetine  (CYMBALTA ) 60 MG capsule Take 1 capsule (60 mg total) by mouth daily. 90 capsule 2   hydrOXYzine  (ATARAX ) 25 MG tablet Take 1 tablet (25 mg total) by mouth every 8 (eight) hours as needed for anxiety. 90 tablet 2   lithium  carbonate (ESKALITH ) 450 MG ER tablet Take 2 tablets  (900 mg total) by mouth every morning. 180 tablet 3   No current facility-administered medications for this visit.     Musculoskeletal: Strength & Muscle Tone: within normal limits Gait & Station: normal Patient leans: N/A  Psychiatric Specialty Exam: Review of Systems  All other systems reviewed and are negative.   Blood pressure (!) 147/91, pulse 74, height 6' (1.829 m), weight 221 lb 12.8 oz (100.6 kg).Body mass index is 30.08 kg/m.  General Appearance: Casual and Fairly Groomed  Eye  Contact:  Good  Speech:  Clear and Coherent  Volume:  Normal  Mood:  Euthymic  Affect:  Congruent  Thought Process:  Goal Directed  Orientation:  Full (Time, Place, and Person)  Thought Content: WDL   Suicidal Thoughts:  No  Homicidal Thoughts:  No  Memory:  Immediate;   Good Recent;   Good Remote;   Good  Judgement:  Good  Insight:  Fair  Psychomotor Activity:  Normal  Concentration:  Concentration: Good and Attention Span: Good  Recall:  Good  Fund of Knowledge: Good  Language: Good  Akathisia:  No  Handed:  Right  AIMS (if indicated): not done  Assets:  Communication Skills Desire for Improvement Physical Health Resilience Social Support Talents/Skills  ADL's:  Intact  Cognition: WNL  Sleep:  Good   Screenings: AUDIT    Flowsheet Row Office Visit from 01/30/2024 in [facility] Office Visit from 01/03/2023 in [facility]  Alcohol Use Disorder Identification Test Final Score (AUDIT) 3  6   GAD-7    Flowsheet Row Office Visit from 05/01/2024 in [facility] Office Visit from 01/30/2024 in [facility] Office Visit from 10/28/2023 in [facility] Office Visit from 07/29/2023 in [facility] Office Visit from 04/10/2023 in [facility]  Total GAD-7  Score 1 0 0 3 0   PHQ2-9    Flowsheet Row Office Visit from 05/01/2024 in [facility] Office Visit from 01/30/2024 in [facility] Office Visit from 10/28/2023 in [facility] Office Visit from 07/29/2023 in [facility] Office Visit from 04/10/2023 in [facility]  PHQ-2 Total Score 1 0 0 2 0  PHQ-9 Total Score 6 0 0 5 0   Flowsheet Row Video Visit from 02/21/2022 in [facility] [place] Video Visit from 10/26/2021 in [facility] [place] Video Visit from 06/02/2021 in [facility] [place]  C-SSRS RISK CATEGORY No Risk No Risk No Risk     Assessment and Plan: This patient is a 63 year old male with a history of bipolar disorder type I.  He continues to do well on his current regimen.  He will continue lithium  carbonate 900 mg daily for mood stabilization, Cymbalta  60 mg daily for depression and hydroxyzine  25 mg daily as needed for sleep or anxiety.  He will return to see me in 6 months.  Collaboration of Care: Collaboration of Care: Primary Care Provider AEB notes are shared with PCP on the epic system  Patient/Guardian was advised Release of Information must be obtained prior to any record release in order to collaborate their care with an outside provider. Patient/Guardian was advised if they have not already done so to contact the registration department to sign all necessary forms in order for us  to release information regarding their care.   Consent: Patient/Guardian gives verbal consent for treatment and assignment of benefits for services provided during this visit. Patient/Guardian expressed understanding and agreed to proceed.    Barnie Gull, MD 06/08/2024, 11:14 AM

## 2024-06-30 ENCOUNTER — Telehealth: Payer: Self-pay | Admitting: Family Medicine

## 2024-06-30 NOTE — Telephone Encounter (Unsigned)
 Copied from CRM #[id number]. Topic: Clinical - Medication Refill >> Jun 30, 2024  3:40 PM Kevelyn M wrote: Medication: cyclobenzaprine  (FLEXERIL ) 10 MG tablet  Has the patient contacted their pharmacy? Yes (Agent: If no, request that the patient contact the pharmacy for the refill. If patient does not wish to contact the pharmacy document the reason why and proceed with request.) (Agent: If yes, when and what did the pharmacy advise?)  This is the patient's preferred pharmacy:  [facility] [address] [place], [place] [address]  [phone]  Is this the correct pharmacy for this prescription? Yes If no, delete pharmacy and type the correct one.   Has the prescription been filled recently? No  Is the patient out of the medication? Yes  Has the patient been seen for an appointment in the last year OR does the patient have an upcoming appointment? Yes  Can we respond through MyChart? Yes  Agent: Please be advised that Rx refills may take up to 3 business days. We ask that you follow-up with your pharmacy.

## 2024-08-20 ENCOUNTER — Telehealth: Payer: Self-pay | Admitting: Family Medicine

## 2024-08-20 NOTE — Telephone Encounter (Signed)
 Patient needs WWTM before 03-29-25 with PCP.

## 2024-09-03 ENCOUNTER — Other Ambulatory Visit (HOSPITAL_COMMUNITY): Payer: Self-pay | Admitting: Psychiatry

## 2024-09-03 ENCOUNTER — Telehealth (HOSPITAL_COMMUNITY): Payer: Self-pay

## 2024-09-03 MED ORDER — LITHIUM CARBONATE ER 450 MG PO TBCR: 900.0000 mg | EXTENDED_RELEASE_TABLET | ORAL | 3 refills | Status: AC

## 2024-09-03 NOTE — Telephone Encounter (Signed)
 Pt [facility] in stating that [facility] not [facility] his lithium  carbonate (ESKALITH ) 450 MG ER tablet and [facility] [place] [facility]07[facility]24. Please advise,

## 2024-09-03 NOTE — Telephone Encounter (Signed)
 Pt aware.

## 2024-09-03 NOTE — Telephone Encounter (Signed)
 sent

## 2024-09-11 ENCOUNTER — Ambulatory Visit: Admitting: Family Medicine

## 2024-12-07 ENCOUNTER — Ambulatory Visit (HOSPITAL_COMMUNITY): Admitting: Psychiatry
